# Patient Record
Sex: Male | Born: 1950 | Race: White | Hispanic: No | Marital: Married | Smoking: Former smoker
Health system: Southern US, Community
[De-identification: ages and names within clinical notes are randomized; demographics above are authoritative.]

## PROBLEM LIST (undated history)

## (undated) DIAGNOSIS — I10 Essential (primary) hypertension: Secondary | ICD-10-CM

## (undated) DIAGNOSIS — G4730 Sleep apnea, unspecified: Secondary | ICD-10-CM

## (undated) DIAGNOSIS — K429 Umbilical hernia without obstruction or gangrene: Secondary | ICD-10-CM

## (undated) DIAGNOSIS — Z889 Allergy status to unspecified drugs, medicaments and biological substances status: Secondary | ICD-10-CM

## (undated) DIAGNOSIS — F329 Major depressive disorder, single episode, unspecified: Secondary | ICD-10-CM

## (undated) DIAGNOSIS — E785 Hyperlipidemia, unspecified: Secondary | ICD-10-CM

## (undated) DIAGNOSIS — N419 Inflammatory disease of prostate, unspecified: Secondary | ICD-10-CM

## (undated) DIAGNOSIS — F32A Depression, unspecified: Secondary | ICD-10-CM

## (undated) HISTORY — DX: Hyperlipidemia, unspecified: E78.5

## (undated) HISTORY — PX: CATARACT EXTRACTION: SUR2

## (undated) HISTORY — DX: Sleep apnea, unspecified: G47.30

## (undated) HISTORY — DX: Essential (primary) hypertension: I10

## (undated) HISTORY — PX: EYE SURGERY: SHX253

## (undated) HISTORY — PX: COLONOSCOPY: SHX174

## (undated) HISTORY — PX: TONSILLECTOMY: SUR1361

## (undated) HISTORY — DX: Umbilical hernia without obstruction or gangrene: K42.9

## (undated) HISTORY — PX: HERNIA REPAIR: SHX51

## (undated) HISTORY — PX: APPENDECTOMY: SHX54

---

## 1998-02-18 ENCOUNTER — Ambulatory Visit (HOSPITAL_COMMUNITY): Admission: RE | Admit: 1998-02-18 | Discharge: 1998-02-18 | Payer: Self-pay | Admitting: Internal Medicine

## 1998-02-18 ENCOUNTER — Encounter: Payer: Self-pay | Admitting: Internal Medicine

## 1999-02-18 ENCOUNTER — Encounter: Payer: Self-pay | Admitting: Emergency Medicine

## 1999-02-18 ENCOUNTER — Emergency Department (HOSPITAL_COMMUNITY): Admission: EM | Admit: 1999-02-18 | Discharge: 1999-02-18 | Payer: Self-pay | Admitting: Emergency Medicine

## 2005-12-10 ENCOUNTER — Inpatient Hospital Stay (HOSPITAL_COMMUNITY): Admission: AD | Admit: 2005-12-10 | Discharge: 2005-12-13 | Payer: Self-pay | Admitting: Interventional Cardiology

## 2009-02-14 ENCOUNTER — Ambulatory Visit (HOSPITAL_COMMUNITY): Admission: RE | Admit: 2009-02-14 | Discharge: 2009-02-14 | Payer: Self-pay | Admitting: Gastroenterology

## 2010-07-05 ENCOUNTER — Encounter: Payer: Self-pay | Admitting: Interventional Cardiology

## 2010-10-30 NOTE — Discharge Summary (Signed)
NAMERONDELL, PARDON NO.:  [id number]   MEDICAL RECORD NO.:  [id number]          PATIENT TYPE:  INP   LOCATION:  2036                         FACILITY:  [facility]   PHYSICIAN:  Lyn Records, M.D.   DATE OF BIRTH:  [birth year]   DATE OF ADMISSION:  12/10/2005  DATE OF DISCHARGE:  12/13/2005                                 DISCHARGE SUMMARY   DISCHARGE DIAGNOSES:  1.  Chest pain, felt to be noncardiac in nature.  2.  Hypertension.  3.  Hyperlipidemia.  4.  Depression.  5.  Asthma.  6.  Longterm medication use.   HISTORY AND HOSPITAL COURSE:  Scott Young is a 60 year old male patient who  had 6-month history of chest tightness on exertion as well as at rest.  He  was sent by his primary care physician for a Cardiolite that was negative  for ischemia but he did have exercise induced chest pain.  We were going to  schedule an outpatient cardiac catheterization, however, the patient had  increasing symptoms.  Therefore he was admitted on December 10, 2005 for IV  medication management including heparin and nitroglycerin.  The patient  ultimately had a cardiac catheterization on December 13, 2005 and was found to  have angiographically normal coronary arteries with a normal EF of 60%.  The  patient was closed with Angio-Seal and ready for discharge to home later  that day.   DISCHARGE CONDITION AND MEDICATIONS:  He was discharged to home in stable  condition on the following medications:  1.  Toprol was discontinued.  2.  Prozac as prior to admission.  3.  Altace 10 mg a day.  4.  Coated aspirin 325 mg a day.  5.  Lipitor 10 mg a day.   FOLLOWUP:  He is not to follow up with Dr. Katrinka Blazing unless he has difficulty  with his groin.  He is to follow up with Dr. Earl Gala in the 2 weeks.   DISCHARGE INSTRUCTIONS:  No driving for 2 days.  No lifting over 10 pounds  for 1 week.  Clean cath site gently with soap and water, no scrubbing.  The  patient is discharged  home in stable  condition.      [name], P.A.      Lyn Records, M.D.  Electronically Signed    LB/MEDQ  D:  12/13/2005  T:  12/13/2005  Job:  [id number]

## 2010-10-30 NOTE — Cardiovascular Report (Signed)
NAMEMarland Kitchen  Scott Young, Scott Young NO.:  [id number]   MEDICAL RECORD NO.:  [id number]          PATIENT TYPE:  INP   LOCATION:  2036                         FACILITY:  [facility]   PHYSICIAN:  Lyn Records, M.D.   DATE OF BIRTH:  14-Dec-1950   DATE OF PROCEDURE:  12/13/2005  DATE OF DISCHARGE:                              CARDIAC CATHETERIZATION   INDICATION FOR STUDY:  Exertional and rest chest discomfort compatible with  unstable angina in this 61 year old gentleman with hypertension, history of  depression, and positive family history of coronary disease.   PROCEDURE PERFORMED:  1.  Left heart catheterization.  2.  Selective coronary angiography.  3.  Left ventriculography.  4.  Angio-Seal arteriotomy closure.   DESCRIPTION:  After informed consent a 6-French sheath was placed in the  right femoral artery using a modified Seldinger technique.  A 6-French A2  multipurpose catheter was then used for hemodynamic recordings, left  ventriculography by hand injection, and selective left and right coronary  angiography.  Right coronary angiography was eventually accomplish with a  Judkins right #4 6-French catheter.  Angio-Seal arteriotomy closure was  performed postprocedure with good hemostasis.   RESULTS:  1.  Hemodynamic data:      1.  Aortic pressure 121/72.      2.  Left ventricular pressure 114/17.  2.  Left ventriculography:  LV cavity is faintly opacified.  Overall      function is felt to be normal.  EF 60%.  No regional wall motion      abnormality.  3.  Coronary angiography.      1.  Left main coronary:  Widely patent.      2.  Left anterior descending coronary:  The LAD is widely patent, gives          origin to three diagonal branches.  All are patent.      3.  Circumflex artery:  The circumflex is a moderate size vessel that          gives origin to three obtuse marginals.  Luminal irregularities are          noted in the mid circumflex.  The vessel is widely  patent.      4.  Right coronary:  The right coronary artery is dominant and is          normal.  Gives origin to PDA and two small left ventricular          branches.   CONCLUSIONS:  1.  Essentially normal coronary arteries.  2.  Normal left ventricular function.   PLAN:  Monitor groin.  Home later today if good hemostasis.  Consider GI  workup for chest discomfort.  No further cardiac evaluation.      Lyn Records, M.D.  Electronically Signed     HWS/MEDQ  D:  12/13/2005  T:  12/13/2005  Job:  [id number]   cc:   Theressa Millard, M.D.  Fax: [phone]

## 2010-10-30 NOTE — H&P (Signed)
NAMEMarland Kitchen  ISAC, LINCKS NO.:  [id number]   MEDICAL RECORD NO.:  [id number]          PATIENT TYPE:  INP   LOCATION:  2036                         FACILITY:  [facility]   PHYSICIAN:  Lyn Records, M.D.   DATE OF BIRTH:  22-Jun-1950   DATE OF ADMISSION:  12/10/2005  DATE OF DISCHARGE:                                HISTORY & PHYSICAL   CHIEF COMPLAINT:  Chest pain at rest.   Scott Young is a 60 year old male patient who has a 41-month history of chest  tightness on exertion as well as chest pain at rest. He complains of his  most severe episode on December 09, 2005 while eating lunch. His chest pain was  associated with dyspnea. Spontaneously the pain resolved. Over the past 24  hours he has had recurrent pain. He was encouraged to be seen in the office  by Dr. Katrinka Blazing today, December 10, 2005.   Dr. Earl Gala had ordered a 2-day stress Cardiolite exercise test on December 07, 2005 and there was no evidence of infarction or ischemia, EF was 65%. There  was some basal inferior wall diaphragmatic attenuation although not  suspicious for ischemia. Dr. Katrinka Blazing did state that if the patient did have  chest pain despite the study, we would need to consider coronary  angiography. Since the patient has what appears to be unstable angina  pectoris, we are going to schedule him for cardiac catheterization on December 13, 2005 under the care of Dr. Katrinka Blazing.   PAST MEDICAL HISTORY:  Hypertension, depression, asthma, long-term  medication use, hyperlipidemia.   ALLERGIES:  No known drug allergies.   SOCIAL HISTORY:  No tobacco or alcohol use, he is married.   MEDICATIONS:  1.  Prozac twice weekly.  2.  Altace 2 mg a day.  3.  Toprol XL 25 mg a day.  4.  Enteric coated aspirin 325 mg a day.  5.  Lipitor 10 mg a day.   REVIEW OF SYSTEMS:  No GERD, no recent weight loss, otherwise as above.   PHYSICAL EXAMINATION:  VITAL SIGNS:  Blood pressure 124/80, pulse 55.  HEENT:  Grossly normal, no carotid or  subclavian bruits, JVD or thyromegaly.  Sclera clear, conjunctiva normal, nose without drainage.  CHEST:  Clear to auscultation bilaterally, no wheezes or rhonchi.  HEART:  Regular rate and rhythm. No gross murmur, positive S4.  ABDOMEN:  Soft, nontender, no masses.  EXTREMITIES:  No peripheral edema, palpable lower extremity pulses  bilaterally.  NEUROLOGIC:  The patient is alert and oriented x3.  SKIN:  Warm and dry.   EKG shows sinus bradycardia rate of 53 with no acute ST-T wave changes.   ASSESSMENT:  1.  Progressive angina pectoris even while at rest, with increasing      symptoms. Suspect underlying coronary artery disease.  2.  Hypertension.  3.  Hyperlipidemia.  4.  Depression.  5.  Asthma.  6.  Long-term medication use.   The patient is admitted to the telemetry floor, cardiac catheterization on  Monday. Start IV heparin and serial cardiac isoenzymes.  Scott Young, P.A.      Lyn Records, M.D.  Electronically Signed    LB/MEDQ  D:  12/10/2005  T:  12/10/2005  Job:  [id number]   cc:   Theressa Millard, M.D.  Fax: [phone]

## 2011-09-16 ENCOUNTER — Ambulatory Visit (INDEPENDENT_AMBULATORY_CARE_PROVIDER_SITE_OTHER): Payer: BC Managed Care – PPO | Admitting: General Surgery

## 2011-09-23 ENCOUNTER — Encounter (INDEPENDENT_AMBULATORY_CARE_PROVIDER_SITE_OTHER): Payer: Self-pay | Admitting: General Surgery

## 2011-09-27 ENCOUNTER — Encounter (INDEPENDENT_AMBULATORY_CARE_PROVIDER_SITE_OTHER): Payer: Self-pay | Admitting: General Surgery

## 2011-09-27 ENCOUNTER — Ambulatory Visit (INDEPENDENT_AMBULATORY_CARE_PROVIDER_SITE_OTHER): Payer: PRIVATE HEALTH INSURANCE | Admitting: General Surgery

## 2011-09-27 ENCOUNTER — Telehealth (INDEPENDENT_AMBULATORY_CARE_PROVIDER_SITE_OTHER): Payer: Self-pay

## 2011-09-27 VITALS — BP 124/80 | HR 70 | Temp 98.1°F | Ht 73.0 in | Wt 304.6 lb

## 2011-09-27 DIAGNOSIS — K420 Umbilical hernia with obstruction, without gangrene: Secondary | ICD-10-CM

## 2011-09-27 NOTE — Patient Instructions (Signed)
You have an umbilical hernia. There is no immediate danger, but I cannot completely push this back in.  We will plan to repair your umbilical hernia with mesh this summer.  I will like to see you in the office one to 3 weeks preop for a preop evaluation. Please call  when you know when you want to schedule surgery.

## 2011-09-27 NOTE — Progress Notes (Signed)
Patient ID: Scott Young, male   DOB: 03-Apr-1951, 61 y.o.   MRN: [id number]  Chief Complaint  Patient presents with  . Pre-op Exam    eval hernia    HPI Scott Young is a 61 y.o. male.  He is referred by Dr. Theressa Millard for evaluation of a painful umbilical hernia.  The patient has never had a hernia problem in the past. He has noticed a bulge in his umbilicus for about one year. He says he thinks is getting larger and is becoming somewhat uncomfortable. No history of diffuse abdominal pain or vomiting.  He saw Dr. Earl Gala who referred him for my evaluation.  Comorbidities include obesity ( BMI 40), obstructive sleep apnea, hypertension, hyperlipidemia , but negative cardiac evaluation in the past. HPI  Past Medical History  Diagnosis Date  . Sleep apnea   . Hyperlipidemia   . Hypertension     Past Surgical History  Procedure Date  . Appendectomy   . Tonsillectomy   . Eye surgery     Family History  Problem Relation Age of Onset  . Heart disease Mother   . Cancer Father     colon    Social History History  Substance Use Topics  . Smoking status: Former Games developer  . Smokeless tobacco: Former Neurosurgeon    Quit date: 09/23/1970  . Alcohol Use: No    Allergies  Allergen Reactions  . Codeine     Current Outpatient Prescriptions  Medication Sig Dispense Refill  . aspirin 81 MG tablet Take 81 mg by mouth daily.      . fexofenadine (ALLEGRA) 180 MG tablet Take 180 mg by mouth daily.      Marland Kitchen FLUoxetine (PROZAC) 40 MG capsule Take 40 mg by mouth daily.      Marland Kitchen losartan (COZAAR) 100 MG tablet Take 100 mg by mouth daily.        Review of Systems Review of Systems  Constitutional: Negative for fever, chills and unexpected weight change.  HENT: Negative for hearing loss, congestion, sore throat, trouble swallowing and voice change.   Eyes: Negative for visual disturbance.  Respiratory: Positive for cough. Negative for wheezing.   Cardiovascular: Negative for chest  pain, palpitations and leg swelling.  Gastrointestinal: Positive for abdominal pain and anal bleeding. Negative for nausea, vomiting, diarrhea, constipation, blood in stool, abdominal distention and rectal pain.  Genitourinary: Negative for hematuria and difficulty urinating.  Musculoskeletal: Negative for arthralgias.  Skin: Negative for rash and wound.  Neurological: Negative for seizures, syncope, weakness and headaches.  Hematological: Negative for adenopathy. Does not bruise/bleed easily.  Psychiatric/Behavioral: Negative for confusion.    Blood pressure 124/80, pulse 70, temperature 98.1 F (36.7 C), temperature source Temporal, height 6\' 1"  (1.854 m), weight 304 lb 9.6 oz (138.166 kg), SpO2 97.00%.  Physical Exam Physical Exam  Constitutional: He is oriented to person, place, and time. He appears well-developed and well-nourished. No distress.       obese  HENT:  Head: Normocephalic.  Nose: Nose normal.  Mouth/Throat: No oropharyngeal exudate.  Eyes: Conjunctivae and EOM are normal. Pupils are equal, round, and reactive to light. Right eye exhibits no discharge. Left eye exhibits no discharge. No scleral icterus.  Neck: Normal range of motion. Neck supple. No JVD present. No tracheal deviation present. No thyromegaly present.  Cardiovascular: Normal rate, regular rhythm, normal heart sounds and intact distal pulses.   No murmur heard. Pulmonary/Chest: Effort normal and breath sounds normal. No stridor. No respiratory distress.  He has no wheezes. He has no rales. He exhibits no tenderness.  Abdominal: Soft. Bowel sounds are normal. He exhibits no distension and no mass. There is no tenderness. There is no rebound and no guarding.       Small umbilical hernia, hernia sac 2 cm. Partially but not completely reducible. No evidence of any other abdominal or inguinal hernia or mass. Well-healed right lower quadrant scar from prior appendectomy.  Musculoskeletal: Normal range of motion. He  exhibits no edema and no tenderness.  Lymphadenopathy:    He has no cervical adenopathy.  Neurological: He is alert and oriented to person, place, and time. He has normal reflexes. Coordination normal.  Skin: Skin is warm and dry. No rash noted. He is not diaphoretic. No erythema. No pallor.  Psychiatric: He has a normal mood and affect. His behavior is normal. Judgment and thought content normal.    Data Reviewed I reviewed Dr. Newell Coral office notes.  Assessment    Incarcerated umbilical hernia, small, no apparent complications to date other than pain.  Obesity, BMI 40  Hyperlipidemia   hypertension  Former smoker  History cardiac evaluation 2007 and 2009, essentially negative.    Plan    I discussed the indications, details, techniques, and risks of umbilical hernia repair with mesh with the patient. I told him that eventually this would need to be done as the hernia would almost certainly progress.  He wants  do the surgery this summer and that is very reasonable unless symptoms become worse.   He will call back when he knows when he wants to schedule surgery.  We will see him in the office one to 3 weeks preoperative for a  final exam.       Angelia Mould. Derrell Lolling, M.D., [facility] General and Minimally invasive Surgery Breast and Colorectal Surgery Office:   [phone] Pager:   [phone]  09/27/2011, 4:16 PM

## 2011-09-27 NOTE — Telephone Encounter (Signed)
Surgery orders are written for [facility] and given to Debbie E for to call back file. Pt to call back to have surgery in summer. Pt needs short ov for reck 2 weeks before surgery.

## 2011-11-25 ENCOUNTER — Encounter (INDEPENDENT_AMBULATORY_CARE_PROVIDER_SITE_OTHER): Payer: Self-pay | Admitting: General Surgery

## 2011-11-25 ENCOUNTER — Ambulatory Visit (INDEPENDENT_AMBULATORY_CARE_PROVIDER_SITE_OTHER): Payer: PRIVATE HEALTH INSURANCE | Admitting: General Surgery

## 2011-11-25 VITALS — BP 130/76 | HR 70 | Temp 97.3°F | Resp 14 | Ht 72.0 in | Wt 297.1 lb

## 2011-11-25 DIAGNOSIS — K420 Umbilical hernia with obstruction, without gangrene: Secondary | ICD-10-CM

## 2011-11-25 NOTE — Patient Instructions (Signed)
Your umbilical hernia is relatively small, but there appears to be entrapped fat present. It doesn't appear much different than the last time I saw you in the office.  We will proceed with surgery to repair your  umbilical hernia with mesh on June 28 as scheduled.

## 2011-11-25 NOTE — Progress Notes (Signed)
Patient ID: Scott Young, male   DOB: [birth year], 61 y.o.   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 He is scheduled for elective repair of his umbilical hernia with mesh on June 28.  He states that it seems to protrude a little bit more but is really not having  much pain.  On exam, he is in no distress. Abdomen is morbidly obese. Small umbilical hernia that is not reducible. No infection. Seems to be incarcerated preperitoneal fat.  Assessment: Incarcerated umbilical hernia containing fat only, minimally minimal asymptomatic  Plan: Proceed with surgery to repair his umbilical  hernia with mesh on June 28.   Angelia Mould. Derrell Lolling, M.D., [facility] General and Minimally invasive Surgery Breast and Colorectal Surgery Office:   [phone] Pager:   [phone]

## 2011-12-06 ENCOUNTER — Other Ambulatory Visit (INDEPENDENT_AMBULATORY_CARE_PROVIDER_SITE_OTHER): Payer: Self-pay | Admitting: General Surgery

## 2011-12-07 LAB — COMPREHENSIVE METABOLIC PANEL
ALT: 16 U/L (ref 0–53)
AST: 18 U/L (ref 0–37)
Albumin: 4.3 g/dL (ref 3.5–5.2)
Alkaline Phosphatase: 85 U/L (ref 39–117)
BUN: 13 mg/dL (ref 6–23)
CO2: 31 mEq/L (ref 19–32)
Calcium: 9.6 mg/dL (ref 8.4–10.5)
Chloride: 101 mEq/L (ref 96–112)
Creat: 1.2 mg/dL (ref 0.50–1.35)
Glucose, Bld: 112 mg/dL — ABNORMAL HIGH (ref 70–99)
Potassium: 4.8 mEq/L (ref 3.5–5.3)
Sodium: 138 mEq/L (ref 135–145)
Total Bilirubin: 0.6 mg/dL (ref 0.3–1.2)
Total Protein: 7.1 g/dL (ref 6.0–8.3)

## 2011-12-07 LAB — URINALYSIS, ROUTINE W REFLEX MICROSCOPIC
Bilirubin Urine: NEGATIVE
Glucose, UA: NEGATIVE mg/dL
Hgb urine dipstick: NEGATIVE
Ketones, ur: NEGATIVE mg/dL
Leukocytes, UA: NEGATIVE
Nitrite: NEGATIVE
Protein, ur: NEGATIVE mg/dL
Specific Gravity, Urine: 1.01 (ref 1.005–1.030)
Urobilinogen, UA: 0.2 mg/dL (ref 0.0–1.0)
pH: 6.5 (ref 5.0–8.0)

## 2011-12-10 DIAGNOSIS — K420 Umbilical hernia with obstruction, without gangrene: Secondary | ICD-10-CM

## 2011-12-13 ENCOUNTER — Telehealth (INDEPENDENT_AMBULATORY_CARE_PROVIDER_SITE_OTHER): Payer: Self-pay | Admitting: General Surgery

## 2011-12-13 NOTE — Telephone Encounter (Signed)
Pt is calling to report redness and sensitivity to the tape used to bandage his incision.  There is redness that resembles a sunburn, but not tenderness or itching.  It is located exactly where the tape was, about 4" out from the incision itself.  Advised pt to monitor it for now and call back if worsens or he becomes concerned.  He understands and will comply.

## 2011-12-20 ENCOUNTER — Telehealth (INDEPENDENT_AMBULATORY_CARE_PROVIDER_SITE_OTHER): Payer: Self-pay | Admitting: General Surgery

## 2011-12-20 NOTE — Telephone Encounter (Signed)
Patient called in pertaining to appointment message left last week. I advised the patient that I had to obtain approval from Dr. Derrell Lolling to add him on a specific day and that his message was not forgotten. Appointment made on 01/05/12 at 2:15 before Dr. Jacinto Halim urgent office that day.

## 2012-01-05 ENCOUNTER — Encounter (INDEPENDENT_AMBULATORY_CARE_PROVIDER_SITE_OTHER): Payer: Self-pay | Admitting: General Surgery

## 2012-01-05 ENCOUNTER — Ambulatory Visit (INDEPENDENT_AMBULATORY_CARE_PROVIDER_SITE_OTHER): Payer: PRIVATE HEALTH INSURANCE | Admitting: General Surgery

## 2012-01-05 VITALS — BP 144/78 | HR 76 | Temp 97.8°F | Resp 16 | Ht 73.0 in | Wt 295.0 lb

## 2012-01-05 DIAGNOSIS — K420 Umbilical hernia with obstruction, without gangrene: Secondary | ICD-10-CM

## 2012-01-05 NOTE — Progress Notes (Signed)
Subjective:     Patient ID: Scott Young, male   DOB: 04/25/1951, 61 y.o.   MRN: [id number]  HPI This gentleman underwent open repair of incarcerated umbilical hernia with a 4.3 cm Proceed disc on 12/10/2011. He has done well. He really doesn't have any pain or wound problems. He wants to know when he can resume normal physical activities.  Review of Systems     Objective:   Physical Exam Very pleasant gentleman. Obese. In no distress.  Abdomen obese. Soft. Nontender. Transverse incision below umbilicus well healed. No evidence of recurrent hernia.    Assessment:     Incarcerated umbilical hernia, on it for recovery following repair with mesh    Plan:     Resume normal activities without restriction after August 1.  Return to see me when necessary   Angelia Mould. Derrell Lolling, M.D., [facility] General and Minimally invasive Surgery Breast and Colorectal Surgery Office:   [phone] Pager:   [phone]

## 2012-01-05 NOTE — Patient Instructions (Signed)
Your  umbilical hernia incision appears to be healing without any obvious complications. You may resume all normal activities without restriction after August 1. Return to see Dr. Derrell Lolling if any further problems arise.

## 2014-08-08 ENCOUNTER — Other Ambulatory Visit: Payer: Self-pay | Admitting: Gastroenterology

## 2014-09-30 DIAGNOSIS — R7309 Other abnormal glucose: Secondary | ICD-10-CM | POA: Diagnosis not present

## 2014-10-15 ENCOUNTER — Encounter (HOSPITAL_COMMUNITY): Payer: Self-pay | Admitting: *Deleted

## 2014-10-16 DIAGNOSIS — G4733 Obstructive sleep apnea (adult) (pediatric): Secondary | ICD-10-CM | POA: Diagnosis not present

## 2014-10-16 DIAGNOSIS — R7301 Impaired fasting glucose: Secondary | ICD-10-CM | POA: Diagnosis not present

## 2014-10-16 DIAGNOSIS — I10 Essential (primary) hypertension: Secondary | ICD-10-CM | POA: Diagnosis not present

## 2014-10-16 DIAGNOSIS — E663 Overweight: Secondary | ICD-10-CM | POA: Diagnosis not present

## 2014-10-16 DIAGNOSIS — E780 Pure hypercholesterolemia: Secondary | ICD-10-CM | POA: Diagnosis not present

## 2014-10-16 DIAGNOSIS — Z6841 Body Mass Index (BMI) 40.0 and over, adult: Secondary | ICD-10-CM | POA: Diagnosis not present

## 2014-10-22 ENCOUNTER — Encounter (HOSPITAL_COMMUNITY): Payer: Self-pay | Admitting: Anesthesiology

## 2014-10-22 ENCOUNTER — Ambulatory Visit (HOSPITAL_COMMUNITY): Payer: Medicare Other | Admitting: Anesthesiology

## 2014-10-22 ENCOUNTER — Ambulatory Visit (HOSPITAL_COMMUNITY)
Admission: RE | Admit: 2014-10-22 | Discharge: 2014-10-22 | Disposition: A | Discharge status: Home / Self Care | Payer: Medicare Other | Source: Ambulatory Visit | Attending: Gastroenterology | Admitting: Gastroenterology

## 2014-10-22 ENCOUNTER — Encounter (HOSPITAL_COMMUNITY)
Admission: RE | Disposition: A | Discharge status: Home / Self Care | Payer: Self-pay | Source: Ambulatory Visit | Attending: Gastroenterology

## 2014-10-22 DIAGNOSIS — Z800 Family history of malignant neoplasm of digestive organs: Secondary | ICD-10-CM | POA: Diagnosis not present

## 2014-10-22 DIAGNOSIS — I10 Essential (primary) hypertension: Secondary | ICD-10-CM | POA: Diagnosis not present

## 2014-10-22 DIAGNOSIS — Z1211 Encounter for screening for malignant neoplasm of colon: Secondary | ICD-10-CM | POA: Insufficient documentation

## 2014-10-22 DIAGNOSIS — G4733 Obstructive sleep apnea (adult) (pediatric): Secondary | ICD-10-CM | POA: Diagnosis not present

## 2014-10-22 DIAGNOSIS — Z8371 Family history of colonic polyps: Secondary | ICD-10-CM | POA: Diagnosis not present

## 2014-10-22 HISTORY — PX: COLONOSCOPY WITH PROPOFOL: SHX5780

## 2014-10-22 HISTORY — DX: Depression, unspecified: F32.A

## 2014-10-22 HISTORY — DX: Allergy status to unspecified drugs, medicaments and biological substances: Z88.9

## 2014-10-22 HISTORY — DX: Major depressive disorder, single episode, unspecified: F32.9

## 2014-10-22 SURGERY — COLONOSCOPY WITH PROPOFOL
Anesthesia: Monitor Anesthesia Care

## 2014-10-22 MED ORDER — PROPOFOL 10 MG/ML IV BOLUS
INTRAVENOUS | Status: AC
  Filled 2014-10-22: qty 20

## 2014-10-22 MED ORDER — PROPOFOL INFUSION 10 MG/ML OPTIME
INTRAVENOUS | Status: DC | PRN
  Administered 2014-10-22: 300 ug/kg/min via INTRAVENOUS

## 2014-10-22 MED ORDER — LACTATED RINGERS IV SOLN
INTRAVENOUS | Status: DC
  Administered 2014-10-22: 1000 mL via INTRAVENOUS

## 2014-10-22 MED ORDER — SODIUM CHLORIDE 0.9 % IV SOLN: INTRAVENOUS | Status: DC

## 2014-10-22 SURGICAL SUPPLY — 21 items

## 2014-10-22 NOTE — Op Note (Signed)
Procedure: Screening colonoscopy. Normal diagnostic esophagogastroduodenoscopy and screening colonoscopy performed on 02/14/2009. Father diagnosed with colon cancer in his 6140s.  Endoscopist: Danise EdgeMartin Howell Groesbeck  Premedication: Propofol administered by anesthesia  Procedure: The patient was placed in the left lateral decubitus position. Anal inspection and digital rectal exam were normal. The Pentax pediatric colonoscope was introduced into the rectum and advanced to the cecum. A normal-appearing appendiceal orifice and ileocecal valve were identified. Colonic preparation for the exam today was good. Withdrawal time was 12 minutes  Rectum. Normal. Retroflexed view of the distal rectum normal  Sigmoid colon and descending colon. Normal  Splenic flexure. Normal  Transverse colon. Normal  Hepatic flexure. Normal  Ascending colon. Normal  Cecum and ileocecal valve. Normal  Assessment: Normal screening colonoscopy  Recommendation: Schedule repeat screening colonoscopy in 5 years

## 2014-10-22 NOTE — Anesthesia Preprocedure Evaluation (Signed)
Anesthesia Evaluation  Patient identified by MRN, date of birth, ID band Patient awake    Reviewed: Allergy & Precautions, NPO status , Patient's Chart, lab work & pertinent test results  Airway Mallampati: II  TM Distance: >3 FB Neck ROM: Full    Dental no notable dental hx.    Pulmonary sleep apnea , former smoker,  breath sounds clear to auscultation  Pulmonary exam normal       Cardiovascular hypertension, Pt. on medications negative cardio ROS Normal cardiovascular examRhythm:Regular Rate:Normal     Neuro/Psych PSYCHIATRIC DISORDERS Depression negative neurological ROS     GI/Hepatic negative GI ROS, Neg liver ROS,   Endo/Other  negative endocrine ROS  Renal/GU negative Renal ROS  negative genitourinary   Musculoskeletal negative musculoskeletal ROS (+)   Abdominal   Peds negative pediatric ROS (+)  Hematology negative hematology ROS (+)   Anesthesia Other Findings   Reproductive/Obstetrics negative OB ROS                             Anesthesia Physical Anesthesia Plan  ASA: II  Anesthesia Plan: MAC   Post-op Pain Management:    Induction: Intravenous  Airway Management Planned: Natural Airway  Additional Equipment:   Intra-op Plan:   Post-operative Plan:   Informed Consent: I have reviewed the patients History and Physical, chart, labs and discussed the procedure including the risks, benefits and alternatives for the proposed anesthesia with the patient or authorized representative who has indicated his/her understanding and acceptance.   Dental advisory given  Plan Discussed with: CRNA  Anesthesia Plan Comments:         Anesthesia Quick Evaluation

## 2014-10-22 NOTE — Transfer of Care (Signed)
Immediate Anesthesia Transfer of Care Note  Patient: Scott Young  Procedure(s) Performed: Procedure(s): COLONOSCOPY WITH PROPOFOL (N/A)  Patient Location: PACU and Endoscopy Unit  Anesthesia Type:MAC  Level of Consciousness: awake, alert , oriented and patient cooperative  Airway & Oxygen Therapy: Patient Spontanous Breathing and Patient connected to face mask oxygen  Post-op Assessment: Report given to RN and Post -op Vital signs reviewed and stable  Post vital signs: Reviewed and stable  Last Vitals:  Filed Vitals:   10/22/14 1156  BP: 160/93  Pulse: 65  Temp: 36.6 C    Complications: No apparent anesthesia complications

## 2014-10-22 NOTE — Anesthesia Postprocedure Evaluation (Signed)
  Anesthesia Post-op Note  Patient: Scott Young  Procedure(s) Performed: Procedure(s) (LRB): COLONOSCOPY WITH PROPOFOL (N/A)  Patient Location: PACU  Anesthesia Type: MAC  Level of Consciousness: awake and alert   Airway and Oxygen Therapy: Patient Spontanous Breathing  Post-op Pain: mild  Post-op Assessment: Post-op Vital signs reviewed, Patient's Cardiovascular Status Stable, Respiratory Function Stable, Patent Airway and No signs of Nausea or vomiting  Last Vitals:  Filed Vitals:   10/22/14 1339  BP: 131/82  Pulse: 63  Temp:   Resp: 19    Post-op Vital Signs: stable   Complications: No apparent anesthesia complications

## 2014-10-22 NOTE — H&P (Signed)
  Procedure: Screening colonoscopy. 02/14/2009 normal diagnostic esophagogastroduodenoscopy and screening colonoscopy. Father diagnosed with colon cancer in his 440s  History: The patient is a 64 year old male born [birth year]. He is scheduled to undergo a repeat screening colonoscopy today.  Past medical history: Obstructive sleep apnea syndrome. Hypertension.  Medication allergies: Codeine. Lisinopril causes cough. Statin drugs cause muscle aches.  Exam: The patient is alert and lying comfortably on the endoscopy stretcher. Abdomen is soft and nontender to palpation. Lungs are clear to auscultation. Cardiac exam reveals a regular rhythm.  Plan: Proceed with repeat screening colonoscopy

## 2014-10-23 ENCOUNTER — Encounter (HOSPITAL_COMMUNITY): Payer: Self-pay | Admitting: Gastroenterology

## 2015-01-02 DIAGNOSIS — Z0001 Encounter for general adult medical examination with abnormal findings: Secondary | ICD-10-CM | POA: Diagnosis not present

## 2015-01-02 DIAGNOSIS — R7309 Other abnormal glucose: Secondary | ICD-10-CM | POA: Diagnosis not present

## 2015-01-02 DIAGNOSIS — I10 Essential (primary) hypertension: Secondary | ICD-10-CM | POA: Diagnosis not present

## 2015-01-27 DIAGNOSIS — L0390 Cellulitis, unspecified: Secondary | ICD-10-CM | POA: Diagnosis not present

## 2015-01-31 DIAGNOSIS — L0390 Cellulitis, unspecified: Secondary | ICD-10-CM | POA: Diagnosis not present

## 2015-02-05 DIAGNOSIS — L0103 Bullous impetigo: Secondary | ICD-10-CM | POA: Diagnosis not present

## 2015-06-05 DIAGNOSIS — E663 Overweight: Secondary | ICD-10-CM | POA: Diagnosis not present

## 2015-06-05 DIAGNOSIS — Z6841 Body Mass Index (BMI) 40.0 and over, adult: Secondary | ICD-10-CM | POA: Diagnosis not present

## 2015-06-05 DIAGNOSIS — R7301 Impaired fasting glucose: Secondary | ICD-10-CM | POA: Diagnosis not present

## 2015-06-05 DIAGNOSIS — R350 Frequency of micturition: Secondary | ICD-10-CM | POA: Diagnosis not present

## 2015-06-05 DIAGNOSIS — R319 Hematuria, unspecified: Secondary | ICD-10-CM | POA: Diagnosis not present

## 2015-06-05 DIAGNOSIS — I10 Essential (primary) hypertension: Secondary | ICD-10-CM | POA: Diagnosis not present

## 2015-06-05 DIAGNOSIS — E7800 Pure hypercholesterolemia, unspecified: Secondary | ICD-10-CM | POA: Diagnosis not present

## 2015-06-05 DIAGNOSIS — G4733 Obstructive sleep apnea (adult) (pediatric): Secondary | ICD-10-CM | POA: Diagnosis not present

## 2015-06-25 DIAGNOSIS — R319 Hematuria, unspecified: Secondary | ICD-10-CM | POA: Diagnosis not present

## 2015-10-21 DIAGNOSIS — R5383 Other fatigue: Secondary | ICD-10-CM | POA: Diagnosis not present

## 2015-10-21 DIAGNOSIS — J180 Bronchopneumonia, unspecified organism: Secondary | ICD-10-CM | POA: Diagnosis not present

## 2015-10-21 DIAGNOSIS — R509 Fever, unspecified: Secondary | ICD-10-CM | POA: Diagnosis not present

## 2015-10-21 DIAGNOSIS — R05 Cough: Secondary | ICD-10-CM | POA: Diagnosis not present

## 2015-10-22 ENCOUNTER — Emergency Department (HOSPITAL_COMMUNITY)
Admission: EM | Admit: 2015-10-22 | Discharge: 2015-10-22 | Disposition: A | Discharge status: Home / Self Care | Payer: Medicare Other | Attending: Emergency Medicine | Admitting: Emergency Medicine

## 2015-10-22 ENCOUNTER — Encounter (HOSPITAL_COMMUNITY): Payer: Self-pay | Admitting: Family Medicine

## 2015-10-22 ENCOUNTER — Emergency Department (HOSPITAL_COMMUNITY): Payer: Medicare Other

## 2015-10-22 DIAGNOSIS — R079 Chest pain, unspecified: Secondary | ICD-10-CM | POA: Insufficient documentation

## 2015-10-22 DIAGNOSIS — R0602 Shortness of breath: Secondary | ICD-10-CM | POA: Diagnosis not present

## 2015-10-22 DIAGNOSIS — Z0131 Encounter for examination of blood pressure with abnormal findings: Secondary | ICD-10-CM | POA: Diagnosis not present

## 2015-10-22 DIAGNOSIS — R9431 Abnormal electrocardiogram [ECG] [EKG]: Secondary | ICD-10-CM | POA: Insufficient documentation

## 2015-10-22 DIAGNOSIS — I10 Essential (primary) hypertension: Secondary | ICD-10-CM | POA: Diagnosis not present

## 2015-10-22 DIAGNOSIS — J180 Bronchopneumonia, unspecified organism: Secondary | ICD-10-CM | POA: Diagnosis not present

## 2015-10-22 DIAGNOSIS — R0600 Dyspnea, unspecified: Secondary | ICD-10-CM | POA: Diagnosis not present

## 2015-10-22 LAB — BASIC METABOLIC PANEL
Anion gap: 12 (ref 5–15)
BUN: 11 mg/dL (ref 6–20)
CHLORIDE: 101 mmol/L (ref 101–111)
CO2: 24 mmol/L (ref 22–32)
CREATININE: 1.05 mg/dL (ref 0.61–1.24)
Calcium: 9.3 mg/dL (ref 8.9–10.3)
GFR calc Af Amer: >60 mL/min (ref >60)
GFR calc non Af Amer: >60 mL/min (ref >60)
GLUCOSE: 119 mg/dL — AB (ref 65–99)
POTASSIUM: 4.4 mmol/L (ref 3.5–5.1)
SODIUM: 137 mmol/L (ref 135–145)

## 2015-10-22 LAB — CBC
HEMATOCRIT: 45.3 % (ref 39.0–52.0)
Hemoglobin: 14.9 g/dL (ref 13.0–17.0)
MCH: 29.7 pg (ref 26.0–34.0)
MCHC: 32.9 g/dL (ref 30.0–36.0)
MCV: 90.4 fL (ref 78.0–100.0)
PLATELETS: 243 10*3/uL (ref 150–400)
RBC: 5.01 MIL/uL (ref 4.22–5.81)
RDW: 13 % (ref 11.5–15.5)
WBC: 9.9 10*3/uL (ref 4.0–10.5)

## 2015-10-22 LAB — I-STAT TROPONIN, ED
Troponin i, poc: 0.01 ng/mL (ref 0.00–0.08)
Troponin i, poc: 0 ng/mL (ref 0.00–0.08)

## 2015-10-22 NOTE — ED Notes (Signed)
Spoke with Dr. Clayborne DanaMesner regarding patient wanting to leave.  Instructed the patient he needed to call his cardiologist (Dr Verdis PrimeHenry Smith) in the AM and he and his wife voiced understanding.  Instructed the patient to return if he started feeling worse.

## 2015-10-22 NOTE — ED Notes (Signed)
Pt here for chest pain and SOB. sts that he is currently being treated for PNA but also had abnormal EKG.

## 2015-11-13 ENCOUNTER — Ambulatory Visit (INDEPENDENT_AMBULATORY_CARE_PROVIDER_SITE_OTHER): Payer: Medicare Other | Admitting: Interventional Cardiology

## 2015-11-13 ENCOUNTER — Encounter: Payer: Self-pay | Admitting: Interventional Cardiology

## 2015-11-13 VITALS — BP 136/86 | HR 90 | Ht 72.0 in | Wt 304.4 lb

## 2015-11-13 DIAGNOSIS — R9431 Abnormal electrocardiogram [ECG] [EKG]: Secondary | ICD-10-CM | POA: Diagnosis not present

## 2015-11-13 DIAGNOSIS — R079 Chest pain, unspecified: Secondary | ICD-10-CM | POA: Diagnosis not present

## 2015-11-13 DIAGNOSIS — R0602 Shortness of breath: Secondary | ICD-10-CM | POA: Diagnosis not present

## 2015-11-13 DIAGNOSIS — G4733 Obstructive sleep apnea (adult) (pediatric): Secondary | ICD-10-CM | POA: Insufficient documentation

## 2015-11-13 NOTE — Patient Instructions (Signed)
Medication Instructions:  Your physician recommends that you continue on your current medications as directed. Please refer to the Current Medication list given to you today.   Labwork: Bnp today  Testing/Procedures: Your physician has requested that you have an echocardiogram. Echocardiography is a painless test that uses sound waves to create images of your heart. It provides your doctor with information about the size and shape of your heart and how well your heart's chambers and valves are working. This procedure takes approximately one hour. There are no restrictions for this procedure.  Your physician has requested that you have an exercise tolerance test. For further information please visit [email or web address]. Please also follow instruction sheet, as given.   Follow-Up: Your physician recommends that you schedule a follow-up appointment pending test results   Any Other Special Instructions Will Be Listed Below (If Applicable).     If you need a refill on your cardiac medications before your next appointment, please call your pharmacy.

## 2015-11-13 NOTE — Progress Notes (Signed)
Cardiology Office Note    Date:  11/13/2015   ID:  Scott Young, DOB 1951-05-14, MRN [id number]  PCP:  Katy Apo, MD  Cardiologist: Lesleigh Noe, MD   Chief Complaint  Patient presents with  . Follow-up    Fatigue    History of Present Illness:  Scott Young is a 65 y.o. male for evaluation of dyspnea and chest pressure.  Recent history of pneumonia, fatigue and dyspnea. Had ER visit and told he had abnormal ECG. He was also told he had pneumonia. They recommended that he seek a cardiologist. I'd seen him 10 years ago when he complained of chest discomfort that led to hospitalization and ultimately coronary angiography was normal. He has done well since that time. The recent ER visit in addition to the EKG suggesting a Q-wave inferior infarct of undetermined age was associated with troponin blood work that was negative 2. He really has had more fatigue and dyspnea on exertion than chest discomfort. He is here today for further evaluation if we feel it is necessary.  Risk factors include a known history of hypertension, hyperlipidemia, obesity, sleep apnea, and glucose intolerance. Prior stress nuclear study before coronary angiography demonstrated a relatively fixed inferior wall defect felt secondary to soft tissue attenuation.  Past Medical History  Diagnosis Date  . Hyperlipidemia   . Hypertension   . Umbilical hernia     resolved -s/p surgical repair  . Sleep apnea     cpap  . H/O seasonal allergies     tx. OTC meds  . Depression     Past Surgical History  Procedure Laterality Date  . Appendectomy    . Tonsillectomy    . Hernia repair      umbilical hernia repair  . Cataract extraction Left     30 yrs ago  . Eye surgery      choriziom removed right eye 12'15  . Colonoscopy       multiple- x1 colon polyp removed  . Colonoscopy with propofol N/A 10/22/2014    Procedure: COLONOSCOPY WITH PROPOFOL;  Surgeon: Charolett Bumpers, MD;  Location: [facility];   Service: Endoscopy;  Laterality: N/A;    Current Medications: Outpatient Prescriptions Prior to Visit  Medication Sig Dispense Refill  . aspirin 81 MG tablet Take 81 mg by mouth daily.    . fexofenadine (ALLEGRA) 180 MG tablet Take 180 mg by mouth daily.    Marland Kitchen FLUoxetine (PROZAC) 40 MG capsule Take 40 mg by mouth daily.    Marland Kitchen losartan (COZAAR) 100 MG tablet Take 100 mg by mouth daily.     No facility-administered medications prior to visit.     Allergies:   Codeine   Social History   Social History  . Marital Status: Married    Spouse Name: N/A  . Number of Children: N/A  . Years of Education: N/A   Social History Main Topics  . Smoking status: Former Games developer  . Smokeless tobacco: Former Neurosurgeon    Quit date: 09/23/1970  . Alcohol Use: No  . Drug Use: No  . Sexual Activity: Not Asked   Other Topics Concern  . None   Social History Narrative     Family History:  The patient's family history includes Cancer in his father; Heart disease in his mother.   ROS:   Please see the history of present illness.    Depression, fatigue, and difficulty sleeping  All other systems reviewed and are negative.   PHYSICAL  EXAM:   VS:  BP 136/86 mmHg  Pulse 90  Ht 6' (1.829 m)  Wt 304 lb 6.4 oz (138.075 kg)  BMI 41.28 kg/m2   GEN: Well nourished, well developed, in no acute distress. Morbidly obese HEENT: normal Neck: no JVD, carotid bruits, or masses Cardiac: RRR; no murmurs, rubs, or gallops, trace bilateral lower extremity edema  Respiratory:  clear to auscultation bilaterally, normal work of breathing GI: soft, nontender, nondistended, + BS MS: no deformity or atrophy Skin: warm and dry, no rash Neuro:  Alert and Oriented x 3, Strength and sensation are intact Psych: euthymic mood, full affect  Wt Readings from Last 3 Encounters:  11/13/15 304 lb 6.4 oz (138.075 kg)  10/22/15 301 lb (136.533 kg)  10/22/14 285 lb (129.275 kg)      Studies/Labs Reviewed:   EKG:  EKG   Sinus rhythm with Q waves in lead III and aVF. No tracings for comparison. An EKG description from an H&P in 2007 revealed a normal tracing with the exception of nonspecific ST abnormality.  Recent Labs: 10/22/2015: BUN 11; Creatinine, Ser 1.05; Hemoglobin 14.9; Platelets 243; Potassium 4.4; Sodium 137   Lipid Panel No results found for: CHOL, TRIG, HDL, CHOLHDL, VLDL, LDLCALC, LDLDIRECT  Additional studies/ records that were reviewed today include:   Recent chest x-ray revealed normal heart size. Atelectasis. An recent ER visit revealed normal blood work including 2 normal point of care troponin determinations.    ASSESSMENT:    1. Chest pain at rest   2. Shortness of breath   3. Abnormal EKG   4. OSA (obstructive sleep apnea)      PLAN:  In order of problems listed above:  1. Etiology of the discomfort is uncertain. Prior history of widely patent coronary stenting years ago. 2. This is concerning and could represent diastolic heart failure. There is no history consistent with pulmonary emboli. We'll plan to perform an echocardiogram to assess pulmonary artery pressure, right and left ventricular function, and also to rule out regional wall motion abnormality. 3. The exercise treadmill test will help evaluate for evidence of ischemia in this setting where there are inferior Q waves. 4. Encouraged to maintain compliance with C Pap.    Medication Adjustments/Labs and Tests Ordered: Current medicines are reviewed at length with the patient today.  Concerns regarding medicines are outlined above.  Medication changes, Labs and Tests ordered today are listed in the Patient Instructions below. There are no Patient Instructions on file for this visit.   Signed, Lesleigh NoeHenry W Sharniece Gibbon III, MD  11/13/2015 3:52 PM    [facility] [address], [place], [place]  [address] Phone: [phone](336) [phone]; Fax: [phone]

## 2015-11-14 ENCOUNTER — Telehealth: Payer: Self-pay | Admitting: Interventional Cardiology

## 2015-11-14 LAB — BRAIN NATRIURETIC PEPTIDE: BRAIN NATRIURETIC PEPTIDE: 14 pg/mL (ref <100)

## 2015-11-14 NOTE — Telephone Encounter (Signed)
New message ° ° ° ° ° ° °Calling to get lab results °

## 2015-11-14 NOTE — Telephone Encounter (Signed)
Mailbox full, unable to leave a message. 

## 2015-11-14 NOTE — Telephone Encounter (Signed)
Mailbox full, unable to leave a message at home phone number listed.

## 2015-11-14 NOTE — Telephone Encounter (Signed)
Pt advised BNP done 11/13/15 normal.

## 2015-11-14 NOTE — Telephone Encounter (Signed)
Follow-up    The wife is retuning the phone call please call home phone

## 2015-11-24 ENCOUNTER — Encounter: Payer: Self-pay | Admitting: Interventional Cardiology

## 2015-12-03 ENCOUNTER — Other Ambulatory Visit: Payer: Self-pay

## 2015-12-03 ENCOUNTER — Ambulatory Visit (HOSPITAL_COMMUNITY): Payer: Medicare Other | Attending: Cardiovascular Disease

## 2015-12-03 ENCOUNTER — Ambulatory Visit (INDEPENDENT_AMBULATORY_CARE_PROVIDER_SITE_OTHER): Payer: Medicare Other

## 2015-12-03 DIAGNOSIS — R079 Chest pain, unspecified: Secondary | ICD-10-CM | POA: Diagnosis not present

## 2015-12-03 DIAGNOSIS — Z8249 Family history of ischemic heart disease and other diseases of the circulatory system: Secondary | ICD-10-CM | POA: Insufficient documentation

## 2015-12-03 DIAGNOSIS — G4733 Obstructive sleep apnea (adult) (pediatric): Secondary | ICD-10-CM | POA: Insufficient documentation

## 2015-12-03 DIAGNOSIS — E785 Hyperlipidemia, unspecified: Secondary | ICD-10-CM | POA: Diagnosis not present

## 2015-12-03 DIAGNOSIS — R0602 Shortness of breath: Secondary | ICD-10-CM | POA: Diagnosis not present

## 2015-12-03 DIAGNOSIS — Z87891 Personal history of nicotine dependence: Secondary | ICD-10-CM | POA: Diagnosis not present

## 2015-12-03 DIAGNOSIS — I119 Hypertensive heart disease without heart failure: Secondary | ICD-10-CM | POA: Diagnosis not present

## 2015-12-03 DIAGNOSIS — I340 Nonrheumatic mitral (valve) insufficiency: Secondary | ICD-10-CM | POA: Insufficient documentation

## 2015-12-03 DIAGNOSIS — R0600 Dyspnea, unspecified: Secondary | ICD-10-CM | POA: Diagnosis present

## 2015-12-03 LAB — EXERCISE TOLERANCE TEST
CHL CUP MPHR: 154 {beats}/min
CHL CUP RESTING HR STRESS: 63 {beats}/min
CSEPHR: 79 %
Estimated workload: 7 METS
Exercise duration (min): 6 min
Exercise duration (sec): 1 s
Peak HR: 122 {beats}/min
RPE: 17

## 2015-12-03 LAB — ECHOCARDIOGRAM COMPLETE
Ao-asc: 34 cm
CHL CUP DOP CALC LVOT VTI: 21.2 cm
CHL CUP MV DEC (S): 292
E/e' ratio: 12.38
EWDT: 292 ms
FS: 30 % (ref 28–44)
IV/PV OW: 0.97
LA ID, A-P, ES: 38 mm
LA diam end sys: 38 mm
LA diam index: 1.4 cm/m2
LA vol: 54 mL
LAVOLA4C: 50.4 mL
LAVOLIN: 19.9 mL/m2
LDCA: 5.73 cm2
LV E/e'average: 12.38
LV TDI E'LATERAL: 8.64
LV e' LATERAL: 8.64 cm/s
LVEEMED: 12.38
LVOT SV: 121 mL
LVOT diameter: 27 mm
LVOTPV: 103 cm/s
MV Peak grad: 5 mmHg
MV pk A vel: 97.7 m/s
MV pk E vel: 107 m/s
PW: 13.3 mm — AB (ref 0.6–1.1)
TAPSE: 26 mm
TDI e' medial: 7.07

## 2015-12-04 ENCOUNTER — Telehealth: Payer: Self-pay | Admitting: Interventional Cardiology

## 2015-12-04 NOTE — Telephone Encounter (Signed)
Pt's wife calling requesting  call regarding BP issue-was unable to finish stress yesterday due to his BP-wants to know the next step-ok to call him at home or wife on cell

## 2015-12-04 NOTE — Telephone Encounter (Signed)
Tried to call wife back, DPR on file.  No answer and voicemail is full. Will try again later.

## 2015-12-05 NOTE — Telephone Encounter (Signed)
Follow-up      The wife wants to know what the Echo are.

## 2015-12-05 NOTE — Telephone Encounter (Signed)
Reviewed results with wife who will notify pt.  He will call back if further questions or concerns.

## 2015-12-05 NOTE — Telephone Encounter (Signed)
NA/Mailbox is full - unable to leave message.

## 2015-12-18 DIAGNOSIS — M1710 Unilateral primary osteoarthritis, unspecified knee: Secondary | ICD-10-CM | POA: Diagnosis not present

## 2015-12-18 DIAGNOSIS — M25562 Pain in left knee: Secondary | ICD-10-CM | POA: Diagnosis not present

## 2015-12-23 ENCOUNTER — Encounter: Payer: Self-pay | Admitting: Interventional Cardiology

## 2016-01-13 DIAGNOSIS — Z049 Encounter for examination and observation for unspecified reason: Secondary | ICD-10-CM | POA: Diagnosis not present

## 2016-02-26 DIAGNOSIS — E663 Overweight: Secondary | ICD-10-CM | POA: Diagnosis not present

## 2016-02-26 DIAGNOSIS — I10 Essential (primary) hypertension: Secondary | ICD-10-CM | POA: Diagnosis not present

## 2016-02-26 DIAGNOSIS — F3341 Major depressive disorder, recurrent, in partial remission: Secondary | ICD-10-CM | POA: Diagnosis not present

## 2016-02-26 DIAGNOSIS — Z0000 Encounter for general adult medical examination without abnormal findings: Secondary | ICD-10-CM | POA: Diagnosis not present

## 2016-02-26 DIAGNOSIS — Z6841 Body Mass Index (BMI) 40.0 and over, adult: Secondary | ICD-10-CM | POA: Diagnosis not present

## 2016-02-26 DIAGNOSIS — Z800 Family history of malignant neoplasm of digestive organs: Secondary | ICD-10-CM | POA: Diagnosis not present

## 2016-02-26 DIAGNOSIS — Z23 Encounter for immunization: Secondary | ICD-10-CM | POA: Diagnosis not present

## 2016-02-26 DIAGNOSIS — R7301 Impaired fasting glucose: Secondary | ICD-10-CM | POA: Diagnosis not present

## 2016-02-26 DIAGNOSIS — Z125 Encounter for screening for malignant neoplasm of prostate: Secondary | ICD-10-CM | POA: Diagnosis not present

## 2016-02-26 DIAGNOSIS — Z1389 Encounter for screening for other disorder: Secondary | ICD-10-CM | POA: Diagnosis not present

## 2016-04-09 DIAGNOSIS — F419 Anxiety disorder, unspecified: Secondary | ICD-10-CM | POA: Diagnosis not present

## 2016-05-24 DIAGNOSIS — E1165 Type 2 diabetes mellitus with hyperglycemia: Secondary | ICD-10-CM | POA: Diagnosis not present

## 2016-05-24 DIAGNOSIS — F3341 Major depressive disorder, recurrent, in partial remission: Secondary | ICD-10-CM | POA: Diagnosis not present

## 2016-05-24 DIAGNOSIS — Z6841 Body Mass Index (BMI) 40.0 and over, adult: Secondary | ICD-10-CM | POA: Diagnosis not present

## 2016-05-24 DIAGNOSIS — Z7984 Long term (current) use of oral hypoglycemic drugs: Secondary | ICD-10-CM | POA: Diagnosis not present

## 2016-05-24 DIAGNOSIS — I10 Essential (primary) hypertension: Secondary | ICD-10-CM | POA: Diagnosis not present

## 2016-05-24 DIAGNOSIS — E7800 Pure hypercholesterolemia, unspecified: Secondary | ICD-10-CM | POA: Diagnosis not present

## 2016-05-24 DIAGNOSIS — E119 Type 2 diabetes mellitus without complications: Secondary | ICD-10-CM | POA: Diagnosis not present

## 2016-05-24 DIAGNOSIS — Z794 Long term (current) use of insulin: Secondary | ICD-10-CM | POA: Diagnosis not present

## 2016-05-24 DIAGNOSIS — E663 Overweight: Secondary | ICD-10-CM | POA: Diagnosis not present

## 2016-06-09 DIAGNOSIS — F419 Anxiety disorder, unspecified: Secondary | ICD-10-CM | POA: Diagnosis not present

## 2016-07-21 IMAGING — CR DG CHEST 2V
2 series · 2 of 2 positions shown · non-contrast
Comparison: 12/11/2005

CLINICAL DATA: Shortness of breath for 3 days

EXAM:
CHEST  2 VIEW

[chest pa]
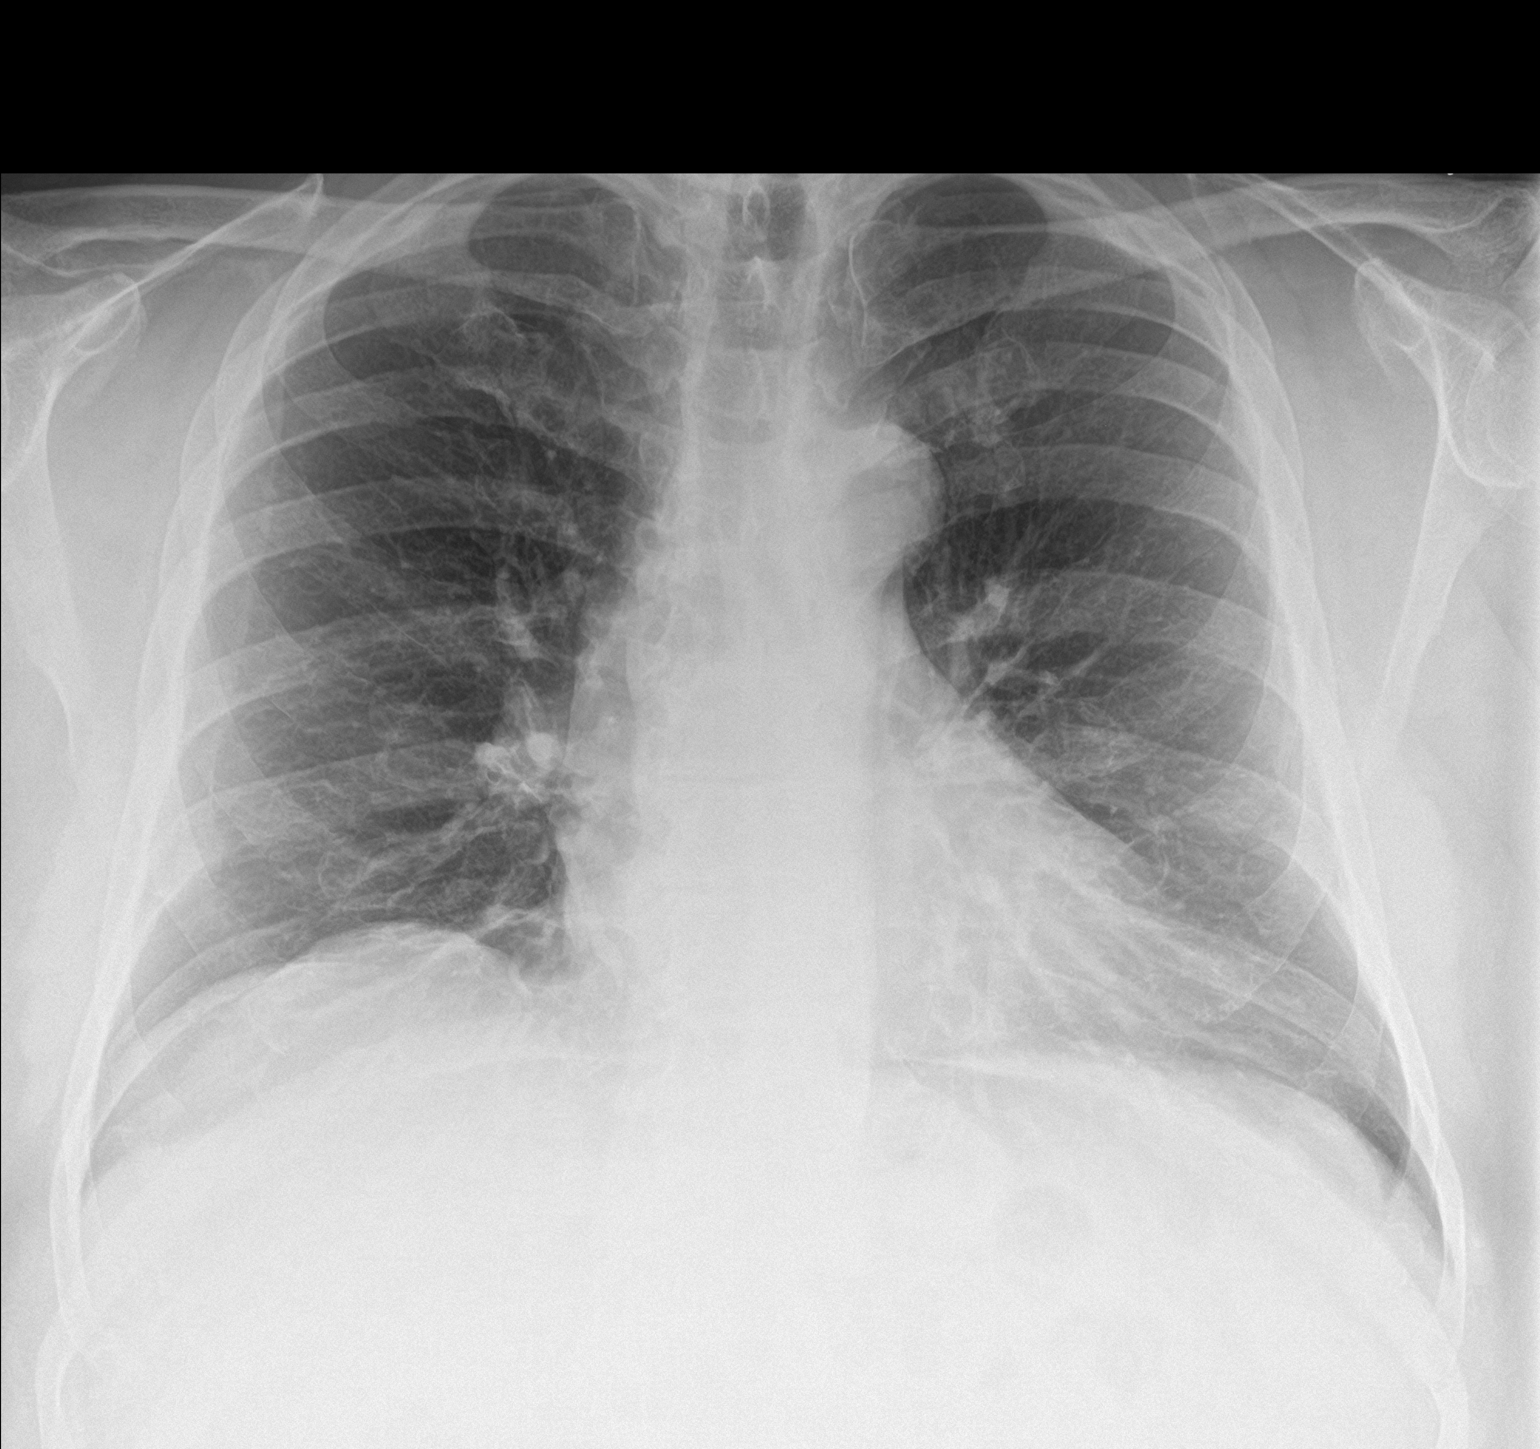

[chest lat]
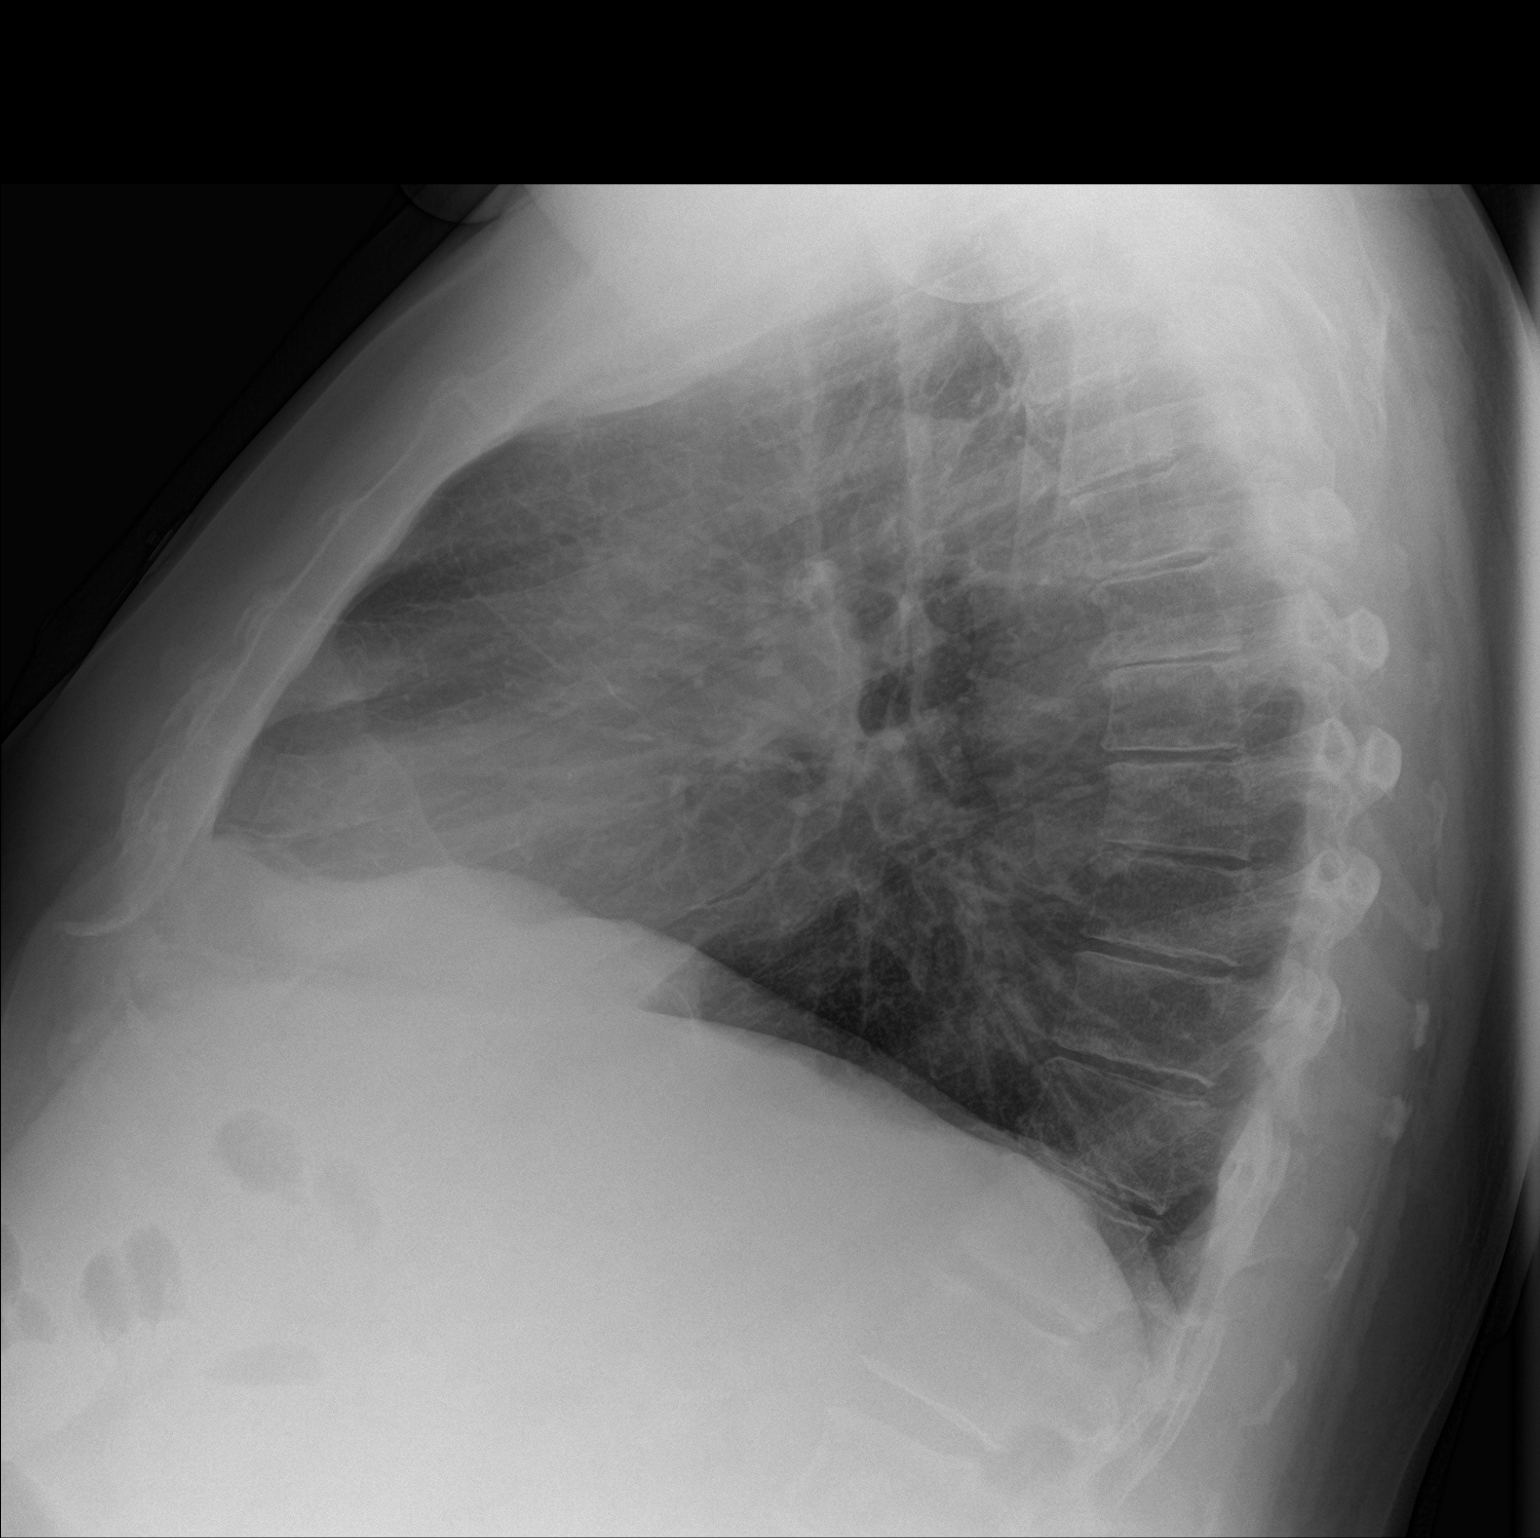

[2 of 2 positions shown; findings below may reference images not displayed]

FINDINGS: Cardiomediastinal silhouette is stable. No acute infiltrate or
pleural effusion. No pulmonary edema. Mild degenerative changes
thoracic spine. Stable streaky bilateral basilar atelectasis or
scarring.
IMPRESSION: No active disease. Stable streaky bilateral basilar atelectasis or
scarring.

## 2016-08-26 DIAGNOSIS — F3341 Major depressive disorder, recurrent, in partial remission: Secondary | ICD-10-CM | POA: Diagnosis not present

## 2016-08-26 DIAGNOSIS — E663 Overweight: Secondary | ICD-10-CM | POA: Diagnosis not present

## 2016-08-26 DIAGNOSIS — E7800 Pure hypercholesterolemia, unspecified: Secondary | ICD-10-CM | POA: Diagnosis not present

## 2016-08-26 DIAGNOSIS — Z6839 Body mass index (BMI) 39.0-39.9, adult: Secondary | ICD-10-CM | POA: Diagnosis not present

## 2016-08-26 DIAGNOSIS — Z7984 Long term (current) use of oral hypoglycemic drugs: Secondary | ICD-10-CM | POA: Diagnosis not present

## 2016-08-26 DIAGNOSIS — I10 Essential (primary) hypertension: Secondary | ICD-10-CM | POA: Diagnosis not present

## 2016-08-26 DIAGNOSIS — E119 Type 2 diabetes mellitus without complications: Secondary | ICD-10-CM | POA: Diagnosis not present

## 2016-10-27 DIAGNOSIS — E119 Type 2 diabetes mellitus without complications: Secondary | ICD-10-CM | POA: Diagnosis not present

## 2016-10-27 DIAGNOSIS — L255 Unspecified contact dermatitis due to plants, except food: Secondary | ICD-10-CM | POA: Diagnosis not present

## 2016-10-27 DIAGNOSIS — L259 Unspecified contact dermatitis, unspecified cause: Secondary | ICD-10-CM | POA: Diagnosis not present

## 2016-10-27 DIAGNOSIS — L0390 Cellulitis, unspecified: Secondary | ICD-10-CM | POA: Diagnosis not present

## 2017-03-10 DIAGNOSIS — E7800 Pure hypercholesterolemia, unspecified: Secondary | ICD-10-CM | POA: Diagnosis not present

## 2017-03-10 DIAGNOSIS — Z125 Encounter for screening for malignant neoplasm of prostate: Secondary | ICD-10-CM | POA: Diagnosis not present

## 2017-03-10 DIAGNOSIS — Z7984 Long term (current) use of oral hypoglycemic drugs: Secondary | ICD-10-CM | POA: Diagnosis not present

## 2017-03-10 DIAGNOSIS — G4733 Obstructive sleep apnea (adult) (pediatric): Secondary | ICD-10-CM | POA: Diagnosis not present

## 2017-03-10 DIAGNOSIS — Z6839 Body mass index (BMI) 39.0-39.9, adult: Secondary | ICD-10-CM | POA: Diagnosis not present

## 2017-03-10 DIAGNOSIS — Z0000 Encounter for general adult medical examination without abnormal findings: Secondary | ICD-10-CM | POA: Diagnosis not present

## 2017-03-10 DIAGNOSIS — E119 Type 2 diabetes mellitus without complications: Secondary | ICD-10-CM | POA: Diagnosis not present

## 2017-03-10 DIAGNOSIS — Z1389 Encounter for screening for other disorder: Secondary | ICD-10-CM | POA: Diagnosis not present

## 2017-03-10 DIAGNOSIS — E663 Overweight: Secondary | ICD-10-CM | POA: Diagnosis not present

## 2017-03-10 DIAGNOSIS — I10 Essential (primary) hypertension: Secondary | ICD-10-CM | POA: Diagnosis not present

## 2017-03-17 DIAGNOSIS — Z0131 Encounter for examination of blood pressure with abnormal findings: Secondary | ICD-10-CM | POA: Diagnosis not present

## 2017-03-17 DIAGNOSIS — L259 Unspecified contact dermatitis, unspecified cause: Secondary | ICD-10-CM | POA: Diagnosis not present

## 2017-03-17 DIAGNOSIS — D649 Anemia, unspecified: Secondary | ICD-10-CM | POA: Diagnosis not present

## 2017-03-22 DIAGNOSIS — L259 Unspecified contact dermatitis, unspecified cause: Secondary | ICD-10-CM | POA: Diagnosis not present

## 2017-03-22 DIAGNOSIS — L299 Pruritus, unspecified: Secondary | ICD-10-CM | POA: Diagnosis not present

## 2017-03-29 DIAGNOSIS — F419 Anxiety disorder, unspecified: Secondary | ICD-10-CM | POA: Diagnosis not present

## 2017-03-31 DIAGNOSIS — E119 Type 2 diabetes mellitus without complications: Secondary | ICD-10-CM | POA: Diagnosis not present

## 2017-03-31 DIAGNOSIS — R319 Hematuria, unspecified: Secondary | ICD-10-CM | POA: Diagnosis not present

## 2017-03-31 DIAGNOSIS — I10 Essential (primary) hypertension: Secondary | ICD-10-CM | POA: Diagnosis not present

## 2017-03-31 DIAGNOSIS — Z79899 Other long term (current) drug therapy: Secondary | ICD-10-CM | POA: Diagnosis not present

## 2017-03-31 DIAGNOSIS — R109 Unspecified abdominal pain: Secondary | ICD-10-CM | POA: Diagnosis not present

## 2017-03-31 DIAGNOSIS — M549 Dorsalgia, unspecified: Secondary | ICD-10-CM | POA: Diagnosis not present

## 2017-04-05 DIAGNOSIS — Z23 Encounter for immunization: Secondary | ICD-10-CM | POA: Diagnosis not present

## 2017-04-11 DIAGNOSIS — N401 Enlarged prostate with lower urinary tract symptoms: Secondary | ICD-10-CM | POA: Diagnosis not present

## 2017-04-11 DIAGNOSIS — R3912 Poor urinary stream: Secondary | ICD-10-CM | POA: Diagnosis not present

## 2017-04-11 DIAGNOSIS — R1084 Generalized abdominal pain: Secondary | ICD-10-CM | POA: Diagnosis not present

## 2017-05-26 DIAGNOSIS — Z0131 Encounter for examination of blood pressure with abnormal findings: Secondary | ICD-10-CM | POA: Diagnosis not present

## 2017-05-26 DIAGNOSIS — J019 Acute sinusitis, unspecified: Secondary | ICD-10-CM | POA: Diagnosis not present

## 2017-05-26 DIAGNOSIS — J029 Acute pharyngitis, unspecified: Secondary | ICD-10-CM | POA: Diagnosis not present

## 2017-05-26 DIAGNOSIS — J209 Acute bronchitis, unspecified: Secondary | ICD-10-CM | POA: Diagnosis not present

## 2017-09-08 DIAGNOSIS — G4733 Obstructive sleep apnea (adult) (pediatric): Secondary | ICD-10-CM | POA: Diagnosis not present

## 2017-09-08 DIAGNOSIS — I10 Essential (primary) hypertension: Secondary | ICD-10-CM | POA: Diagnosis not present

## 2017-09-08 DIAGNOSIS — E7800 Pure hypercholesterolemia, unspecified: Secondary | ICD-10-CM | POA: Diagnosis not present

## 2017-09-08 DIAGNOSIS — E119 Type 2 diabetes mellitus without complications: Secondary | ICD-10-CM | POA: Diagnosis not present

## 2017-09-23 DIAGNOSIS — M79669 Pain in unspecified lower leg: Secondary | ICD-10-CM | POA: Diagnosis not present

## 2017-09-23 DIAGNOSIS — M79606 Pain in leg, unspecified: Secondary | ICD-10-CM | POA: Diagnosis not present

## 2017-09-23 DIAGNOSIS — R2242 Localized swelling, mass and lump, left lower limb: Secondary | ICD-10-CM | POA: Diagnosis not present

## 2017-09-23 DIAGNOSIS — R600 Localized edema: Secondary | ICD-10-CM | POA: Diagnosis not present

## 2017-09-23 DIAGNOSIS — M79605 Pain in left leg: Secondary | ICD-10-CM | POA: Diagnosis not present

## 2017-09-28 DIAGNOSIS — M1710 Unilateral primary osteoarthritis, unspecified knee: Secondary | ICD-10-CM | POA: Diagnosis not present

## 2017-09-28 DIAGNOSIS — M25562 Pain in left knee: Secondary | ICD-10-CM | POA: Diagnosis not present

## 2017-11-17 DIAGNOSIS — L299 Pruritus, unspecified: Secondary | ICD-10-CM | POA: Diagnosis not present

## 2017-11-17 DIAGNOSIS — L259 Unspecified contact dermatitis, unspecified cause: Secondary | ICD-10-CM | POA: Diagnosis not present

## 2017-11-28 DIAGNOSIS — H17821 Peripheral opacity of cornea, right eye: Secondary | ICD-10-CM | POA: Diagnosis not present

## 2017-11-28 DIAGNOSIS — H52211 Irregular astigmatism, right eye: Secondary | ICD-10-CM | POA: Diagnosis not present

## 2017-11-28 DIAGNOSIS — H43393 Other vitreous opacities, bilateral: Secondary | ICD-10-CM | POA: Diagnosis not present

## 2017-11-28 DIAGNOSIS — E119 Type 2 diabetes mellitus without complications: Secondary | ICD-10-CM | POA: Diagnosis not present

## 2017-11-28 DIAGNOSIS — H2511 Age-related nuclear cataract, right eye: Secondary | ICD-10-CM | POA: Diagnosis not present

## 2017-11-28 DIAGNOSIS — Z961 Presence of intraocular lens: Secondary | ICD-10-CM | POA: Diagnosis not present

## 2017-12-06 DIAGNOSIS — F419 Anxiety disorder, unspecified: Secondary | ICD-10-CM | POA: Diagnosis not present

## 2018-03-23 DIAGNOSIS — Z0000 Encounter for general adult medical examination without abnormal findings: Secondary | ICD-10-CM | POA: Diagnosis not present

## 2018-03-23 DIAGNOSIS — E7800 Pure hypercholesterolemia, unspecified: Secondary | ICD-10-CM | POA: Diagnosis not present

## 2018-03-23 DIAGNOSIS — I10 Essential (primary) hypertension: Secondary | ICD-10-CM | POA: Diagnosis not present

## 2018-03-23 DIAGNOSIS — Z1389 Encounter for screening for other disorder: Secondary | ICD-10-CM | POA: Diagnosis not present

## 2018-03-23 DIAGNOSIS — F3341 Major depressive disorder, recurrent, in partial remission: Secondary | ICD-10-CM | POA: Diagnosis not present

## 2018-03-23 DIAGNOSIS — G4733 Obstructive sleep apnea (adult) (pediatric): Secondary | ICD-10-CM | POA: Diagnosis not present

## 2018-03-23 DIAGNOSIS — Z23 Encounter for immunization: Secondary | ICD-10-CM | POA: Diagnosis not present

## 2018-03-23 DIAGNOSIS — E1169 Type 2 diabetes mellitus with other specified complication: Secondary | ICD-10-CM | POA: Diagnosis not present

## 2018-06-09 DIAGNOSIS — M545 Low back pain: Secondary | ICD-10-CM | POA: Diagnosis not present

## 2018-06-09 DIAGNOSIS — R5383 Other fatigue: Secondary | ICD-10-CM | POA: Diagnosis not present

## 2018-06-09 DIAGNOSIS — R300 Dysuria: Secondary | ICD-10-CM | POA: Diagnosis not present

## 2018-06-09 DIAGNOSIS — R319 Hematuria, unspecified: Secondary | ICD-10-CM | POA: Diagnosis not present

## 2018-06-09 DIAGNOSIS — R3121 Asymptomatic microscopic hematuria: Secondary | ICD-10-CM | POA: Diagnosis not present

## 2018-06-22 ENCOUNTER — Other Ambulatory Visit: Payer: Self-pay | Admitting: Internal Medicine

## 2018-06-22 ENCOUNTER — Ambulatory Visit
Admission: RE | Admit: 2018-06-22 | Discharge: 2018-06-22 | Disposition: A | Discharge status: Home / Self Care | Payer: Medicare Other | Source: Ambulatory Visit | Attending: Internal Medicine | Admitting: Internal Medicine

## 2018-06-22 DIAGNOSIS — R109 Unspecified abdominal pain: Secondary | ICD-10-CM

## 2018-06-22 DIAGNOSIS — R319 Hematuria, unspecified: Secondary | ICD-10-CM | POA: Diagnosis not present

## 2018-06-30 DIAGNOSIS — R109 Unspecified abdominal pain: Secondary | ICD-10-CM | POA: Diagnosis not present

## 2018-06-30 DIAGNOSIS — N410 Acute prostatitis: Secondary | ICD-10-CM | POA: Diagnosis not present

## 2018-06-30 DIAGNOSIS — R350 Frequency of micturition: Secondary | ICD-10-CM | POA: Diagnosis not present

## 2018-06-30 DIAGNOSIS — R1031 Right lower quadrant pain: Secondary | ICD-10-CM | POA: Diagnosis not present

## 2018-07-03 ENCOUNTER — Emergency Department (HOSPITAL_COMMUNITY): Payer: Medicare Other

## 2018-07-03 ENCOUNTER — Encounter (HOSPITAL_COMMUNITY): Payer: Self-pay | Admitting: Emergency Medicine

## 2018-07-03 ENCOUNTER — Observation Stay (HOSPITAL_COMMUNITY): Payer: Medicare Other

## 2018-07-03 ENCOUNTER — Other Ambulatory Visit: Payer: Self-pay

## 2018-07-03 ENCOUNTER — Inpatient Hospital Stay (HOSPITAL_COMMUNITY)
Admission: EM | Admit: 2018-07-03 | Discharge: 2018-07-05 | DRG: 287 | Disposition: A | Discharge status: Home / Self Care | Payer: Medicare Other | Attending: Internal Medicine | Admitting: Internal Medicine

## 2018-07-03 DIAGNOSIS — Z7952 Long term (current) use of systemic steroids: Secondary | ICD-10-CM

## 2018-07-03 DIAGNOSIS — I10 Essential (primary) hypertension: Secondary | ICD-10-CM | POA: Diagnosis not present

## 2018-07-03 DIAGNOSIS — I209 Angina pectoris, unspecified: Secondary | ICD-10-CM

## 2018-07-03 DIAGNOSIS — Z7984 Long term (current) use of oral hypoglycemic drugs: Secondary | ICD-10-CM | POA: Diagnosis not present

## 2018-07-03 DIAGNOSIS — G4733 Obstructive sleep apnea (adult) (pediatric): Secondary | ICD-10-CM | POA: Diagnosis present

## 2018-07-03 DIAGNOSIS — Z79899 Other long term (current) drug therapy: Secondary | ICD-10-CM

## 2018-07-03 DIAGNOSIS — N179 Acute kidney failure, unspecified: Secondary | ICD-10-CM | POA: Diagnosis not present

## 2018-07-03 DIAGNOSIS — F329 Major depressive disorder, single episode, unspecified: Secondary | ICD-10-CM | POA: Diagnosis not present

## 2018-07-03 DIAGNOSIS — E119 Type 2 diabetes mellitus without complications: Secondary | ICD-10-CM | POA: Diagnosis not present

## 2018-07-03 DIAGNOSIS — E785 Hyperlipidemia, unspecified: Secondary | ICD-10-CM | POA: Diagnosis not present

## 2018-07-03 DIAGNOSIS — Z87891 Personal history of nicotine dependence: Secondary | ICD-10-CM

## 2018-07-03 DIAGNOSIS — Z6838 Body mass index (BMI) 38.0-38.9, adult: Secondary | ICD-10-CM

## 2018-07-03 DIAGNOSIS — Z7982 Long term (current) use of aspirin: Secondary | ICD-10-CM

## 2018-07-03 DIAGNOSIS — R42 Dizziness and giddiness: Secondary | ICD-10-CM | POA: Diagnosis not present

## 2018-07-03 DIAGNOSIS — R079 Chest pain, unspecified: Secondary | ICD-10-CM | POA: Diagnosis not present

## 2018-07-03 DIAGNOSIS — R112 Nausea with vomiting, unspecified: Secondary | ICD-10-CM | POA: Diagnosis not present

## 2018-07-03 DIAGNOSIS — I25119 Atherosclerotic heart disease of native coronary artery with unspecified angina pectoris: Principal | ICD-10-CM | POA: Diagnosis present

## 2018-07-03 DIAGNOSIS — N419 Inflammatory disease of prostate, unspecified: Secondary | ICD-10-CM

## 2018-07-03 DIAGNOSIS — R0789 Other chest pain: Secondary | ICD-10-CM | POA: Diagnosis not present

## 2018-07-03 DIAGNOSIS — R110 Nausea: Secondary | ICD-10-CM | POA: Diagnosis not present

## 2018-07-03 DIAGNOSIS — Z8249 Family history of ischemic heart disease and other diseases of the circulatory system: Secondary | ICD-10-CM

## 2018-07-03 DIAGNOSIS — E669 Obesity, unspecified: Secondary | ICD-10-CM | POA: Diagnosis present

## 2018-07-03 DIAGNOSIS — E7800 Pure hypercholesterolemia, unspecified: Secondary | ICD-10-CM | POA: Diagnosis present

## 2018-07-03 DIAGNOSIS — N410 Acute prostatitis: Secondary | ICD-10-CM | POA: Diagnosis present

## 2018-07-03 DIAGNOSIS — Z7951 Long term (current) use of inhaled steroids: Secondary | ICD-10-CM

## 2018-07-03 DIAGNOSIS — F32A Depression, unspecified: Secondary | ICD-10-CM

## 2018-07-03 HISTORY — DX: Inflammatory disease of prostate, unspecified: N41.9

## 2018-07-03 LAB — CBC WITH DIFFERENTIAL/PLATELET
ABS IMMATURE GRANULOCYTES: 0.03 10*3/uL (ref 0.00–0.07)
BASOS PCT: 1 %
Basophils Absolute: 0 10*3/uL (ref 0.0–0.1)
Eosinophils Absolute: 0.1 10*3/uL (ref 0.0–0.5)
Eosinophils Relative: 2 %
HCT: 41.3 % (ref 39.0–52.0)
Hemoglobin: 13.5 g/dL (ref 13.0–17.0)
IMMATURE GRANULOCYTES: 0 %
Lymphocytes Relative: 25 %
Lymphs Abs: 2 10*3/uL (ref 0.7–4.0)
MCH: 30 pg (ref 26.0–34.0)
MCHC: 32.7 g/dL (ref 30.0–36.0)
MCV: 91.8 fL (ref 80.0–100.0)
Monocytes Absolute: 0.5 10*3/uL (ref 0.1–1.0)
Monocytes Relative: 6 %
NEUTROS ABS: 5.1 10*3/uL (ref 1.7–7.7)
NEUTROS PCT: 66 %
PLATELETS: 205 10*3/uL (ref 150–400)
RBC: 4.5 MIL/uL (ref 4.22–5.81)
RDW: 12.9 % (ref 11.5–15.5)
WBC: 7.7 10*3/uL (ref 4.0–10.5)
nRBC: 0 % (ref 0.0–0.2)

## 2018-07-03 LAB — COMPREHENSIVE METABOLIC PANEL
ALT: 24 U/L (ref 0–44)
AST: 34 U/L (ref 15–41)
Albumin: 4 g/dL (ref 3.5–5.0)
Alkaline Phosphatase: 57 U/L (ref 38–126)
Anion gap: 13 (ref 5–15)
BUN: 14 mg/dL (ref 8–23)
CALCIUM: 9.1 mg/dL (ref 8.9–10.3)
CHLORIDE: 102 mmol/L (ref 98–111)
CO2: 21 mmol/L — ABNORMAL LOW (ref 22–32)
Creatinine, Ser: 1.52 mg/dL — ABNORMAL HIGH (ref 0.61–1.24)
GFR calc non Af Amer: 46 mL/min — ABNORMAL LOW (ref >60)
GFR, EST AFRICAN AMERICAN: 54 mL/min — AB (ref >60)
GLUCOSE: 159 mg/dL — AB (ref 70–99)
Potassium: 4.2 mmol/L (ref 3.5–5.1)
SODIUM: 136 mmol/L (ref 135–145)
Total Bilirubin: 0.7 mg/dL (ref 0.3–1.2)
Total Protein: 6.9 g/dL (ref 6.5–8.1)

## 2018-07-03 LAB — I-STAT TROPONIN, ED: Troponin i, poc: 0 ng/mL (ref 0.00–0.08)

## 2018-07-03 LAB — TROPONIN I

## 2018-07-03 LAB — LIPASE, BLOOD: LIPASE: 27 U/L (ref 11–51)

## 2018-07-03 LAB — BRAIN NATRIURETIC PEPTIDE: B Natriuretic Peptide: 31.3 pg/mL (ref 0.0–100.0)

## 2018-07-03 MED ORDER — SODIUM CHLORIDE 0.9 % IV SOLN
INTRAVENOUS | Status: DC
  Administered 2018-07-03: 21:00:00 via INTRAVENOUS

## 2018-07-03 MED ORDER — LORATADINE 10 MG PO TABS
10.0000 mg | ORAL_TABLET | Freq: Every day | ORAL | Status: DC
  Administered 2018-07-04 – 2018-07-05 (×2): 10 mg via ORAL
  Filled 2018-07-03 (×2): qty 1

## 2018-07-03 MED ORDER — ASPIRIN EC 81 MG PO TBEC: 81.0000 mg | DELAYED_RELEASE_TABLET | Freq: Every day | ORAL | Status: DC

## 2018-07-03 MED ORDER — NITROGLYCERIN 0.4 MG SL SUBL
0.4000 mg | SUBLINGUAL_TABLET | Freq: Once | SUBLINGUAL | Status: AC
  Administered 2018-07-03: 0.4 mg via SUBLINGUAL
  Filled 2018-07-03: qty 1

## 2018-07-03 MED ORDER — NITROGLYCERIN 0.4 MG SL SUBL
0.4000 mg | SUBLINGUAL_TABLET | Freq: Once | SUBLINGUAL | Status: AC
  Administered 2018-07-03: 0.4 mg via SUBLINGUAL

## 2018-07-03 MED ORDER — IOPAMIDOL (ISOVUE-370) INJECTION 76%
INTRAVENOUS | Status: AC
  Filled 2018-07-03: qty 100

## 2018-07-03 MED ORDER — FLUOXETINE HCL 20 MG PO CAPS
60.0000 mg | ORAL_CAPSULE | Freq: Every day | ORAL | Status: DC
  Administered 2018-07-04 – 2018-07-05 (×2): 60 mg via ORAL
  Filled 2018-07-03 (×3): qty 3

## 2018-07-03 MED ORDER — IOPAMIDOL (ISOVUE-370) INJECTION 76%
100.0000 mL | Freq: Once | INTRAVENOUS | Status: AC | PRN
  Administered 2018-07-03: 100 mL via INTRAVENOUS

## 2018-07-03 MED ORDER — NITROGLYCERIN 0.4 MG SL SUBL
0.4000 mg | SUBLINGUAL_TABLET | SUBLINGUAL | Status: DC | PRN
  Administered 2018-07-03 – 2018-07-04 (×4): 0.4 mg via SUBLINGUAL
  Filled 2018-07-03 (×3): qty 1

## 2018-07-03 MED ORDER — FLUTICASONE PROPIONATE 50 MCG/ACT NA SUSP
1.0000 | Freq: Every day | NASAL | Status: DC
  Administered 2018-07-04: 1 via NASAL
  Filled 2018-07-03: qty 16

## 2018-07-03 MED ORDER — HEPARIN SODIUM (PORCINE) 5000 UNIT/ML IJ SOLN
5000.0000 [IU] | Freq: Three times a day (TID) | INTRAMUSCULAR | Status: DC
  Administered 2018-07-03 – 2018-07-04 (×2): 5000 [IU] via SUBCUTANEOUS
  Filled 2018-07-03 (×2): qty 1

## 2018-07-03 NOTE — ED Provider Notes (Signed)
I have personally seen and examined the patient. I have reviewed the documentation on PMH/FH/Soc Hx. I have discussed the plan of care with the resident and patient.  I have reviewed and agree with the resident's documentation. Please see associated encounter note.  Briefly, the patient is a 68 y.o. male here with history of hypertension, high cholesterol who presents to the ED with chest pain.  Patient with unremarkable vitals.  No fever.  Patient with chest pain and discomfort with diaphoresis, nausea, radiation to the left side of his jaw and shoulder.  This occurred just after vacuuming.  Patient had improvement of symptoms following nitro and aspirin and Zofran.  Patient denies any abdominal pain, shortness of breath.  Has significant family history of blood clots but no cardiac disease that he is aware of.  Patient with good pulses throughout.  Clear breath sounds.  No obvious distress.  EKG shows sinus rhythm.  No ischemic changes.  Troponin within normal limits.  Will get CT PE study, labs.  Likely will need admission for further ACS rule out.  Does not have any infectious symptoms.  CT scan showed no obvious pneumonia, blood clot.  Patient admitted for further ACS rule out to medicine.  Hemodynamically stable throughout my care.  This chart was dictated using voice recognition software.  Despite best efforts to proofread,  errors can occur which can change the documentation meaning.    EKG Interpretation  Date/Time:  Monday July 03 2018 16:38:27 EST Ventricular Rate:  69 PR Interval:    QRS Duration: 102 QT Interval:  417 QTC Calculation: 447 R Axis:   24 Text Interpretation:  Sinus rhythm Prolonged PR interval Abnormal R-wave progression, early transition Inferior infarct, old Confirmed by Virgina Norfolk [phone]) on 07/03/2018 4:59:58 PM        Virgina Norfolk, DO 07/03/18 2304

## 2018-07-03 NOTE — ED Provider Notes (Signed)
[facility] EMERGENCY DEPARTMENT Provider Note   CSN: [id number] Arrival date & time: 07/03/18  1630  History   Chief Complaint Chief Complaint  Patient presents with  . Chest Pain    HPI Jamian Andujo is a 68 y.o. male.  HPI   Ayodeji Keimig is a 68 y.o. male with PMH of depression, seasonal allergies, HLD, HTN, OSA on CPAP, plical hernia status post surgical repair remotely who presents with acute onset chest pressure and burning associated with lightheadedness and nausea.  He reports that he was vacuuming and the symptoms came on suddenly.  His chest pressure and burning radiated to the anterior neck and jaw bilaterally but not down his arms or to his back.  No abdominal pain.  He became significantly weak diffusely but did not lose consciousness.  No palpitations.  Reported significant amount of dyspnea initially which is still present at this time but slightly improved.  He became diaphoretic with the pain.  Received nitroglycerin and full dose aspirin prior to arrival and while he reported that he is not sure that the nitroglycerin helped and he did have some improvement prior to arrival by EMS but his pressure has increased.  He was sent from clinic via EMS.  Past Medical History:  Diagnosis Date  . Depression   . H/O seasonal allergies    tx. OTC meds  . Hyperlipidemia   . Hypertension   . Prostatitis   . Sleep apnea    cpap  . Umbilical hernia    resolved -s/p surgical repair    Patient Active Problem List   Diagnosis Date Noted  . Angina pectoris (HCC) 07/03/2018  . AKI (acute kidney injury) (HCC) 07/03/2018  . Prostatitis 07/03/2018  . HTN (hypertension) 07/03/2018  . Depression 07/03/2018  . Shortness of breath 11/13/2015  . Abnormal EKG 11/13/2015  . OSA (obstructive sleep apnea) 11/13/2015  . Chest pain at rest 11/13/2015  . Umbilical hernia with obstruction 11/25/2011    Past Surgical History:  Procedure Laterality Date  . APPENDECTOMY     . CATARACT EXTRACTION Left    30 yrs ago  . COLONOSCOPY      multiple- x1 colon polyp removed  . COLONOSCOPY WITH PROPOFOL N/A 10/22/2014   Procedure: COLONOSCOPY WITH PROPOFOL;  Surgeon: Charolett Bumpers, MD;  Location: [facility];  Service: Endoscopy;  Laterality: N/A;  . EYE SURGERY     choriziom removed right eye 12'15  . HERNIA REPAIR     umbilical hernia repair  . TONSILLECTOMY          Home Medications    Prior to Admission medications   Medication Sig Start Date End Date Taking? Authorizing Provider  aspirin 81 MG tablet Take 81 mg by mouth at bedtime.    Yes [provider]  fexofenadine (ALLEGRA) 180 MG tablet Take 180 mg by mouth daily.   Yes [provider]  FLUoxetine (PROZAC) 20 MG capsule Take 60 mg by mouth daily.   Yes [provider]  fluticasone (FLONASE) 50 MCG/ACT nasal spray Place 1 spray into both nostrils daily. 09/04/15  Yes [provider]  losartan (COZAAR) 25 MG tablet Take 25 mg by mouth daily. 09/02/15  Yes [provider]  meloxicam (MOBIC) 15 MG tablet Take 15 mg by mouth daily as needed for pain.  06/24/18  Yes [provider]  metFORMIN (GLUCOPHAGE) 1000 MG tablet Take 1,000 mg by mouth daily with breakfast. 06/10/18  Yes [provider]  naproxen (NAPROSYN) 500 MG tablet Take 500 mg by mouth 2 (two) times daily as needed (for pain).    Yes [provider]  sulfamethoxazole-trimethoprim (BACTRIM DS,SEPTRA DS) 800-160 MG tablet Take 1 tablet by mouth 2 (two) times daily. FOR 30 DAYS 06/30/18  Yes [provider]  triamcinolone cream (KENALOG) 0.1 % Apply 1 application topically daily as needed (for irritation).    Yes [provider]    Family History Family History  Problem Relation Age of Onset  . Heart disease Mother   . Cancer Father        colon    Social History Social History   Tobacco Use  . Smoking status: Former Games developermoker  . Smokeless tobacco:  Former NeurosurgeonUser    Quit date: 09/23/1970  Substance Use Topics  . Alcohol use: No  . Drug use: No     Allergies   Codeine   Review of Systems Review of Systems  Constitutional: Positive for diaphoresis and fatigue. Negative for chills and fever.  HENT: Negative for ear pain and sore throat.   Eyes: Negative for pain and visual disturbance.  Respiratory: Positive for chest tightness and shortness of breath. Negative for cough.   Cardiovascular: Positive for chest pain. Negative for palpitations.  Gastrointestinal: Positive for nausea. Negative for abdominal pain and vomiting.  Genitourinary: Negative for dysuria and hematuria.  Musculoskeletal: Negative for arthralgias and back pain.  Skin: Negative for color change and rash.  Neurological: Positive for weakness and light-headedness. Negative for seizures and syncope.  All other systems reviewed and are negative.    Physical Exam Updated Vital Signs BP 127/86   Pulse (!) 59   Temp (!) 97.4 F (36.3 C) (Oral)   Resp 18   Ht 5' 11.25" (1.81 m)   Wt 128.1 kg   SpO2 97%   BMI 39.10 kg/m   Physical Exam Vitals signs and nursing note reviewed.  Constitutional:      Appearance: He is well-developed. He is obese. He is ill-appearing (chronically ill appearing).  HENT:     Head: Normocephalic and atraumatic.  Eyes:     Conjunctiva/sclera: Conjunctivae normal.  Neck:     Musculoskeletal: Neck supple.     Vascular: No carotid bruit.  Cardiovascular:     Rate and Rhythm: Normal rate and regular rhythm.     Heart sounds: Murmur present. Systolic murmur present with a grade of 2/6.  Pulmonary:     Effort: Pulmonary effort is normal. No respiratory distress.     Breath sounds: Normal breath sounds.  Abdominal:     Palpations: Abdomen is soft.     Tenderness: There is no abdominal tenderness.  Musculoskeletal:     Right lower leg: Edema (trace) present.     Left lower leg: Edema (trace) present.  Skin:    General: Skin is  warm and dry.  Neurological:     Mental Status: He is alert.  Psychiatric:        Behavior: Behavior is cooperative.      ED Treatments / Results  Labs (all labs ordered are listed, but only abnormal results are displayed) Labs Reviewed  COMPREHENSIVE METABOLIC PANEL - Abnormal; Notable for the following components:      Result Value   CO2 21 (*)    Glucose, Bld 159 (*)    Creatinine, Ser 1.52 (*)    GFR calc non Af Amer 46 (*)    GFR calc Af Amer 54 (*)  All other components within normal limits  CBC WITH DIFFERENTIAL/PLATELET  LIPASE, BLOOD  BRAIN NATRIURETIC PEPTIDE  TROPONIN I  HEMOGLOBIN A1C  HIV ANTIBODY (ROUTINE TESTING W REFLEX)  TROPONIN I  TROPONIN I  BASIC METABOLIC PANEL  LIPID PANEL  I-STAT TROPONIN, ED    EKG EKG Interpretation  Date/Time:  Monday July 03 2018 16:38:27 EST Ventricular Rate:  69 PR Interval:    QRS Duration: 102 QT Interval:  417 QTC Calculation: 447 R Axis:   24 Text Interpretation:  Sinus rhythm Prolonged PR interval Abnormal R-wave progression, early transition Inferior infarct, old Confirmed by Virgina Norfolk [phone]) on 07/03/2018 4:59:58 PM   Radiology Ct Angio Chest Pe W And/or Wo Contrast  Result Date: 07/03/2018 CLINICAL DATA:  68 year old male with history of chest discomfort/pressure for the past 2 hours with some pain radiating into the jaw. Dizziness with nausea and vomiting. Prior history of embolism. EXAM: CT ANGIOGRAPHY CHEST WITH CONTRAST TECHNIQUE: Multidetector CT imaging of the chest was performed using the standard protocol during bolus administration of intravenous contrast. Multiplanar CT image reconstructions and MIPs were obtained to evaluate the vascular anatomy. CONTRAST:  ISOVUE-370 IOPAMIDOL (ISOVUE-370) INJECTION 76% COMPARISON:  None. FINDINGS: Cardiovascular: No filling defect in the pulmonary arterial tree to suggest underlying pulmonary embolism. Heart size is normal. There is no significant  pericardial fluid, thickening or pericardial calcification. There is aortic atherosclerosis, as well as atherosclerosis of the great vessels of the mediastinum and the coronary arteries, including calcified atherosclerotic plaque in the left main, left anterior descending and left circumflex coronary arteries. Calcifications of the aortic valve. Mediastinum/Nodes: No pathologically enlarged mediastinal or hilar lymph nodes. Esophagus is unremarkable in appearance. No axillary lymphadenopathy. Lungs/Pleura: Mild mosaic attenuation throughout the lungs, which may suggest the presence of some air trapping. No consolidative airspace disease. No pleural effusions. No suspicious appearing pulmonary nodules or masses are noted. Upper Abdomen: Mild diffuse low attenuation throughout the visualized hepatic parenchyma, indicative of hepatic steatosis. Musculoskeletal: There are no aggressive appearing lytic or blastic lesions noted in the visualized portions of the skeleton. Review of the MIP images confirms the above findings. IMPRESSION: 1. No evidence of pulmonary embolism. 2. Probable air trapping in the lungs. 3. Aortic atherosclerosis, in addition to left main and 2 vessel coronary artery disease. Please note that although the presence of coronary artery calcium documents the presence of coronary artery disease, the severity of this disease and any potential stenosis cannot be assessed on this non-gated CT examination. Assessment for potential risk factor modification, dietary therapy or pharmacologic therapy may be warranted, if clinically indicated. 4. There are calcifications of the aortic valve. Echocardiographic correlation for evaluation of potential valvular dysfunction may be warranted if clinically indicated. 5. Hepatic steatosis. Aortic Atherosclerosis (ICD10-I70.0). Electronically Signed   By: Trudie Reed M.D.   On: 07/03/2018 19:00   US Renal  Result Date: 07/03/2018 CLINICAL DATA:  Acute kidney  injury EXAM: RENAL / URINARY TRACT ULTRASOUND COMPLETE COMPARISON:  CT abdomen pelvis 06/22/2018 FINDINGS: Right Kidney: Renal measurements: 11.0 x 5.9 x 5.1 cm = volume: 170 mL mL . Echogenicity within normal limits. No mass or hydronephrosis visualized. Left Kidney: Renal measurements: 12.4 x 5.1 x 5.6 cm = volume: 182 mL. Echogenicity within normal limits. No mass or hydronephrosis visualized. Lower pole cyst measures 3.2 x 3.4 x 3.7 cm Bladder: Appears normal for degree of bladder distention. IMPRESSION: No hydronephrosis or other finding that would contribute to acute kidney injury. Electronically Signed  By: Deatra RobinsonKevin  Herman M.D.   On: 07/03/2018 22:08   Dg Chest Portable 1 View  Result Date: 07/03/2018 CLINICAL DATA:  Central chest pain and tightness which shortness-of-breath. EXAM: PORTABLE CHEST 1 VIEW COMPARISON:  10/22/2015 FINDINGS: Lungs are somewhat hypoinflated without focal airspace consolidation or effusion. Cardiomediastinal silhouette and remainder the exam is unchanged. IMPRESSION: No active disease. Electronically Signed   By: Elberta Fortisaniel  Boyle M.D.   On: 07/03/2018 17:29    Procedures Procedures (including critical care time)  Medications Ordered in ED Medications  iopamidol (ISOVUE-370) 76 % injection (has no administration in time range)  nitroGLYCERIN (NITROSTAT) SL tablet 0.4 mg (0.4 mg Sublingual Given 07/03/18 2313)  aspirin EC tablet 81 mg (81 mg Oral Not Given 07/03/18 2105)  FLUoxetine (PROZAC) capsule 60 mg (has no administration in time range)  loratadine (CLARITIN) tablet 10 mg (has no administration in time range)  fluticasone (FLONASE) 50 MCG/ACT nasal spray 1 spray (has no administration in time range)  heparin injection 5,000 Units (5,000 Units Subcutaneous Given 07/03/18 2303)  0.9 %  sodium chloride infusion ( Intravenous New Bag/Given 07/03/18 2117)  nitroGLYCERIN (NITROSTAT) SL tablet 0.4 mg (0.4 mg Sublingual Given 07/03/18 1755)  nitroGLYCERIN (NITROSTAT) SL  tablet 0.4 mg (0.4 mg Sublingual Given 07/03/18 1821)  iopamidol (ISOVUE-370) 76 % injection 100 mL (100 mLs Intravenous Contrast Given 07/03/18 1833)     Initial Impression / Assessment and Plan / ED Course  I have reviewed the triage vital signs and the nursing notes.  Pertinent labs & imaging results that were available during my care of the patient were reviewed by me and considered in my medical decision making (see chart for details).      MDM:  Imaging: Chest x-ray shows no acute pathology.  PE study negative for PE.  Probable air trapping bilaterally.  Aortic valve calcifications.  ED Provider Interpretation of EKG: Sinus rhythm with rate of 69 bpm, normal axis, no ST segment elevation or depression, pathologic T wave changes or interval irregularities.  No delta wave.  Q waves present in leads III and aVF.  S1Q3T3 present.  Q waves and the form are unchanged from prior EKG in May 2017.  Labs: Lipase normal, CMP with creatinine of 1.5 (baseline around 1.0-1.2), CBC normal, troponin undetectable.  BNP 31  On initial evaluation, patient appears ill. Afebrile and hemodynamically stable. Alert and oriented x4, pleasant, and cooperative.  Presents with chest pressure, burning, and dyspnea with weakness as detailed above.  Echo on 12/03/2015 showed LVEF of 55 to 60% with grade 1 diastolic dysfunction.  No aortic valve stenosis, trivial mitral valve regurg, no tricuspid regurgitation.  Exercise tolerance test at that time showed patient achieved 79% of his predicted heart rate with a negative but not diagnostic exam.  Stopped early because of shortness of breath.  On exam, there is no focal abnormality. No history of shingles, no rash, and low suspicion for Zoster at this time. No tenderness to palpation and no recent falls or trauma. EKG shows no evidence for acute ischemia or arrhythmia. Given ASA PTA and nitro PTA x questionable improvement. Given additional Nitro here with further improvement.  Troponin undetectable. HEAR 6. ACS remains on differential and requires additional observation and treatment.  Based on EKG and hx I have low suspicion for pericarditis. No evidence for myocarditis. CXR without evidence for pneumonia, pneumothorax, or acute bony abnormality. No fever or leukocytosis and low suspicion for acute infectious etiology. No back pain, pulse discrepancy, or neuro  deficit. Doubt aortic dissection.   No history of CHF. BNP normal. No orthopnea, PND, and no LE edema. Low suspicion for CHF exacerbation.  Strong family history of PE and PE study ordered as above which is negative.  Although he does have aortic calcification.  Murmur that could be consistent with aortic stenosis.  This can be evaluated more as an inpatient  Patient agreed with plan to admit and was admitted to the hospital service in stable condition for further evaluation and management.  The plan for this patient was discussed with Dr. Lockie Mola who voiced agreement and who oversaw evaluation and treatment of this patient.   The patient was fully informed and involved with the history taking, evaluation, workup including labs/images, and plan. The patient's concerns and questions were addressed to the patient's satisfaction and he expressed agreement with the plan to admit.    Final Clinical Impressions(s) / ED Diagnoses   Final diagnoses:  AKI (acute kidney injury) [facility])    ED Discharge Orders    None       Ahnaf Caponi, Sherryle Lis, MD 07/04/18 0057    Virgina Norfolk, DO 07/04/18 0111

## 2018-07-03 NOTE — ED Notes (Signed)
Patient transported to CT 

## 2018-07-03 NOTE — Plan of Care (Signed)
  Problem: Education: Goal: Knowledge of General Education information will improve Description Including pain rating scale, medication(s)/side effects and non-pharmacologic comfort measures Outcome: Progressing   Problem: Clinical Measurements: Goal: Ability to maintain clinical measurements within normal limits will improve Outcome: Progressing Goal: Will remain free from infection Outcome: Progressing Goal: Diagnostic test results will improve Outcome: Progressing Goal: Cardiovascular complication will be avoided Outcome: Progressing   Problem: Activity: Goal: Risk for activity intolerance will decrease Outcome: Progressing   Problem: Clinical Measurements: Goal: Respiratory complications will improve Outcome: Completed/Met

## 2018-07-03 NOTE — H&P (Signed)
History and Physical    Scott Young BWI:[id number] DOB: Dec 02, 1950 DOA: 07/03/2018  PCP: Renford Dills, MD Patient coming from: Home  Chief Complaint: Chest pain   HPI: Scott Young is a 68 y.o. male with medical history significant of hypertension, hyperlipidemia, recently diagnosed prostatitis on Bactrim presenting to the hospital via EMS for evaluation of chest pain.  Patient states at around 3 PM this afternoon he was vacuuming and experience weakness all over, shortness of breath, diaphoresis, nausea, and substernal chest pressure.  He also had associated tingling in both of his arms and hands.  He then went to [facility] urgent care and was sent here from there.  Chest pain resolved after receiving nitroglycerin by EMS and in the ED.  States he smoked cigarettes from the age of [age] but not since then.  States his maternal grandfather had a blood clot and MI and died at age 61.  His maternal uncle had a blood clot and died at age 52.  His mother had a blood clot and died at age 75.  Patient states his oral intake is good.  States he was recently diagnosed with prostatitis and started on Bactrim 4 days ago for a 30-day course by urology.  Reports having nocturia for the past 4 weeks.  Review of Systems: As per HPI otherwise 10 point review of systems negative.  Past Medical History:  Diagnosis Date  . Depression   . H/O seasonal allergies    tx. OTC meds  . Hyperlipidemia   . Hypertension   . Prostatitis   . Sleep apnea    cpap  . Umbilical hernia    resolved -s/p surgical repair    Past Surgical History:  Procedure Laterality Date  . APPENDECTOMY    . CATARACT EXTRACTION Left    30 yrs ago  . COLONOSCOPY      multiple- x1 colon polyp removed  . COLONOSCOPY WITH PROPOFOL N/A 10/22/2014   Procedure: COLONOSCOPY WITH PROPOFOL;  Surgeon: Charolett Bumpers, MD;  Location: [facility];  Service: Endoscopy;  Laterality: N/A;  . EYE SURGERY     choriziom removed right eye 12'15  .  HERNIA REPAIR     umbilical hernia repair  . TONSILLECTOMY       reports that he has quit smoking. He quit smokeless tobacco use about 47 years ago. He reports that he does not drink alcohol or use drugs.  Allergies  Allergen Reactions  . Codeine Nausea And Vomiting    Family History  Problem Relation Age of Onset  . Heart disease Mother   . Cancer Father        colon    Prior to Admission medications   Medication Sig Start Date End Date Taking? Authorizing Provider  aspirin 81 MG tablet Take 81 mg by mouth at bedtime.    Yes [provider]  fexofenadine (ALLEGRA) 180 MG tablet Take 180 mg by mouth daily.   Yes [provider]  FLUoxetine (PROZAC) 20 MG capsule Take 60 mg by mouth daily.   Yes [provider]  fluticasone (FLONASE) 50 MCG/ACT nasal spray Place 1 spray into both nostrils daily. 09/04/15  Yes [provider]  losartan (COZAAR) 25 MG tablet Take 25 mg by mouth daily. 09/02/15  Yes [provider]  meloxicam (MOBIC) 15 MG tablet Take 15 mg by mouth daily as needed for pain.  06/24/18  Yes [provider]  metFORMIN (GLUCOPHAGE) 1000 MG tablet Take 1,000 mg by mouth daily  with breakfast. 06/10/18  Yes [provider]  naproxen (NAPROSYN) 500 MG tablet Take 500 mg by mouth 2 (two) times daily as needed (for pain).    Yes [provider]  sulfamethoxazole-trimethoprim (BACTRIM DS,SEPTRA DS) 800-160 MG tablet Take 1 tablet by mouth 2 (two) times daily. FOR 30 DAYS 06/30/18  Yes [provider]  triamcinolone cream (KENALOG) 0.1 % Apply 1 application topically daily as needed (for irritation).    Yes [provider]    Physical Exam: Vitals:   07/03/18 1730 07/03/18 1800 07/03/18 1830 07/03/18 1900  BP: 135/76 114/74 121/71 128/84  Pulse: 71 77 67 66  Resp: 18 14 15 18   Temp:      TempSrc:      SpO2: 94% 94% 96% 97%  Weight:      Height:        Physical Exam  Constitutional:  He is oriented to person, place, and time. He appears well-developed and well-nourished. No distress.  HENT:  Head: Normocephalic.  Mouth/Throat: Oropharynx is clear and moist.  Eyes: Right eye exhibits no discharge. Left eye exhibits no discharge.  Neck: Neck supple.  Cardiovascular: Normal rate, regular rhythm and intact distal pulses.  Pulmonary/Chest: Effort normal and breath sounds normal. No respiratory distress. He has no wheezes. He has no rales.  Abdominal: Soft. Bowel sounds are normal. There is no abdominal tenderness. There is no guarding.  Musculoskeletal:        General: No edema.  Neurological: He is alert and oriented to person, place, and time.  Skin: Skin is warm and dry. He is not diaphoretic.  Psychiatric: He has a normal mood and affect. His behavior is normal.     Labs on Admission: I have personally reviewed following labs and imaging studies  CBC: Recent Labs  Lab 07/03/18 1649  WBC 7.7  NEUTROABS 5.1  HGB 13.5  HCT 41.3  MCV 91.8  PLT 205   Basic Metabolic Panel: Recent Labs  Lab 07/03/18 1649  NA 136  K 4.2  CL 102  CO2 21*  GLUCOSE 159*  BUN 14  CREATININE 1.52*  CALCIUM 9.1   GFR: Estimated Creatinine Clearance: 63.6 mL/min (A) (by C-G formula based on SCr of 1.52 mg/dL (H)). Liver Function Tests: Recent Labs  Lab 07/03/18 1649  AST 34  ALT 24  ALKPHOS 57  BILITOT 0.7  PROT 6.9  ALBUMIN 4.0   Recent Labs  Lab 07/03/18 1649  LIPASE 27   No results for input(s): AMMONIA in the last 168 hours. Coagulation Profile: No results for input(s): INR, PROTIME in the last 168 hours. Cardiac Enzymes: No results for input(s): CKTOTAL, CKMB, CKMBINDEX, TROPONINI in the last 168 hours. BNP (last 3 results) No results for input(s): PROBNP in the last 8760 hours. HbA1C: No results for input(s): HGBA1C in the last 72 hours. CBG: No results for input(s): GLUCAP in the last 168 hours. Lipid Profile: No results for input(s): CHOL, HDL,  LDLCALC, TRIG, CHOLHDL, LDLDIRECT in the last 72 hours. Thyroid Function Tests: No results for input(s): TSH, T4TOTAL, FREET4, T3FREE, THYROIDAB in the last 72 hours. Anemia Panel: No results for input(s): VITAMINB12, FOLATE, FERRITIN, TIBC, IRON, RETICCTPCT in the last 72 hours. Urine analysis:    Component Value Date/Time   COLORURINE YELLOW 12/06/2011 1133   APPEARANCEUR CLEAR 12/06/2011 1133   LABSPEC 1.010 12/06/2011 1133   PHURINE 6.5 12/06/2011 1133   GLUCOSEU NEG 12/06/2011 1133   HGBUR NEG 12/06/2011 1133   BILIRUBINUR NEG  12/06/2011 1133   KETONESUR NEG 12/06/2011 1133   PROTEINUR NEG 12/06/2011 1133   UROBILINOGEN 0.2 12/06/2011 1133   NITRITE NEG 12/06/2011 1133   LEUKOCYTESUR NEG 12/06/2011 1133    Radiological Exams on Admission: Ct Angio Chest Pe W And/or Wo Contrast  Result Date: 07/03/2018 CLINICAL DATA:  68 year old male with history of chest discomfort/pressure for the past 2 hours with some pain radiating into the jaw. Dizziness with nausea and vomiting. Prior history of embolism. EXAM: CT ANGIOGRAPHY CHEST WITH CONTRAST TECHNIQUE: Multidetector CT imaging of the chest was performed using the standard protocol during bolus administration of intravenous contrast. Multiplanar CT image reconstructions and MIPs were obtained to evaluate the vascular anatomy. CONTRAST:  ISOVUE-370 IOPAMIDOL (ISOVUE-370) INJECTION 76% COMPARISON:  None. FINDINGS: Cardiovascular: No filling defect in the pulmonary arterial tree to suggest underlying pulmonary embolism. Heart size is normal. There is no significant pericardial fluid, thickening or pericardial calcification. There is aortic atherosclerosis, as well as atherosclerosis of the great vessels of the mediastinum and the coronary arteries, including calcified atherosclerotic plaque in the left main, left anterior descending and left circumflex coronary arteries. Calcifications of the aortic valve. Mediastinum/Nodes: No  pathologically enlarged mediastinal or hilar lymph nodes. Esophagus is unremarkable in appearance. No axillary lymphadenopathy. Lungs/Pleura: Mild mosaic attenuation throughout the lungs, which may suggest the presence of some air trapping. No consolidative airspace disease. No pleural effusions. No suspicious appearing pulmonary nodules or masses are noted. Upper Abdomen: Mild diffuse low attenuation throughout the visualized hepatic parenchyma, indicative of hepatic steatosis. Musculoskeletal: There are no aggressive appearing lytic or blastic lesions noted in the visualized portions of the skeleton. Review of the MIP images confirms the above findings. IMPRESSION: 1. No evidence of pulmonary embolism. 2. Probable air trapping in the lungs. 3. Aortic atherosclerosis, in addition to left main and 2 vessel coronary artery disease. Please note that although the presence of coronary artery calcium documents the presence of coronary artery disease, the severity of this disease and any potential stenosis cannot be assessed on this non-gated CT examination. Assessment for potential risk factor modification, dietary therapy or pharmacologic therapy may be warranted, if clinically indicated. 4. There are calcifications of the aortic valve. Echocardiographic correlation for evaluation of potential valvular dysfunction may be warranted if clinically indicated. 5. Hepatic steatosis. Aortic Atherosclerosis (ICD10-I70.0). Electronically Signed   By: Trudie Reed M.D.   On: 07/03/2018 19:00   Dg Chest Portable 1 View  Result Date: 07/03/2018 CLINICAL DATA:  Central chest pain and tightness which shortness-of-breath. EXAM: PORTABLE CHEST 1 VIEW COMPARISON:  10/22/2015 FINDINGS: Lungs are somewhat hypoinflated without focal airspace consolidation or effusion. Cardiomediastinal silhouette and remainder the exam is unchanged. IMPRESSION: No active disease. Electronically Signed   By: Elberta Fortis M.D.   On: 07/03/2018 17:29     EKG: Independently reviewed.  Sinus rhythm, nonspecific T wave abnormalities.  No significant change since prior tracing.  Assessment/Plan Active Problems:   Angina pectoris (HCC)   AKI (acute kidney injury) (HCC)   Prostatitis   HTN (hypertension)   Depression   Angina -Chest pain relieved after receiving 1 dose of nitroglycerin by EMS and 2 doses in the ED.  Patient currently appears comfortable on exam.  Hemodynamically stable. -Risk factors for CAD include hypertension, hyperlipidemia, age, gender, and obesity. -Exercise stress test done in June 2017 was normal. -I-STAT troponin negative and EKG not suggestive of ACS. -CTA done in the ED negative for PE.  Does show evidence of coronary atherosclerosis,  including left main, LAD, and left circumflex coronary arteries. -Cardiac monitoring -Received full dose aspirin by EMS.  -Sublingual nitroglycerin as needed -Trend troponin -Echocardiogram -Check lipid panel, A1c -Consult cardiology in the morning.  AKI BUN 14, creatinine 1.5.  No recent baseline, creatinine was 1.0 two years ago.  Patient was started on Bactrim 4 days ago for prostatitis.  AKI possibly related to Bactrim use.  -Hold Bactrim -Hold home ARB, NSAIDs -IV fluid hydration -Renal ultrasound -Continue to monitor renal function  Prostatitis Recently diagnosed and patient was started on Bactrim by urology 4 days ago.  Afebrile and no leukocytosis.  Nontoxic appearing. -Will hold Bactrim at this time in the setting of worsening renal function. Consider starting the patient on another antibiotic tomorrow.   Hypertension Currently normotensive.  Hold home ARB at this time.  Depression -Continue Prozac  DVT prophylaxis: Subcutaneous heparin Code Status: Patient wishes to be full code. Family Communication: Wife at bedside. Disposition Plan: Anticipate discharge in 1 to 2 days. Consults called: None Admission status: Observation, cardiac  telemetry   John GiovanniVasundhra Breland Elders MD [facility] Pager [phone]  If 7PM-7AM, please contact night-coverage [email or web address] Password [email or web address]  07/03/2018, 8:24 PM

## 2018-07-03 NOTE — ED Triage Notes (Signed)
Per [facility] patient coming from doctors office for chest discomfort/pressure for about 2 hours. Pain radiating into jaw. Also having dizziness and nausea and vomiting. Patient given .4 nitro, 324 aspirin and 8 mg zofran PTA.

## 2018-07-03 NOTE — Progress Notes (Signed)
Pt experienced an episode of chest discomfort after returning from ultrasound.  Pain relieved by 2 SL nitro. Pt resting comfortably. Will continue to monitor patient condition closely.

## 2018-07-04 ENCOUNTER — Encounter (HOSPITAL_COMMUNITY)
Admission: EM | Disposition: A | Discharge status: Home / Self Care | Payer: Self-pay | Source: Home / Self Care | Attending: Internal Medicine

## 2018-07-04 ENCOUNTER — Encounter (HOSPITAL_COMMUNITY): Payer: Self-pay | Admitting: Interventional Cardiology

## 2018-07-04 ENCOUNTER — Observation Stay (HOSPITAL_BASED_OUTPATIENT_CLINIC_OR_DEPARTMENT_OTHER): Payer: Medicare Other

## 2018-07-04 DIAGNOSIS — Z7951 Long term (current) use of inhaled steroids: Secondary | ICD-10-CM | POA: Diagnosis not present

## 2018-07-04 DIAGNOSIS — Z7982 Long term (current) use of aspirin: Secondary | ICD-10-CM | POA: Diagnosis not present

## 2018-07-04 DIAGNOSIS — I25119 Atherosclerotic heart disease of native coronary artery with unspecified angina pectoris: Principal | ICD-10-CM

## 2018-07-04 DIAGNOSIS — I209 Angina pectoris, unspecified: Secondary | ICD-10-CM

## 2018-07-04 DIAGNOSIS — Z87891 Personal history of nicotine dependence: Secondary | ICD-10-CM | POA: Diagnosis not present

## 2018-07-04 DIAGNOSIS — F329 Major depressive disorder, single episode, unspecified: Secondary | ICD-10-CM

## 2018-07-04 DIAGNOSIS — Z79899 Other long term (current) drug therapy: Secondary | ICD-10-CM | POA: Diagnosis not present

## 2018-07-04 DIAGNOSIS — G4733 Obstructive sleep apnea (adult) (pediatric): Secondary | ICD-10-CM | POA: Diagnosis present

## 2018-07-04 DIAGNOSIS — R079 Chest pain, unspecified: Secondary | ICD-10-CM | POA: Diagnosis present

## 2018-07-04 DIAGNOSIS — N179 Acute kidney failure, unspecified: Secondary | ICD-10-CM | POA: Diagnosis not present

## 2018-07-04 DIAGNOSIS — E785 Hyperlipidemia, unspecified: Secondary | ICD-10-CM | POA: Diagnosis not present

## 2018-07-04 DIAGNOSIS — R072 Precordial pain: Secondary | ICD-10-CM | POA: Diagnosis not present

## 2018-07-04 DIAGNOSIS — N410 Acute prostatitis: Secondary | ICD-10-CM | POA: Diagnosis not present

## 2018-07-04 DIAGNOSIS — E7800 Pure hypercholesterolemia, unspecified: Secondary | ICD-10-CM | POA: Diagnosis present

## 2018-07-04 DIAGNOSIS — I10 Essential (primary) hypertension: Secondary | ICD-10-CM

## 2018-07-04 DIAGNOSIS — Z8249 Family history of ischemic heart disease and other diseases of the circulatory system: Secondary | ICD-10-CM | POA: Diagnosis not present

## 2018-07-04 DIAGNOSIS — Z7952 Long term (current) use of systemic steroids: Secondary | ICD-10-CM | POA: Diagnosis not present

## 2018-07-04 DIAGNOSIS — Z7984 Long term (current) use of oral hypoglycemic drugs: Secondary | ICD-10-CM | POA: Diagnosis not present

## 2018-07-04 DIAGNOSIS — Z6838 Body mass index (BMI) 38.0-38.9, adult: Secondary | ICD-10-CM | POA: Diagnosis not present

## 2018-07-04 DIAGNOSIS — E669 Obesity, unspecified: Secondary | ICD-10-CM | POA: Diagnosis present

## 2018-07-04 HISTORY — PX: LEFT HEART CATH AND CORONARY ANGIOGRAPHY: CATH118249

## 2018-07-04 LAB — BASIC METABOLIC PANEL
Anion gap: 11 (ref 5–15)
BUN: 12 mg/dL (ref 8–23)
CO2: 21 mmol/L — ABNORMAL LOW (ref 22–32)
Calcium: 8.7 mg/dL — ABNORMAL LOW (ref 8.9–10.3)
Chloride: 106 mmol/L (ref 98–111)
Creatinine, Ser: 1.43 mg/dL — ABNORMAL HIGH (ref 0.61–1.24)
GFR calc Af Amer: 58 mL/min — ABNORMAL LOW (ref >60)
GFR calc non Af Amer: 50 mL/min — ABNORMAL LOW (ref >60)
Glucose, Bld: 108 mg/dL — ABNORMAL HIGH (ref 70–99)
Potassium: 4.4 mmol/L (ref 3.5–5.1)
Sodium: 138 mmol/L (ref 135–145)

## 2018-07-04 LAB — CBC
HCT: 38.6 % — ABNORMAL LOW (ref 39.0–52.0)
Hemoglobin: 13.2 g/dL (ref 13.0–17.0)
MCH: 31.4 pg (ref 26.0–34.0)
MCHC: 34.2 g/dL (ref 30.0–36.0)
MCV: 91.7 fL (ref 80.0–100.0)
NRBC: 0 % (ref 0.0–0.2)
Platelets: 178 10*3/uL (ref 150–400)
RBC: 4.21 MIL/uL — ABNORMAL LOW (ref 4.22–5.81)
RDW: 13.2 % (ref 11.5–15.5)
WBC: 5.7 10*3/uL (ref 4.0–10.5)

## 2018-07-04 LAB — HEMOGLOBIN A1C
Hgb A1c MFr Bld: 6.7 % — ABNORMAL HIGH (ref 4.8–5.6)
MEAN PLASMA GLUCOSE: 145.59 mg/dL

## 2018-07-04 LAB — LIPID PANEL
Cholesterol: 187 mg/dL (ref 0–200)
HDL: 47 mg/dL (ref >40)
LDL Cholesterol: 121 mg/dL — ABNORMAL HIGH (ref 0–99)
Total CHOL/HDL Ratio: 4 RATIO
Triglycerides: 97 mg/dL (ref <150)
VLDL: 19 mg/dL (ref 0–40)

## 2018-07-04 LAB — TROPONIN I
Troponin I: <0.03 ng/mL (ref <0.03)
Troponin I: <0.03 ng/mL (ref <0.03)

## 2018-07-04 LAB — CREATININE, SERUM
Creatinine, Ser: 1.29 mg/dL — ABNORMAL HIGH (ref 0.61–1.24)
GFR calc non Af Amer: 57 mL/min — ABNORMAL LOW (ref >60)

## 2018-07-04 LAB — HIV ANTIBODY (ROUTINE TESTING W REFLEX): HIV Screen 4th Generation wRfx: NONREACTIVE

## 2018-07-04 LAB — ECHOCARDIOGRAM COMPLETE
Height: 71.25 in
Weight: 4488 oz

## 2018-07-04 SURGERY — LEFT HEART CATH AND CORONARY ANGIOGRAPHY
Anesthesia: LOCAL

## 2018-07-04 MED ORDER — SODIUM CHLORIDE 0.9% FLUSH: 3.0000 mL | INTRAVENOUS | Status: DC | PRN

## 2018-07-04 MED ORDER — SODIUM CHLORIDE 0.9 % IV SOLN: 250.0000 mL | INTRAVENOUS | Status: DC | PRN

## 2018-07-04 MED ORDER — ASPIRIN 81 MG PO CHEW
81.0000 mg | CHEWABLE_TABLET | ORAL | Status: AC
  Administered 2018-07-04: 81 mg via ORAL
  Filled 2018-07-04: qty 1

## 2018-07-04 MED ORDER — ATORVASTATIN CALCIUM 40 MG PO TABS
40.0000 mg | ORAL_TABLET | Freq: Every day | ORAL | Status: DC
  Administered 2018-07-04: 40 mg via ORAL
  Filled 2018-07-04: qty 1

## 2018-07-04 MED ORDER — FENTANYL CITRATE (PF) 100 MCG/2ML IJ SOLN
INTRAMUSCULAR | Status: DC | PRN
  Administered 2018-07-04: 50 ug via INTRAVENOUS
  Administered 2018-07-04: 25 ug via INTRAVENOUS

## 2018-07-04 MED ORDER — VERAPAMIL HCL 2.5 MG/ML IV SOLN
INTRAVENOUS | Status: AC
  Filled 2018-07-04: qty 2

## 2018-07-04 MED ORDER — ONDANSETRON HCL 4 MG/2ML IJ SOLN: 4.0000 mg | Freq: Four times a day (QID) | INTRAMUSCULAR | Status: DC | PRN

## 2018-07-04 MED ORDER — SODIUM CHLORIDE 0.9% FLUSH: 3.0000 mL | Freq: Two times a day (BID) | INTRAVENOUS | Status: DC

## 2018-07-04 MED ORDER — LIDOCAINE HCL (PF) 1 % IJ SOLN
INTRAMUSCULAR | Status: DC | PRN
  Administered 2018-07-04: 2 mL

## 2018-07-04 MED ORDER — CLOPIDOGREL BISULFATE 75 MG PO TABS
75.0000 mg | ORAL_TABLET | Freq: Every day | ORAL | Status: DC
  Administered 2018-07-05: 75 mg via ORAL
  Filled 2018-07-04: qty 1

## 2018-07-04 MED ORDER — SODIUM CHLORIDE 0.9 % IV SOLN: INTRAVENOUS | Status: DC

## 2018-07-04 MED ORDER — MIDAZOLAM HCL 2 MG/2ML IJ SOLN
INTRAMUSCULAR | Status: DC | PRN
  Administered 2018-07-04: 0.5 mg via INTRAVENOUS
  Administered 2018-07-04: 1 mg via INTRAVENOUS

## 2018-07-04 MED ORDER — FENTANYL CITRATE (PF) 100 MCG/2ML IJ SOLN
INTRAMUSCULAR | Status: AC
  Filled 2018-07-04: qty 2

## 2018-07-04 MED ORDER — HEPARIN (PORCINE) IN NACL 1000-0.9 UT/500ML-% IV SOLN
INTRAVENOUS | Status: AC
  Filled 2018-07-04: qty 1000

## 2018-07-04 MED ORDER — HEPARIN (PORCINE) IN NACL 1000-0.9 UT/500ML-% IV SOLN
INTRAVENOUS | Status: DC | PRN
  Administered 2018-07-04 (×2): 500 mL

## 2018-07-04 MED ORDER — VERAPAMIL HCL 2.5 MG/ML IV SOLN
INTRAVENOUS | Status: DC | PRN
  Administered 2018-07-04: 10 mL via INTRA_ARTERIAL

## 2018-07-04 MED ORDER — LIDOCAINE HCL (PF) 1 % IJ SOLN
INTRAMUSCULAR | Status: AC
  Filled 2018-07-04: qty 30

## 2018-07-04 MED ORDER — ASPIRIN 81 MG PO CHEW: 81.0000 mg | CHEWABLE_TABLET | Freq: Every day | ORAL | Status: DC

## 2018-07-04 MED ORDER — ASPIRIN 81 MG PO CHEW: 81.0000 mg | CHEWABLE_TABLET | ORAL | Status: DC

## 2018-07-04 MED ORDER — SODIUM CHLORIDE 0.9 % WEIGHT BASED INFUSION: 0.4000 mL/kg/h | INTRAVENOUS | Status: DC

## 2018-07-04 MED ORDER — LEVOFLOXACIN 500 MG PO TABS
500.0000 mg | ORAL_TABLET | Freq: Every day | ORAL | Status: DC
  Administered 2018-07-04 – 2018-07-05 (×2): 500 mg via ORAL
  Filled 2018-07-04 (×2): qty 1

## 2018-07-04 MED ORDER — HEPARIN SODIUM (PORCINE) 1000 UNIT/ML IJ SOLN
INTRAMUSCULAR | Status: DC | PRN
  Administered 2018-07-04: 6000 [IU] via INTRAVENOUS

## 2018-07-04 MED ORDER — HEPARIN SODIUM (PORCINE) 1000 UNIT/ML IJ SOLN
INTRAMUSCULAR | Status: AC
  Filled 2018-07-04: qty 1

## 2018-07-04 MED ORDER — HEPARIN SODIUM (PORCINE) 5000 UNIT/ML IJ SOLN
5000.0000 [IU] | Freq: Three times a day (TID) | INTRAMUSCULAR | Status: DC
  Administered 2018-07-04 – 2018-07-05 (×2): 5000 [IU] via SUBCUTANEOUS
  Filled 2018-07-04 (×2): qty 1

## 2018-07-04 MED ORDER — IOHEXOL 350 MG/ML SOLN
INTRAVENOUS | Status: DC | PRN
  Administered 2018-07-04: 55 mL via INTRA_ARTERIAL

## 2018-07-04 MED ORDER — SODIUM CHLORIDE 0.9 % IV SOLN
INTRAVENOUS | Status: DC
  Administered 2018-07-04: 12:00:00 via INTRAVENOUS

## 2018-07-04 MED ORDER — ACETAMINOPHEN 325 MG PO TABS: 650.0000 mg | ORAL_TABLET | ORAL | Status: DC | PRN

## 2018-07-04 MED ORDER — MIDAZOLAM HCL 2 MG/2ML IJ SOLN
INTRAMUSCULAR | Status: AC
  Filled 2018-07-04: qty 2

## 2018-07-04 SURGICAL SUPPLY — 10 items
CATH 5FR JL3.5 JR4 ANG PIG MP (CATHETERS) ×2 IMPLANT
DEVICE RAD COMP TR BAND LRG (VASCULAR PRODUCTS) ×2 IMPLANT
GLIDESHEATH SLEND A-KIT 6F 22G (SHEATH) ×2 IMPLANT
GUIDEWIRE INQWIRE 1.5J.035X260 (WIRE) ×1 IMPLANT
INQWIRE 1.5J .035X260CM (WIRE) ×2
KIT HEART LEFT (KITS) ×2 IMPLANT
PACK CARDIAC CATHETERIZATION (CUSTOM PROCEDURE TRAY) ×2 IMPLANT
SHEATH PROBE COVER 6X72 (BAG) ×2 IMPLANT
TRANSDUCER W/STOPCOCK (MISCELLANEOUS) ×2 IMPLANT
TUBING CIL FLEX 10 FLL-RA (TUBING) ×2 IMPLANT

## 2018-07-04 NOTE — Consult Note (Addendum)
Cardiology Consult    Patient ID: Scott Young MRN: [id number], DOB/AGE: 1951-02-04   Admit date: 07/03/2018 Date of Consult: 07/04/2018  Primary Physician: Renford Dills, MD Primary Cardiologist: Lesleigh Noe, MD Requesting Provider: Madelyn Flavors, MD  Patient Profile    Scott Young is a 68 y.o. male with a history of hypertension, hyperlipidemia, sleep apnea on CPAP, who is being seen today for the evaluation of chest pain at the request of Dr. Katrinka Blazing.  History of Present Illness    Scott Young is a 68 year old male with the above history who has previously been seen by Dr. Katrinka Blazing. Patient had normal coronaries on cardiac catheterization in 2007. He last saw Dr. Katrinka Blazing in 11/2015 for evaluation of chest pressure and dyspnea. Exercise Tolerance Test was ordered at that time and was negative but considered non-diagnostic because patient did not reach age-predicted maximum heart rate. An echocardiogram was also performed which showed LVEF of 55-60% with grade 1 diastolic dysfunction but no regional wall motion abnormalities.  Patient reports generalized fatigue and weakness over the past 4 to 5 weeks.  He denies any recent fevers or illnesses.  Yesterday, he was vacuuming and after about 20 minutes he felt suddenly very fatigue and developed substernal chest tightness/pressure with associated palpitations, nausea, and sweating.  Patient ranks the chest pain as a 4/10 on the pain scale and states to his neck/jaw. Pain persisted so patient went to the Urgent Care and Urgent Care had patient transported to the ED via EMS. Patient reports pain finally improved after 3 doses of Nitroglycerin.    In the ED: EKG showed no acute ischemic changes compared to prior tracings. Troponin negative x3. Chest x-ray showed no acute findings. Chest CTA showed no evidence of pulmonary embolism but did note aortic atherosclerosis in addition to left main and 2 vessel CAD. BNP normal. CBC unremarkable. Na 136, K  4.2, Glucose 159, SCr 1.52. Patient was admitted for further evaluation.  Patient has had 2 recurrent episodes of chest pain since arriving to [place] [facility] both of which resolved with Nitroglycerin. He denies any current pain but continues to feel very fatigued and weak.  Patient denies any history of tobacco use. He does have a family history of "blood clots" on his mothers side. Mother had multiple PEs and died in her 64's. Maternal grandfather had blood clot and died from massive heart attack in his 21's. Maternal uncle died from a blood clot in his heart in his 87's. No known family history of clotting disorders.  Past Medical History   Past Medical History:  Diagnosis Date  . Depression   . H/O seasonal allergies    tx. OTC meds  . Hyperlipidemia   . Hypertension   . Prostatitis   . Sleep apnea    cpap  . Umbilical hernia    resolved -s/p surgical repair    Past Surgical History:  Procedure Laterality Date  . APPENDECTOMY    . CATARACT EXTRACTION Left    30 yrs ago  . COLONOSCOPY      multiple- x1 colon polyp removed  . COLONOSCOPY WITH PROPOFOL N/A 10/22/2014   Procedure: COLONOSCOPY WITH PROPOFOL;  Surgeon: Charolett Bumpers, MD;  Location: [facility];  Service: Endoscopy;  Laterality: N/A;  . EYE SURGERY     choriziom removed right eye 12'15  . HERNIA REPAIR     umbilical hernia repair  . TONSILLECTOMY       Allergies  Allergies  Allergen  Reactions  . Codeine Nausea And Vomiting    Inpatient Medications    . aspirin EC  81 mg Oral QHS  . atorvastatin  40 mg Oral q1800  . FLUoxetine  60 mg Oral Daily  . fluticasone  1 spray Each Nare Daily  . heparin  5,000 Units Subcutaneous Q8H  . loratadine  10 mg Oral Daily    Family History    Family History  Problem Relation Age of Onset  . Heart disease Mother   . Cancer Father        colon   He indicated that his mother is deceased. He indicated that his father is deceased.   Social History    Social  History   Socioeconomic History  . Marital status: Married    Spouse name: Not on file  . Number of children: Not on file  . Years of education: Not on file  . Highest education level: Not on file  Occupational History  . Not on file  Social Needs  . Financial resource strain: Not on file  . Food insecurity:    Worry: Not on file    Inability: Not on file  . Transportation needs:    Medical: Not on file    Non-medical: Not on file  Tobacco Use  . Smoking status: Former Games developermoker  . Smokeless tobacco: Former NeurosurgeonUser    Quit date: 09/23/1970  Substance and Sexual Activity  . Alcohol use: No  . Drug use: No  . Sexual activity: Not on file  Lifestyle  . Physical activity:    Days per week: Not on file    Minutes per session: Not on file  . Stress: Not on file  Relationships  . Social connections:    Talks on phone: Not on file    Gets together: Not on file    Attends religious service: Not on file    Active member of club or organization: Not on file    Attends meetings of clubs or organizations: Not on file    Relationship status: Not on file  . Intimate partner violence:    Fear of current or ex partner: Not on file    Emotionally abused: Not on file    Physically abused: Not on file    Forced sexual activity: Not on file  Other Topics Concern  . Not on file  Social History Narrative  . Not on file     Review of Systems    Review of Systems  Constitutional: Positive for diaphoresis and malaise/fatigue. Negative for chills and fever.  HENT: Negative for congestion and sore throat.   Eyes: Negative for blurred vision and double vision.  Respiratory: Positive for shortness of breath. Negative for cough and hemoptysis.   Cardiovascular: Positive for chest pain and palpitations.  Gastrointestinal: Positive for nausea. Negative for blood in stool and vomiting.  Genitourinary: Negative for hematuria.  Musculoskeletal: Negative for myalgias.  Neurological: Positive for  weakness. Negative for loss of consciousness.  Endo/Heme/Allergies: Positive for environmental allergies. Does not bruise/bleed easily.  Psychiatric/Behavioral: Negative for substance abuse.    Physical Exam    Blood pressure (!) 146/89, pulse 67, temperature 97.6 F (36.4 C), temperature source Oral, resp. rate 18, height 5' 11.25" (1.81 m), weight 127.2 kg, SpO2 96 %.  General: 68 y.o. Obese Caucasian male resting comfortably in no acute distress. Pleasant and cooperative. HEENT: Normal  Neck: Supple. No carotid bruits or JVD appreciated. Lungs: No increased work of breathing. Clear  to auscultation bilaterally. No wheezes, rhonchi, or rales. Heart: RRR. Distinct S1 and S2. II/VI systolic murmur noted. No gallops or rubs appreciated..  Abdomen: Soft, non-distended, and non-tender to palpation. Bowel sounds present. Extremities: No significant lower extremity edema. Radial pulses 2+ and equal bilaterally. Skin: Warm and dry. Neuro: Alert and oriented x3. No focal deficits. Moves all extremities spontaneously. Psych: Normal affect.  Labs    Troponin [facility] of Care Test) Recent Labs    07/03/18 1659  TROPIPOC 0.00   Recent Labs    07/03/18 2008 07/04/18 0156  TROPONINI <0.03 <0.03   Lab Results  Component Value Date   WBC 7.7 07/03/2018   HGB 13.5 07/03/2018   HCT 41.3 07/03/2018   MCV 91.8 07/03/2018   PLT 205 07/03/2018    Recent Labs  Lab 07/03/18 1649  NA 136  K 4.2  CL 102  CO2 21*  BUN 14  CREATININE 1.52*  CALCIUM 9.1  PROT 6.9  BILITOT 0.7  ALKPHOS 57  ALT 24  AST 34  GLUCOSE 159*   Lab Results  Component Value Date   CHOL 187 07/04/2018   HDL 47 07/04/2018   LDLCALC 121 (H) 07/04/2018   TRIG 97 07/04/2018   No results found for: [facility]   Radiology Studies    Ct Abdomen Pelvis Wo Contrast  Result Date: 06/22/2018 CLINICAL DATA:  Right flank pain radiating to the groin over the last 5 weeks, evaluate for possible kidney stones EXAM: CT  ABDOMEN AND PELVIS WITHOUT CONTRAST TECHNIQUE: Multidetector CT imaging of the abdomen and pelvis was performed following the standard protocol without IV contrast. COMPARISON:  CT abdomen pelvis 03/31/2017 FINDINGS: Lower chest: The lung bases are clear. The heart is within normal limits in size. No pericardial effusion is seen. Hepatobiliary: The liver is unremarkable in the unenhanced state. No calcified gallstones are noted. Pancreas: There is fatty infiltration of much of the pancreas. The pancreatic duct is not dilated. Spleen: The spleen is unremarkable. Adrenals/Urinary Tract: Adrenal glands appear normal. No renal calculi are seen. There is a cyst emanating from the lower pole of the left kidney measuring approximately 5.1 x 4.1 cm, with no complicating features on this unenhanced study. The ureters appear normal in caliber. The urinary bladder is not well distended but no abnormality is seen. Stomach/Bowel: The stomach is completely decompressed and can not be well evaluated. No small bowel distention or edema is seen. There are a few scattered rectosigmoid colon diverticula present.The appendix and terminal ileum are unremarkable. 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 Vascular/Lymphatic: Only mild abdominal aortic atherosclerosis is present. No adenopathy is seen. Reproductive: Prostate is normal in size. Other: No abdominal wall hernia is seen. Musculoskeletal: The lumbar vertebrae are in normal alignment with normal intervertebral disc spaces. There is some degenerative change of the facet joints of L4-5 and L5-S1. The SI joints appear corticated. IMPRESSION: 1. No explanation for the patient's right flank pain is seen. No renal or ureteral calculi are noted. 2. 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 3. The appendix and terminal ileum are  unremarkable. Electronically Signed   By: Dwyane Dee M.D.   On: 06/22/2018 13:21   Ct Angio Chest Pe W And/or Wo Contrast  Result Date: 07/03/2018 CLINICAL DATA:  67 year old male with history of chest discomfort/pressure for the past 2 hours with some pain radiating into the jaw. Dizziness with nausea  and vomiting. Prior history of embolism. EXAM: CT ANGIOGRAPHY CHEST WITH CONTRAST TECHNIQUE: Multidetector CT imaging of the chest was performed using the standard protocol during bolus administration of intravenous contrast. Multiplanar CT image reconstructions and MIPs were obtained to evaluate the vascular anatomy. CONTRAST:  ISOVUE-370 IOPAMIDOL (ISOVUE-370) INJECTION 76% COMPARISON:  None. FINDINGS: Cardiovascular: No filling defect in the pulmonary arterial tree to suggest underlying pulmonary embolism. Heart size is normal. There is no significant pericardial fluid, thickening or pericardial calcification. There is aortic atherosclerosis, as well as atherosclerosis of the great vessels of the mediastinum and the coronary arteries, including calcified atherosclerotic plaque in the left main, left anterior descending and left circumflex coronary arteries. Calcifications of the aortic valve. Mediastinum/Nodes: No pathologically enlarged mediastinal or hilar lymph nodes. Esophagus is unremarkable in appearance. No axillary lymphadenopathy. Lungs/Pleura: Mild mosaic attenuation throughout the lungs, which may suggest the presence of some air trapping. No consolidative airspace disease. No pleural effusions. No suspicious appearing pulmonary nodules or masses are noted. Upper Abdomen: Mild diffuse low attenuation throughout the visualized hepatic parenchyma, indicative of hepatic steatosis. Musculoskeletal: There are no aggressive appearing lytic or blastic lesions noted in the visualized portions of the skeleton. Review of the MIP images confirms the above findings. IMPRESSION: 1. No evidence of pulmonary  embolism. 2. Probable air trapping in the lungs. 3. Aortic atherosclerosis, in addition to left main and 2 vessel coronary artery disease. Please note that although the presence of coronary artery calcium documents the presence of coronary artery disease, the severity of this disease and any potential stenosis cannot be assessed on this non-gated CT examination. Assessment for potential risk factor modification, dietary therapy or pharmacologic therapy may be warranted, if clinically indicated. 4. There are calcifications of the aortic valve. Echocardiographic correlation for evaluation of potential valvular dysfunction may be warranted if clinically indicated. 5. Hepatic steatosis. Aortic Atherosclerosis (ICD10-I70.0). Electronically Signed   By: Trudie Reed M.D.   On: 07/03/2018 19:00   US Renal  Result Date: 07/03/2018 CLINICAL DATA:  Acute kidney injury EXAM: RENAL / URINARY TRACT ULTRASOUND COMPLETE COMPARISON:  CT abdomen pelvis 06/22/2018 FINDINGS: Right Kidney: Renal measurements: 11.0 x 5.9 x 5.1 cm = volume: 170 mL mL . Echogenicity within normal limits. No mass or hydronephrosis visualized. Left Kidney: Renal measurements: 12.4 x 5.1 x 5.6 cm = volume: 182 mL. Echogenicity within normal limits. No mass or hydronephrosis visualized. Lower pole cyst measures 3.2 x 3.4 x 3.7 cm Bladder: Appears normal for degree of bladder distention. IMPRESSION: No hydronephrosis or other finding that would contribute to acute kidney injury. Electronically Signed   By: Deatra Robinson M.D.   On: 07/03/2018 22:08   Dg Chest Portable 1 View  Result Date: 07/03/2018 CLINICAL DATA:  Central chest pain and tightness which shortness-of-breath. EXAM: PORTABLE CHEST 1 VIEW COMPARISON:  10/22/2015 FINDINGS: Lungs are somewhat hypoinflated without focal airspace consolidation or effusion. Cardiomediastinal silhouette and remainder the exam is unchanged. IMPRESSION: No active disease. Electronically Signed   By: Elberta Fortis M.D.   On: 07/03/2018 17:29    EKG     EKG: EKG was personally reviewed and demonstrates: Normal sinus rhythm, rate 69 bpm, with Q waves and nonspecific ST/T changes in inferior leads (also seen on previous tracings).  Telemetry: Telemetry was personally reviewed and demonstrates: Sinus rhythm with heart rates in the 50's to 70's.   Cardiac Imaging    Exercise Tolerance Test 12/03/2015:  Blood pressure demonstrated a hypertensive response to exercise.  There was no ST segment deviation noted during stress.   Negative but non-diagnostic ETT. Patient only achieved 79% of age predicted maximum HR. _______________  Echocardiogram 12/03/2015: Study Conclusions: - Left ventricle: The cavity size was normal. There was mild   concentric hypertrophy. Systolic function was normal. The   estimated ejection fraction was in the range of 55% to 60%. Wall   motion was normal; there were no regional wall motion   abnormalities. Doppler parameters are consistent with abnormal   left ventricular relaxation (grade 1 diastolic dysfunction).   Doppler parameters are consistent with elevated ventricular   end-diastolic filling pressure. - Aortic valve: Transvalvular velocity was within the normal range.   There was no stenosis. - Mitral valve: Transvalvular velocity was within the normal range.   There was no evidence for stenosis. There was trivial   regurgitation. - Right ventricle: The cavity size was mildly dilated. Wall   thickness was normal. Systolic function was normal. - Tricuspid valve: There was no regurgitation. _______________  Cardiac Catheterization 12/13/2005: Results:  1.  Hemodynamic data:      1.  Aortic pressure 121/72.      2.  Left ventricular pressure 114/17.  2.  Left ventriculography:  LV cavity is faintly opacified.  Overall      function is felt to be normal.  EF 60%.  No regional wall motion      abnormality.  3.  Coronary angiography.      1.  Left main  coronary:  Widely patent.      2.  Left anterior descending coronary:  The LAD is widely patent, gives          origin to three diagonal branches.  All are patent.      3.  Circumflex artery:  The circumflex is a moderate size vessel that          gives origin to three obtuse marginals.  Luminal irregularities are          noted in the mid circumflex.  The vessel is widely patent.      4.  Right coronary:  The right coronary artery is dominant and is          normal.  Gives origin to PDA and two small left ventricular          branches.  Conclusions:  1.  Essentially normal coronary arteries.  2.  Normal left ventricular function.  Plan:  Monitor groin.  Home later today if good hemostasis.  Consider GI  workup for chest discomfort.  No further cardiac evaluation.   Assessment & Plan    Chest Pain - Patient presented with substernal chest pain that radiated to jaw/neck with associated palpitations, nausea, and sweating. Patient had normal coronaries on catheterization in 2007 and negative but non-diagnostic ETT in 2017 (did not reach maximum predicted heart rate). - EKG showed no acute ischemic changes. - Troponin negative x3. - Patient has had recurrent episodes of chest pain while here that have resolved with Nitroglycerin. Currently chest pain free.  - Given presentation and known cardiovascular risk factors (HTN, HLD, age, gender, and obesity), consider Lexiscan Myoview. Serum creatinine elevated at 1.59 on admission so may want to avoid contrast at this time. Will discuss with MD.  Hypertension - BP 146/89. - Home Losartan currently being held due to AKI.  Hyperlipidemia - LDL 121 this admission. - Lipitor 40mg  started here.  AKI - Serum creatinine 1.59 on admission. Creatinine was 1.05 two  years ago. - Renal ultrasound showed no hydronephrosis or other findings that would contribute to AKI. - IV fluids per primary team. - Continue to hold home ARB. - Continue to  monitor.  Otherwise, per primary team.  Signed, Scott Parkerallie E Goodrich, Scott Young 07/04/2018, 8:26 AM   Pt seen and examined   I agree with findings as noted by C Irene LimboGoodrich Pt is a 68 yo with history of CP in past   Over past several weeks he has had increased fatigue   He attrib to dx of prostatis   He had been on ABX Yesteday he was vacuumng when he got nauseated  Weak developed chest prssure    Siymptoms continued  Went to urgent care   Sent to ED With FHx of clot had CTA of chest   Neg for PE but did show atherosclerosis of LM, 3 Vessels  Currently with no signif pressure   ON exam Pt obese  In NAD Neck   No JVD   No bruits Lungs are clear to auscul   Cardiac RRR   No rub   No mrumurs Abd is supple Ext are without edema 2+ pulses  Echo shows LVEF and RVEF are normal   Chest prssure   Worrisome for unstable angina  3 V CAD with LM calciifications      I would recomm L heart cath to define anatomy Risks and benefits described   Pt understands and agree s to proceed  Hydrate with 100cc/hour NS    Cr was 1.4 today   Other recomm as noted  Scott Young   For questions or updates, please contact   Please consult [email or web address] for contact info under Cardiology/STEMI.

## 2018-07-04 NOTE — Care Management Note (Signed)
Case Management Note  Patient Details  Name: Scott Young MRN: [id number] Date of Birth: [birth year]  Subjective/Objective:   Pt admitted on 07/03/2018 with unstable angina.  PTA, pt independent, lives with spouse.                 Action/Plan: PCP is Dr Renford Dillsonald Polite.  Wife able to provide care at discharge.  Will continue to follow for dc needs as pt progresses.  Expected Discharge Date:                  Expected Discharge Plan:  Home/Self Care  In-House Referral:     Discharge planning Services  CM Consult  Post Acute Care Choice:    Choice offered to:     DME Arranged:    DME Agency:     HH Arranged:    HH Agency:     Status of Service:  In process, will continue to follow  If discussed at Long Length of Stay Meetings, dates discussed:    Additional Comments:  Glennon Macmerson, Carle Fenech M, RN 07/04/2018, 5:15 PM

## 2018-07-04 NOTE — Progress Notes (Signed)
  Echocardiogram 2D Echocardiogram has been performed.  Scott Young 07/04/2018, 11:23 AM

## 2018-07-04 NOTE — Interval H&P Note (Signed)
Cath Lab Visit (complete for each Cath Lab visit)  Clinical Evaluation Leading to the Procedure:   ACS: Yes.     Non-ACS:    Anginal Classification: CCS III  Anti-ischemic medical therapy: Minimal Therapy (1 class of medications)  Non-Invasive Test Results: No non-invasive testing performed  Prior CABG: No previous CABG      History and Physical Interval Note:  07/04/2018 1:45 PM  Scott Young  has presented today for surgery, with the diagnosis of ua  The various methods of treatment have been discussed with the patient and family. After consideration of risks, benefits and other options for treatment, the patient has consented to  Procedure(s): LEFT HEART CATH AND CORONARY ANGIOGRAPHY (N/A) as a surgical intervention .  The patient's history has been reviewed, patient examined, no change in status, stable for surgery.  I have reviewed the patient's chart and labs.  Questions were answered to the patient's satisfaction.     Lyn Records III

## 2018-07-04 NOTE — CV Procedure (Signed)
   Prolonged chest pain without EKG or cardiac marker abnormality.  Diagnostic coronary angiography via right radial using ultrasound guidance for vascular access.  Luminal irregularities in LAD, with third diagonal containing ostial 50 to 70% narrowing (eccentric).  Second diagonal contains mid vessel 30 to 40% narrowing.  The first diagonal is small and contains diffuse narrowing ostial to proximal.  Widely patent left main.  First obtuse marginal is small and contains irregularities the second obtuse marginal is smaller.  The larger of the 3 obtuse marginals is the third OM that contains 30 to 40% luminal irregularities.  The right coronary contains generalized luminal irregularities but no high-grade obstruction greater than 30%.  Left ventricular end-diastolic pressure is 18 mmHg.  Contrast injection was not performed because of acute kidney injury  Overall impression: The clinical story is concerning.  No focal high-grade obstructive lesion is noted.  The third diagonal is the most severely diseased.  This presentation falls in the category of INOCA.  RECOMMEND: Aggressive secondary risk modification.  Nitroglycerin if angina recurs.  Consider adding Plavix to aspirin for 3 to 6 months.

## 2018-07-04 NOTE — Progress Notes (Addendum)
Progress Note    Scott BucyKenneth Young  ZOX:[id number] DOB: [birth year]  DOA: 07/03/2018 PCP: Scott DillsPolite, Ronald, MD    Brief Narrative:   Chief complaint: Chest pain  Medical records reviewed and are as summarized below:  Scott Young is an 68 y.o. male with past medical history of HTN, HLD, prostatitis  Assessment/Plan:   Principal Problem:   Angina pectoris (HCC) Active Problems:   AKI (acute kidney injury) (HCC)   Prostatitis   HTN (hypertension)   Depression  Angina pectoris, nonobstructive coronary artery disease: Acute.  Patient reported chest pain with relief with nitroglycerin.  Cardiology was consulted and evaluated the patient.  Echo revealed EF of 60 to 65% with grade 1 diastolic dysfunction.  Patient underwent cardiac catheterization which revealed luminal irregularities of the LAD proximal to mid, mild to distal LAD stenosis of 40% with generalized narrowing, and third diagonal contains 60 to 70% ostial narrowing.  Dr. Katrinka Young who performed a cardiac cath recommended aggressive medical management. -Started on Plavix, aspirin, and statin  Acute kidney injury: Creatinine elevated initially up to 1.5 2 on admission.  Patient given IV fluids with creatinine trending downward.  Suspect symptoms could be related with Bactrim. -Holding Bactrim -Continue IV fluid hydration tolerated -Recheck BMP in a.m.  Prostatitis: Acute. Patient had been on Bactrim.   -Discontinue Bactrim due to acute kidney injury -Replace with Levaquin QT interval noted within normal limits  Essential hypertension: Normotensive currently.   - Will restart losartan in a.m. if kidney function improving  Hyperlipidemia: Total cholesterol 187, HDL 47, LDL 121, triglycerides 97.  Goal LDL less than 70. - Started on atorvastatin  Depression -Continue Prozac  Obesity: Body mass index is 38.85 kg/m.  Counseled on the need of weight loss.   Family Communication/Anticipated D/C date and plan/Code Status    DVT prophylaxis: Heparin Code Status: Full Code.  Family Communication: Discussed plan of care with the patient and family present at bedside Disposition Plan: Likely discharge home once medically stable   Medical Consultants:    Cardiology   Anti-Infectives:    None  Subjective:   Patient reports having intermittent chest pains that were relieved with nitroglycerin.  Associated symptoms included nausea.  Objective:    Vitals:   07/03/18 2312 07/04/18 0618 07/04/18 1204 07/04/18 1456  BP: 127/86 (!) 146/89 (!) 154/87 (!) 132/91  Pulse:  67  65  Resp:      Temp:  97.6 F (36.4 C)    TempSrc:  Oral    SpO2:  96%  99%  Weight:  127.2 kg    Height:        Intake/Output Summary (Last 24 hours) at 07/04/2018 1824 Last data filed at 07/04/2018 1545 Gross per 24 hour  Intake 2079.29 ml  Output 1050 ml  Net 1029.29 ml   Filed Weights   07/03/18 1638 07/03/18 2038 07/04/18 0618  Weight: 127.9 kg 128.1 kg 127.2 kg    Exam: Constitutional: NAD, calm, comfortable Eyes: PERRL, lids and conjunctivae normal ENMT: Mucous membranes are moist. Posterior pharynx clear of any exudate or lesions.Normal dentition.  Neck: normal, supple, no masses, no thyromegaly Respiratory: clear to auscultation bilaterally, no wheezing, no crackles. Normal respiratory effort. No accessory muscle use.  Cardiovascular: Regular rate and rhythm, no murmurs / rubs / gallops. No extremity edema. 2+ pedal pulses. No carotid bruits.  Abdomen: no tenderness, no masses palpated. No hepatosplenomegaly. Bowel sounds positive.  Musculoskeletal: no clubbing / cyanosis. No joint deformity upper and lower extremities.  Good ROM, no contractures. Normal muscle tone.  Skin: no rashes, lesions, ulcers. No induration Neurologic: CN 2-12 grossly intact. Sensation intact, DTR normal. Strength 5/5 in all 4.  Psychiatric: Normal judgment and insight. Alert and oriented x 3. Normal mood.    Data Reviewed:   I  have personally reviewed following labs and imaging studies:  Labs: Labs show the following:   Basic Metabolic Panel: Recent Labs  Lab 07/03/18 1649 07/04/18 0759 07/04/18 1502  NA 136 138  --   K 4.2 4.4  --   CL 102 106  --   CO2 21* 21*  --   GLUCOSE 159* 108*  --   BUN 14 12  --   CREATININE 1.52* 1.43* 1.29*  CALCIUM 9.1 8.7*  --    GFR Estimated Creatinine Clearance: 74.7 mL/min (A) (by C-G formula based on SCr of 1.29 mg/dL (H)). Liver Function Tests: Recent Labs  Lab 07/03/18 1649  AST 34  ALT 24  ALKPHOS 57  BILITOT 0.7  PROT 6.9  ALBUMIN 4.0   Recent Labs  Lab 07/03/18 1649  LIPASE 27   No results for input(s): AMMONIA in the last 168 hours. Coagulation profile No results for input(s): INR, PROTIME in the last 168 hours.  CBC: Recent Labs  Lab 07/03/18 1649 07/04/18 1502  WBC 7.7 5.7  NEUTROABS 5.1  --   HGB 13.5 13.2  HCT 41.3 38.6*  MCV 91.8 91.7  PLT 205 178   Cardiac Enzymes: Recent Labs  Lab 07/03/18 2008 07/04/18 0156 07/04/18 0759  TROPONINI <0.03 <0.03 <0.03   BNP (last 3 results) No results for input(s): PROBNP in the last 8760 hours. CBG: No results for input(s): GLUCAP in the last 168 hours. D-Dimer: No results for input(s): DDIMER in the last 72 hours. Hgb A1c: Recent Labs    07/04/18 0156  HGBA1C 6.7*   Lipid Profile: Recent Labs    07/04/18 0156  CHOL 187  HDL 47  LDLCALC 121*  TRIG 97  CHOLHDL 4.0   Thyroid function studies: No results for input(s): TSH, T4TOTAL, T3FREE, THYROIDAB in the last 72 hours.  Invalid input(s): FREET3 Anemia work up: No results for input(s): VITAMINB12, FOLATE, FERRITIN, TIBC, IRON, RETICCTPCT in the last 72 hours. Sepsis Labs: Recent Labs  Lab 07/03/18 1649 07/04/18 1502  WBC 7.7 5.7    Microbiology No results found for this or any previous visit (from the past 240 hour(s)).  Procedures and diagnostic studies:  Ct Angio Chest Pe W And/or Wo Contrast  Result  Date: 07/03/2018 CLINICAL DATA:  68 year old male with history of chest discomfort/pressure for the past 2 hours with some pain radiating into the jaw. Dizziness with nausea and vomiting. Prior history of embolism. EXAM: CT ANGIOGRAPHY CHEST WITH CONTRAST TECHNIQUE: Multidetector CT imaging of the chest was performed using the standard protocol during bolus administration of intravenous contrast. Multiplanar CT image reconstructions and MIPs were obtained to evaluate the vascular anatomy. CONTRAST:  ISOVUE-370 IOPAMIDOL (ISOVUE-370) INJECTION 76% COMPARISON:  None. FINDINGS: Cardiovascular: No filling defect in the pulmonary arterial tree to suggest underlying pulmonary embolism. Heart size is normal. There is no significant pericardial fluid, thickening or pericardial calcification. There is aortic atherosclerosis, as well as atherosclerosis of the great vessels of the mediastinum and the coronary arteries, including calcified atherosclerotic plaque in the left main, left anterior descending and left circumflex coronary arteries. Calcifications of the aortic valve. Mediastinum/Nodes: No pathologically enlarged mediastinal or hilar lymph nodes. Esophagus is unremarkable in  appearance. No axillary lymphadenopathy. Lungs/Pleura: Mild mosaic attenuation throughout the lungs, which may suggest the presence of some air trapping. No consolidative airspace disease. No pleural effusions. No suspicious appearing pulmonary nodules or masses are noted. Upper Abdomen: Mild diffuse low attenuation throughout the visualized hepatic parenchyma, indicative of hepatic steatosis. Musculoskeletal: There are no aggressive appearing lytic or blastic lesions noted in the visualized portions of the skeleton. Review of the MIP images confirms the above findings. IMPRESSION: 1. No evidence of pulmonary embolism. 2. Probable air trapping in the lungs. 3. Aortic atherosclerosis, in addition to left main and 2 vessel coronary artery  disease. Please note that although the presence of coronary artery calcium documents the presence of coronary artery disease, the severity of this disease and any potential stenosis cannot be assessed on this non-gated CT examination. Assessment for potential risk factor modification, dietary therapy or pharmacologic therapy may be warranted, if clinically indicated. 4. There are calcifications of the aortic valve. Echocardiographic correlation for evaluation of potential valvular dysfunction may be warranted if clinically indicated. 5. Hepatic steatosis. Aortic Atherosclerosis (ICD10-I70.0). Electronically Signed   By: Trudie Reedaniel  Entrikin M.D.   On: 07/03/2018 19:00   [place] Renal  Result Date: 07/03/2018 CLINICAL DATA:  Acute kidney injury EXAM: RENAL / URINARY TRACT ULTRASOUND COMPLETE COMPARISON:  CT abdomen pelvis 06/22/2018 FINDINGS: Right Kidney: Renal measurements: 11.0 x 5.9 x 5.1 cm = volume: 170 mL mL . Echogenicity within normal limits. No mass or hydronephrosis visualized. Left Kidney: Renal measurements: 12.4 x 5.1 x 5.6 cm = volume: 182 mL. Echogenicity within normal limits. No mass or hydronephrosis visualized. Lower pole cyst measures 3.2 x 3.4 x 3.7 cm Bladder: Appears normal for degree of bladder distention. IMPRESSION: No hydronephrosis or other finding that would contribute to acute kidney injury. Electronically Signed   By: Deatra RobinsonKevin  Herman M.D.   On: 07/03/2018 22:08   Dg Chest Portable 1 View  Result Date: 07/03/2018 CLINICAL DATA:  Central chest pain and tightness which shortness-of-breath. EXAM: PORTABLE CHEST 1 VIEW COMPARISON:  10/22/2015 FINDINGS: Lungs are somewhat hypoinflated without focal airspace consolidation or effusion. Cardiomediastinal silhouette and remainder the exam is unchanged. IMPRESSION: No active disease. Electronically Signed   By: Elberta Fortisaniel  Boyle M.D.   On: 07/03/2018 17:29    Medications:   . aspirin EC  81 mg Oral QHS  . atorvastatin  40 mg Oral q1800  . [START  ON 07/05/2018] clopidogrel  75 mg Oral Q breakfast  . FLUoxetine  60 mg Oral Daily  . fluticasone  1 spray Each Nare Daily  . heparin  5,000 Units Subcutaneous Q8H  . heparin  5,000 Units Subcutaneous Q8H  . loratadine  10 mg Oral Daily  . [START ON 07/05/2018] sodium chloride flush  3 mL Intravenous Q12H   Continuous Infusions: . [START ON 07/05/2018] sodium chloride    . sodium chloride 0.4 mL/kg/hr (07/04/18 1544)     LOS: 0 days   Rondell A Smith  [facility]   *Please refer to [email or web address], password [email or web address] to get updated schedule on who will round on this patient, as hospitalists switch teams weekly. If 7PM-7AM, please contact night-coverage at [email or web address], password [email or web address] for any overnight needs.

## 2018-07-04 NOTE — Care Management Obs Status (Signed)
MEDICARE OBSERVATION STATUS NOTIFICATION   Patient Details  Name: Scott Young MRN: [id number] Date of Birth: 11-29-1950   Medicare Observation Status Notification Given:  Yes    Glennon Mac, RN 07/04/2018, 5:15 PM

## 2018-07-04 NOTE — Plan of Care (Signed)
  Problem: Education: Goal: Knowledge of General Education information will improve Description Including pain rating scale, medication(s)/side effects and non-pharmacologic comfort measures Outcome: Progressing   Problem: Health Behavior/Discharge Planning: Goal: Ability to manage health-related needs will improve Outcome: Progressing   Problem: Clinical Measurements: Goal: Will remain free from infection Outcome: Progressing Goal: Diagnostic test results will improve Outcome: Progressing Goal: Cardiovascular complication will be avoided Outcome: Progressing   Problem: Clinical Measurements: Goal: Ability to maintain clinical measurements within normal limits will improve Outcome: Completed/Met   Problem: Activity: Goal: Risk for activity intolerance will decrease Outcome: Completed/Met

## 2018-07-04 NOTE — H&P (View-Only) (Signed)
 Cardiology Consult    Patient ID: Scott Young MRN: [id number], DOB/AGE: 69/01/1951   Admit date: 07/03/2018 Date of Consult: 07/04/2018  Primary Physician: Polite, Ronald, MD Primary Cardiologist: Henry W Smith III, MD Requesting Provider: Smith, Rondell, MD  Patient Profile    Raphael Lisby is a 67 y.o. male with a history of hypertension, hyperlipidemia, sleep apnea on CPAP, who is being seen today for the evaluation of chest pain at the request of Dr. Smith.  History of Present Illness    Mr. Matarese is a 67 year old male with the above history who has previously been seen by Dr. Smith. Patient had normal coronaries on cardiac catheterization in 2007. He last saw Dr. Smith in 11/2015 for evaluation of chest pressure and dyspnea. Exercise Tolerance Test was ordered at that time and was negative but considered non-diagnostic because patient did not reach age-predicted maximum heart rate. An echocardiogram was also performed which showed LVEF of 55-60% with grade 1 diastolic dysfunction but no regional wall motion abnormalities.  Patient reports generalized fatigue and weakness over the past 4 to 5 weeks.  He denies any recent fevers or illnesses.  Yesterday, he was vacuuming and after about 20 minutes he felt suddenly very fatigue and developed substernal chest tightness/pressure with associated palpitations, nausea, and sweating.  Patient ranks the chest pain as a 4/10 on the pain scale and states to his neck/jaw. Pain persisted so patient went to the Urgent Care and Urgent Care had patient transported to the ED via EMS. Patient reports pain finally improved after 3 doses of Nitroglycerin.    In the ED: EKG showed no acute ischemic changes compared to prior tracings. Troponin negative x3. Chest x-ray showed no acute findings. Chest CTA showed no evidence of pulmonary embolism but did note aortic atherosclerosis in addition to left main and 2 vessel CAD. BNP normal. CBC unremarkable. Na 136, K  4.2, Glucose 159, SCr 1.52. Patient was admitted for further evaluation.  Patient has had 2 recurrent episodes of chest pain since arriving to [place] both of which resolved with Nitroglycerin. He denies any current pain but continues to feel very fatigued and weak.  Patient denies any history of tobacco use. He does have a family history of "blood clots" on his mothers side. Mother had multiple PEs and died in her 50's. Maternal grandfather had blood clot and died from massive heart attack in his 50's. Maternal uncle died from a blood clot in his heart in his 40's. No known family history of clotting disorders.  Past Medical History   Past Medical History:  Diagnosis Date  . Depression   . H/O seasonal allergies    tx. OTC meds  . Hyperlipidemia   . Hypertension   . Prostatitis   . Sleep apnea    cpap  . Umbilical hernia    resolved -s/p surgical repair    Past Surgical History:  Procedure Laterality Date  . APPENDECTOMY    . CATARACT EXTRACTION Left    30 yrs ago  . COLONOSCOPY      multiple- x1 colon polyp removed  . COLONOSCOPY WITH PROPOFOL N/A 10/22/2014   Procedure: COLONOSCOPY WITH PROPOFOL;  Surgeon: Martin K Johnson, MD;  Location: [facility];  Service: Endoscopy;  Laterality: N/A;  . EYE SURGERY     choriziom removed right eye 12'15  . HERNIA REPAIR     umbilical hernia repair  . TONSILLECTOMY       Allergies  Allergies  Allergen   Reactions  . Codeine Nausea And Vomiting    Inpatient Medications    . aspirin EC  81 mg Oral QHS  . atorvastatin  40 mg Oral q1800  . FLUoxetine  60 mg Oral Daily  . fluticasone  1 spray Each Nare Daily  . heparin  5,000 Units Subcutaneous Q8H  . loratadine  10 mg Oral Daily    Family History    Family History  Problem Relation Age of Onset  . Heart disease Mother   . Cancer Father        colon   He indicated that his mother is deceased. He indicated that his father is deceased.   Social History    Social  History   Socioeconomic History  . Marital status: Married    Spouse name: Not on file  . Number of children: Not on file  . Years of education: Not on file  . Highest education level: Not on file  Occupational History  . Not on file  Social Needs  . Financial resource strain: Not on file  . Food insecurity:    Worry: Not on file    Inability: Not on file  . Transportation needs:    Medical: Not on file    Non-medical: Not on file  Tobacco Use  . Smoking status: Former Smoker  . Smokeless tobacco: Former User    Quit date: 09/23/1970  Substance and Sexual Activity  . Alcohol use: No  . Drug use: No  . Sexual activity: Not on file  Lifestyle  . Physical activity:    Days per week: Not on file    Minutes per session: Not on file  . Stress: Not on file  Relationships  . Social connections:    Talks on phone: Not on file    Gets together: Not on file    Attends religious service: Not on file    Active member of club or organization: Not on file    Attends meetings of clubs or organizations: Not on file    Relationship status: Not on file  . Intimate partner violence:    Fear of current or ex partner: Not on file    Emotionally abused: Not on file    Physically abused: Not on file    Forced sexual activity: Not on file  Other Topics Concern  . Not on file  Social History Narrative  . Not on file     Review of Systems    Review of Systems  Constitutional: Positive for diaphoresis and malaise/fatigue. Negative for chills and fever.  HENT: Negative for congestion and sore throat.   Eyes: Negative for blurred vision and double vision.  Respiratory: Positive for shortness of breath. Negative for cough and hemoptysis.   Cardiovascular: Positive for chest pain and palpitations.  Gastrointestinal: Positive for nausea. Negative for blood in stool and vomiting.  Genitourinary: Negative for hematuria.  Musculoskeletal: Negative for myalgias.  Neurological: Positive for  weakness. Negative for loss of consciousness.  Endo/Heme/Allergies: Positive for environmental allergies. Does not bruise/bleed easily.  Psychiatric/Behavioral: Negative for substance abuse.    Physical Exam    Blood pressure (!) 146/89, pulse 67, temperature 97.6 F (36.4 C), temperature source Oral, resp. rate 18, height 5' 11.25" (1.81 m), weight 127.2 kg, SpO2 96 %.  General: 67 y.o. Obese Caucasian male resting comfortably in no acute distress. Pleasant and cooperative. HEENT: Normal  Neck: Supple. No carotid bruits or JVD appreciated. Lungs: No increased work of breathing. Clear   to auscultation bilaterally. No wheezes, rhonchi, or rales. Heart: RRR. Distinct S1 and S2. II/VI systolic murmur noted. No gallops or rubs appreciated..  Abdomen: Soft, non-distended, and non-tender to palpation. Bowel sounds present. Extremities: No significant lower extremity edema. Radial pulses 2+ and equal bilaterally. Skin: Warm and dry. Neuro: Alert and oriented x3. No focal deficits. Moves all extremities spontaneously. Psych: Normal affect.  Labs    Troponin (Point of Care Test) Recent Labs    07/03/18 1659  TROPIPOC 0.00   Recent Labs    07/03/18 2008 07/04/18 0156  TROPONINI <0.03 <0.03   Lab Results  Component Value Date   WBC 7.7 07/03/2018   HGB 13.5 07/03/2018   HCT 41.3 07/03/2018   MCV 91.8 07/03/2018   PLT 205 07/03/2018    Recent Labs  Lab 07/03/18 1649  NA 136  K 4.2  CL 102  CO2 21*  BUN 14  CREATININE 1.52*  CALCIUM 9.1  PROT 6.9  BILITOT 0.7  ALKPHOS 57  ALT 24  AST 34  GLUCOSE 159*   Lab Results  Component Value Date   CHOL 187 07/04/2018   HDL 47 07/04/2018   LDLCALC 121 (H) 07/04/2018   TRIG 97 07/04/2018   No results found for: DDIMER   Radiology Studies    Ct Abdomen Pelvis Wo Contrast  Result Date: 06/22/2018 CLINICAL DATA:  Right flank pain radiating to the groin over the last 5 weeks, evaluate for possible kidney stones EXAM: CT  ABDOMEN AND PELVIS WITHOUT CONTRAST TECHNIQUE: Multidetector CT imaging of the abdomen and pelvis was performed following the standard protocol without IV contrast. COMPARISON:  CT abdomen pelvis 03/31/2017 FINDINGS: Lower chest: The lung bases are clear. The heart is within normal limits in size. No pericardial effusion is seen. Hepatobiliary: The liver is unremarkable in the unenhanced state. No calcified gallstones are noted. Pancreas: There is fatty infiltration of much of the pancreas. The pancreatic duct is not dilated. Spleen: The spleen is unremarkable. Adrenals/Urinary Tract: Adrenal glands appear normal. No renal calculi are seen. There is a cyst emanating from the lower pole of the left kidney measuring approximately 5.1 x 4.1 cm, with no complicating features on this unenhanced study. The ureters appear normal in caliber. The urinary bladder is not well distended but no abnormality is seen. Stomach/Bowel: The stomach is completely decompressed and can not be well evaluated. No small bowel distention or edema is seen. There are a few scattered rectosigmoid colon diverticula present.The appendix and terminal ileum are unremarkable. 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 Vascular/Lymphatic: Only mild abdominal aortic atherosclerosis is present. No adenopathy is seen. Reproductive: Prostate is normal in size. Other: No abdominal wall hernia is seen. Musculoskeletal: The lumbar vertebrae are in normal alignment with normal intervertebral disc spaces. There is some degenerative change of the facet joints of L4-5 and L5-S1. The SI joints appear corticated. IMPRESSION: 1. No explanation for the patient's right flank pain is seen. No renal or ureteral calculi are noted. 2. 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 3. The appendix and terminal ileum are  unremarkable. Electronically Signed   By: Paul  Scott M.D.   On: 06/22/2018 13:21   Ct Angio Chest Pe W And/or Wo Contrast  Result Date: 07/03/2018 CLINICAL DATA:  67-year-old male with history of chest discomfort/pressure for the past 2 hours with some pain radiating into the jaw. Dizziness with nausea   and vomiting. Prior history of embolism. EXAM: CT ANGIOGRAPHY CHEST WITH CONTRAST TECHNIQUE: Multidetector CT imaging of the chest was performed using the standard protocol during bolus administration of intravenous contrast. Multiplanar CT image reconstructions and MIPs were obtained to evaluate the vascular anatomy. CONTRAST:  100mL ISOVUE-370 IOPAMIDOL (ISOVUE-370) INJECTION 76% COMPARISON:  None. FINDINGS: Cardiovascular: No filling defect in the pulmonary arterial tree to suggest underlying pulmonary embolism. Heart size is normal. There is no significant pericardial fluid, thickening or pericardial calcification. There is aortic atherosclerosis, as well as atherosclerosis of the great vessels of the mediastinum and the coronary arteries, including calcified atherosclerotic plaque in the left main, left anterior descending and left circumflex coronary arteries. Calcifications of the aortic valve. Mediastinum/Nodes: No pathologically enlarged mediastinal or hilar lymph nodes. Esophagus is unremarkable in appearance. No axillary lymphadenopathy. Lungs/Pleura: Mild mosaic attenuation throughout the lungs, which may suggest the presence of some air trapping. No consolidative airspace disease. No pleural effusions. No suspicious appearing pulmonary nodules or masses are noted. Upper Abdomen: Mild diffuse low attenuation throughout the visualized hepatic parenchyma, indicative of hepatic steatosis. Musculoskeletal: There are no aggressive appearing lytic or blastic lesions noted in the visualized portions of the skeleton. Review of the MIP images confirms the above findings. IMPRESSION: 1. No evidence of pulmonary  embolism. 2. Probable air trapping in the lungs. 3. Aortic atherosclerosis, in addition to left main and 2 vessel coronary artery disease. Please note that although the presence of coronary artery calcium documents the presence of coronary artery disease, the severity of this disease and any potential stenosis cannot be assessed on this non-gated CT examination. Assessment for potential risk factor modification, dietary therapy or pharmacologic therapy may be warranted, if clinically indicated. 4. There are calcifications of the aortic valve. Echocardiographic correlation for evaluation of potential valvular dysfunction may be warranted if clinically indicated. 5. Hepatic steatosis. Aortic Atherosclerosis (ICD10-I70.0). Electronically Signed   By: Daniel  Entrikin M.D.   On: 07/03/2018 19:00   Us Renal  Result Date: 07/03/2018 CLINICAL DATA:  Acute kidney injury EXAM: RENAL / URINARY TRACT ULTRASOUND COMPLETE COMPARISON:  CT abdomen pelvis 06/22/2018 FINDINGS: Right Kidney: Renal measurements: 11.0 x 5.9 x 5.1 cm = volume: 170 mL mL . Echogenicity within normal limits. No mass or hydronephrosis visualized. Left Kidney: Renal measurements: 12.4 x 5.1 x 5.6 cm = volume: 182 mL. Echogenicity within normal limits. No mass or hydronephrosis visualized. Lower pole cyst measures 3.2 x 3.4 x 3.7 cm Bladder: Appears normal for degree of bladder distention. IMPRESSION: No hydronephrosis or other finding that would contribute to acute kidney injury. Electronically Signed   By: Kevin  Herman M.D.   On: 07/03/2018 22:08   Dg Chest Portable 1 View  Result Date: 07/03/2018 CLINICAL DATA:  Central chest pain and tightness which shortness-of-breath. EXAM: PORTABLE CHEST 1 VIEW COMPARISON:  10/22/2015 FINDINGS: Lungs are somewhat hypoinflated without focal airspace consolidation or effusion. Cardiomediastinal silhouette and remainder the exam is unchanged. IMPRESSION: No active disease. Electronically Signed   By: Daniel   Boyle M.D.   On: 07/03/2018 17:29    EKG     EKG: EKG was personally reviewed and demonstrates: Normal sinus rhythm, rate 69 bpm, with Q waves and nonspecific ST/T changes in inferior leads (also seen on previous tracings).  Telemetry: Telemetry was personally reviewed and demonstrates: Sinus rhythm with heart rates in the 50's to 70's.   Cardiac Imaging    Exercise Tolerance Test 12/03/2015:  Blood pressure demonstrated a hypertensive response to exercise.    There was no ST segment deviation noted during stress.   Negative but non-diagnostic ETT. Patient only achieved 79% of age predicted maximum HR. _______________  Echocardiogram 12/03/2015: Study Conclusions: - Left ventricle: The cavity size was normal. There was mild   concentric hypertrophy. Systolic function was normal. The   estimated ejection fraction was in the range of 55% to 60%. Wall   motion was normal; there were no regional wall motion   abnormalities. Doppler parameters are consistent with abnormal   left ventricular relaxation (grade 1 diastolic dysfunction).   Doppler parameters are consistent with elevated ventricular   end-diastolic filling pressure. - Aortic valve: Transvalvular velocity was within the normal range.   There was no stenosis. - Mitral valve: Transvalvular velocity was within the normal range.   There was no evidence for stenosis. There was trivial   regurgitation. - Right ventricle: The cavity size was mildly dilated. Wall   thickness was normal. Systolic function was normal. - Tricuspid valve: There was no regurgitation. _______________  Cardiac Catheterization 12/13/2005: Results:  1.  Hemodynamic data:      1.  Aortic pressure 121/72.      2.  Left ventricular pressure 114/17.  2.  Left ventriculography:  LV cavity is faintly opacified.  Overall      function is felt to be normal.  EF 60%.  No regional wall motion      abnormality.  3.  Coronary angiography.      1.  Left main  coronary:  Widely patent.      2.  Left anterior descending coronary:  The LAD is widely patent, gives          origin to three diagonal branches.  All are patent.      3.  Circumflex artery:  The circumflex is a moderate size vessel that          gives origin to three obtuse marginals.  Luminal irregularities are          noted in the mid circumflex.  The vessel is widely patent.      4.  Right coronary:  The right coronary artery is dominant and is          normal.  Gives origin to PDA and two small left ventricular          branches.  Conclusions:  1.  Essentially normal coronary arteries.  2.  Normal left ventricular function.  Plan:  Monitor groin.  Home later today if good hemostasis.  Consider GI  workup for chest discomfort.  No further cardiac evaluation.   Assessment & Plan    Chest Pain - Patient presented with substernal chest pain that radiated to jaw/neck with associated palpitations, nausea, and sweating. Patient had normal coronaries on catheterization in 2007 and negative but non-diagnostic ETT in 2017 (did not reach maximum predicted heart rate). - EKG showed no acute ischemic changes. - Troponin negative x3. - Patient has had recurrent episodes of chest pain while here that have resolved with Nitroglycerin. Currently chest pain free.  - Given presentation and known cardiovascular risk factors (HTN, HLD, age, gender, and obesity), consider Lexiscan Myoview. Serum creatinine elevated at 1.59 on admission so may want to avoid contrast at this time. Will discuss with MD.  Hypertension - BP 146/89. - Home Losartan currently being held due to AKI.  Hyperlipidemia - LDL 121 this admission. - Lipitor 40mg started here.  AKI - Serum creatinine 1.59 on admission. Creatinine was 1.05 two   years ago. - Renal ultrasound showed no hydronephrosis or other findings that would contribute to AKI. - IV fluids per primary team. - Continue to hold home ARB. - Continue to  monitor.  Otherwise, per primary team.  Signed, Callie E Goodrich, PA-C 07/04/2018, 8:26 AM   Pt seen and examined   I agree with findings as noted by C Goodrich Pt is a 67 yo with history of CP in past   Over past several weeks he has had increased fatigue   He attrib to dx of prostatis   He had been on ABX Yesteday he was vacuumng when he got nauseated  Weak developed chest prssure    Siymptoms continued  Went to urgent care   Sent to ED With FHx of clot had CTA of chest   Neg for PE but did show atherosclerosis of LM, 3 Vessels  Currently with no signif pressure   ON exam Pt obese  In NAD Neck   No JVD   No bruits Lungs are clear to auscul   Cardiac RRR   No rub   No mrumurs Abd is supple Ext are without edema 2+ pulses  Echo shows LVEF and RVEF are normal   Chest prssure   Worrisome for unstable angina  3 V CAD with LM calciifications      I would recomm L heart cath to define anatomy Risks and benefits described   Pt understands and agree s to proceed  Hydrate with 100cc/hour NS    Cr was 1.4 today   Other recomm as noted      For questions or updates, please contact   Please consult [email or web address] for contact info under Cardiology/STEMI.  

## 2018-07-05 LAB — CBC WITH DIFFERENTIAL/PLATELET
Abs Immature Granulocytes: 0.01 10*3/uL (ref 0.00–0.07)
Basophils Absolute: 0 10*3/uL (ref 0.0–0.1)
Basophils Relative: 1 %
EOS ABS: 0.3 10*3/uL (ref 0.0–0.5)
Eosinophils Relative: 4 %
HCT: 40.3 % (ref 39.0–52.0)
Hemoglobin: 13.1 g/dL (ref 13.0–17.0)
Immature Granulocytes: 0 %
Lymphocytes Relative: 33 %
Lymphs Abs: 2 10*3/uL (ref 0.7–4.0)
MCH: 29.9 pg (ref 26.0–34.0)
MCHC: 32.5 g/dL (ref 30.0–36.0)
MCV: 92 fL (ref 80.0–100.0)
Monocytes Absolute: 0.4 10*3/uL (ref 0.1–1.0)
Monocytes Relative: 7 %
Neutro Abs: 3.4 10*3/uL (ref 1.7–7.7)
Neutrophils Relative %: 55 %
PLATELETS: 169 10*3/uL (ref 150–400)
RBC: 4.38 MIL/uL (ref 4.22–5.81)
RDW: 13 % (ref 11.5–15.5)
WBC: 6.1 10*3/uL (ref 4.0–10.5)
nRBC: 0 % (ref 0.0–0.2)

## 2018-07-05 LAB — BASIC METABOLIC PANEL
Anion gap: 8 (ref 5–15)
BUN: 15 mg/dL (ref 8–23)
CO2: 22 mmol/L (ref 22–32)
Calcium: 8.6 mg/dL — ABNORMAL LOW (ref 8.9–10.3)
Chloride: 107 mmol/L (ref 98–111)
Creatinine, Ser: 1.23 mg/dL (ref 0.61–1.24)
GFR calc Af Amer: >60 mL/min (ref >60)
GFR calc non Af Amer: 60 mL/min — ABNORMAL LOW (ref >60)
Glucose, Bld: 115 mg/dL — ABNORMAL HIGH (ref 70–99)
POTASSIUM: 4 mmol/L (ref 3.5–5.1)
Sodium: 137 mmol/L (ref 135–145)

## 2018-07-05 MED ORDER — LEVOFLOXACIN 500 MG PO TABS: 500.0000 mg | ORAL_TABLET | Freq: Every day | ORAL | 0 refills | Status: DC

## 2018-07-05 MED ORDER — NITROGLYCERIN 0.4 MG SL SUBL: 0.4000 mg | SUBLINGUAL_TABLET | SUBLINGUAL | 1 refills | Status: DC | PRN

## 2018-07-05 MED ORDER — ATORVASTATIN CALCIUM 40 MG PO TABS: 40.0000 mg | ORAL_TABLET | Freq: Every day | ORAL | 1 refills | Status: DC

## 2018-07-05 MED ORDER — CLOPIDOGREL BISULFATE 75 MG PO TABS: 75.0000 mg | ORAL_TABLET | Freq: Every day | ORAL | 2 refills | Status: DC

## 2018-07-05 NOTE — Discharge Summary (Addendum)
Physician Discharge Summary  Scott Young ZOX:[id number] DOB: 1951/02/22 DOA: 07/03/2018  PCP: Renford Dills, MD  Admit date: 07/03/2018 Discharge date: 07/05/2018  Admitted From: home Disposition:  home  Recommendations for Outpatient Follow-up:  1. Follow up with PCP in 1-2 weeks 2. Follow up with Cardiology as scheduled  Home Health: none Equipment/Devices: none  Discharge Condition: stable CODE STATUS: Full code Diet recommendation: heart healthy  HPI: Per admitting MD, Scott Young is a 68 y.o. male with medical history significant of hypertension, hyperlipidemia, recently diagnosed prostatitis on Bactrim presenting to the hospital via EMS for evaluation of chest pain.  Patient states at around 3 PM this afternoon he was vacuuming and experience weakness all over, shortness of breath, diaphoresis, nausea, and substernal chest pressure.  He also had associated tingling in both of his arms and hands.  He then went to [facility] urgent care and was sent here from there.  Chest pain resolved after receiving nitroglycerin by EMS and in the ED.  States he smoked cigarettes from the age of [age] but not since then.  States his maternal grandfather had a blood clot and MI and died at age 69.  His maternal uncle had a blood clot and died at age 90.  His mother had a blood clot and died at age 75.  Patient states his oral intake is good.  States he was recently diagnosed with prostatitis and started on Bactrim 4 days ago for a 30-day course by urology.  Reports having nocturia for the past 4 weeks.  Hospital Course: Angina pectoris, nonobstructive coronary artery disease Acute.  Patient reported chest pain with relief with nitroglycerin.  Cardiology was consulted and evaluated the patient.  Echo revealed EF of 60 to 65% with grade 1 diastolic dysfunction.  Patient underwent cardiac catheterization which revealed luminal irregularities of the LAD proximal to mid, mild to distal LAD stenosis of 40%  with generalized narrowing, and third diagonal contains 60 to 70% ostial narrowing.  Dr. Katrinka Blazing who performed a cardiac cath recommended aggressive medical management. Started on Plavix, aspirin, and statin Acute kidney injury Creatinine elevated initially up to 1.5 2 on admission.  Patient given IV fluids with creatinine trending downward.  Suspect symptoms could be related with Bactrim. Holding Bactrim, switched to Levaquin. Cr improved with fluids Prostatitis Acute. Patient had been on Bactrim, Discontinue Bactrim due to acute kidney injury, Replace with Levaquin QT interval noted within normal limits Essential hypertension - continue home medications Hyperlipidemia Total cholesterol 187, HDL 47, LDL 121, triglycerides 97.  Goal LDL less than 70. Started on atorvastatin, has been intolerant of it in the past but could potentially take it 3 days a week.  This was discussed with patient Depression -Continue Prozac Obesity Body mass index is 38.85 kg/m.   Would strongly benefit from weight loss  Discharge Diagnoses:  Principal Problem:   Angina pectoris (HCC) Active Problems:   AKI (acute kidney injury) (HCC)   Prostatitis   HTN (hypertension)   Depression   Chest pain     Discharge Instructions   Allergies as of 07/05/2018      Reactions   Codeine Nausea And Vomiting      Medication List    STOP taking these medications   sulfamethoxazole-trimethoprim 800-160 MG tablet Commonly known as:  BACTRIM DS,SEPTRA DS     TAKE these medications   aspirin 81 MG tablet Take 81 mg by mouth at bedtime.   atorvastatin 40 MG tablet Commonly known as:  LIPITOR  Take 1 tablet (40 mg total) by mouth daily at 6 PM.   clopidogrel 75 MG tablet Commonly known as:  PLAVIX Take 1 tablet (75 mg total) by mouth daily with breakfast.   fexofenadine 180 MG tablet Commonly known as:  ALLEGRA Take 180 mg by mouth daily.   FLUoxetine 20 MG capsule Commonly known as:  PROZAC Take 60 mg by mouth  daily.   fluticasone 50 MCG/ACT nasal spray Commonly known as:  FLONASE Place 1 spray into both nostrils daily.   levofloxacin 500 MG tablet Commonly known as:  LEVAQUIN Take 1 tablet (500 mg total) by mouth daily.   losartan 25 MG tablet Commonly known as:  COZAAR Take 25 mg by mouth daily.   meloxicam 15 MG tablet Commonly known as:  MOBIC Take 15 mg by mouth daily as needed for pain.   metFORMIN 1000 MG tablet Commonly known as:  GLUCOPHAGE Take 1,000 mg by mouth daily with breakfast.   naproxen 500 MG tablet Commonly known as:  NAPROSYN Take 500 mg by mouth 2 (two) times daily as needed (for pain).   nitroGLYCERIN 0.4 MG SL tablet Commonly known as:  NITROSTAT Place 1 tablet (0.4 mg total) under the tongue every 5 (five) minutes as needed for chest pain.   triamcinolone cream 0.1 % Commonly known as:  KENALOG Apply 1 application topically daily as needed (for irritation).      Follow-up Information    [facility] [address] Follow up.   Specialty:  Cardiology Why:  You have a hospital follow-up scheduled for 07/18/2018 at 2:15pm. If this date and time do not work for you, please call our office to re-schedule. Contact information: [address] [place] [address] [phone]          Consultations:  Cardiology   Procedures/Studies:  Cath  Luminal irregularities in the LAD proximal to mid.  Calcification is noted in the proximal vessel on cine fluoroscopy.  Mid to distal LAD up to 40 % generalized narrowing.  The third diagonal contains eccentric 60 to 70% ostial narrowing.  Circumflex gives origin to 3 obtuse marginal branches.  The larger branch, OM 3 contains 30% generalized proximal to mid narrowing.  Right coronary artery contains luminal irregularities up to 30% proximal to distal.  Upper normal LVEDP at 18 mmHg.  RECOMMENDATIONS:   Angina pectoris/presumed ischemia without obstructive coronary artery  disease (INOCA).  Aggressive secondary risk modification.    Nitroglycerin use for recurrent anginal complaints.  Consider addition of beta-blocker/ARB therapy to enhance CV risk reduction.  Close clinical follow-up.   2D echo  Impressions: - Compared to a prior study in 2017, the LVEF is unchanged. 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    Ct Abdomen Pelvis Wo Contrast  Result Date: 06/22/2018 CLINICAL DATA:  Right flank pain radiating to the groin over the last 5 weeks, evaluate for possible kidney stones EXAM: CT ABDOMEN AND PELVIS WITHOUT CONTRAST TECHNIQUE: Multidetector CT imaging of the abdomen and pelvis was performed following the standard protocol without IV contrast. COMPARISON:  CT abdomen pelvis 03/31/2017 FINDINGS: Lower chest: The lung bases are clear. The heart is within normal limits in size. No pericardial effusion is seen. Hepatobiliary: The liver is unremarkable in the unenhanced state. No calcified gallstones are noted. Pancreas: There is fatty infiltration of much of the pancreas. The pancreatic duct is not dilated. Spleen: The spleen is unremarkable. Adrenals/Urinary Tract: Adrenal glands appear normal. No renal calculi are  seen. There is a cyst emanating from the lower pole of the left kidney measuring approximately 5.1 x 4.1 cm, with no complicating features on this unenhanced study. The ureters appear normal in caliber. The urinary bladder is not well distended but no abnormality is seen. Stomach/Bowel: The stomach is completely decompressed and can not be well evaluated. No small bowel distention or edema is seen. There are a few scattered rectosigmoid colon diverticula present.The appendix and terminal ileum are unremarkable. 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 Vascular/Lymphatic: Only mild abdominal aortic atherosclerosis is present. No adenopathy is seen. Reproductive: Prostate is normal  in size. Other: No abdominal wall hernia is seen. Musculoskeletal: The lumbar vertebrae are in normal alignment with normal intervertebral disc spaces. There is some degenerative change of the facet joints of L4-5 and L5-S1. The SI joints appear corticated. IMPRESSION: 1. No explanation for the patient's right flank pain is seen. No renal or ureteral calculi are noted. 2. 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 3. The appendix and terminal ileum are unremarkable. Electronically Signed   By: Dwyane DeePaul  Barry M.D.   On: 06/22/2018 13:21   Ct Angio Chest Pe W And/or Wo Contrast  Result Date: 07/03/2018 CLINICAL DATA:  68 year old male with history of chest discomfort/pressure for the past 2 hours with some pain radiating into the jaw. Dizziness with nausea and vomiting. Prior history of embolism. EXAM: CT ANGIOGRAPHY CHEST WITH CONTRAST TECHNIQUE: Multidetector CT imaging of the chest was performed using the standard protocol during bolus administration of intravenous contrast. Multiplanar CT image reconstructions and MIPs were obtained to evaluate the vascular anatomy. CONTRAST:  100mL ISOVUE-370 IOPAMIDOL (ISOVUE-370) INJECTION 76% COMPARISON:  None. FINDINGS: Cardiovascular: No filling defect in the pulmonary arterial tree to suggest underlying pulmonary embolism. Heart size is normal. There is no significant pericardial fluid, thickening or pericardial calcification. There is aortic atherosclerosis, as well as atherosclerosis of the great vessels of the mediastinum and the coronary arteries, including calcified atherosclerotic plaque in the left main, left anterior descending and left circumflex coronary arteries. Calcifications of the aortic valve. Mediastinum/Nodes: No pathologically enlarged mediastinal or hilar lymph nodes. Esophagus is unremarkable in appearance. No axillary lymphadenopathy. Lungs/Pleura: Mild mosaic attenuation  throughout the lungs, which may suggest the presence of some air trapping. No consolidative airspace disease. No pleural effusions. No suspicious appearing pulmonary nodules or masses are noted. Upper Abdomen: Mild diffuse low attenuation throughout the visualized hepatic parenchyma, indicative of hepatic steatosis. Musculoskeletal: There are no aggressive appearing lytic or blastic lesions noted in the visualized portions of the skeleton. Review of the MIP images confirms the above findings. IMPRESSION: 1. No evidence of pulmonary embolism. 2. Probable air trapping in the lungs. 3. Aortic atherosclerosis, in addition to left main and 2 vessel coronary artery disease. Please note that although the presence of coronary artery calcium documents the presence of coronary artery disease, the severity of this disease and any potential stenosis cannot be assessed on this non-gated CT examination. Assessment for potential risk factor modification, dietary therapy or pharmacologic therapy may be warranted, if clinically indicated. 4. There are calcifications of the aortic valve. Echocardiographic correlation for evaluation of potential valvular dysfunction may be warranted if clinically indicated. 5. Hepatic steatosis. Aortic Atherosclerosis (ICD10-I70.0). Electronically Signed   By: Trudie Reedaniel  Entrikin M.D.   On: 07/03/2018 19:00   [place] Renal  Result Date: 07/03/2018 CLINICAL DATA:  Acute kidney injury  EXAM: RENAL / URINARY TRACT ULTRASOUND COMPLETE COMPARISON:  CT abdomen pelvis 06/22/2018 FINDINGS: Right Kidney: Renal measurements: 11.0 x 5.9 x 5.1 cm = volume: 170 mL mL . Echogenicity within normal limits. No mass or hydronephrosis visualized. Left Kidney: Renal measurements: 12.4 x 5.1 x 5.6 cm = volume: 182 mL. Echogenicity within normal limits. No mass or hydronephrosis visualized. Lower pole cyst measures 3.2 x 3.4 x 3.7 cm Bladder: Appears normal for degree of bladder distention. IMPRESSION: No hydronephrosis or  other finding that would contribute to acute kidney injury. Electronically Signed   By: Deatra Robinson M.D.   On: 07/03/2018 22:08   Dg Chest Portable 1 View  Result Date: 07/03/2018 CLINICAL DATA:  Central chest pain and tightness which shortness-of-breath. EXAM: PORTABLE CHEST 1 VIEW COMPARISON:  10/22/2015 FINDINGS: Lungs are somewhat hypoinflated without focal airspace consolidation or effusion. Cardiomediastinal silhouette and remainder the exam is unchanged. IMPRESSION: No active disease. Electronically Signed   By: Elberta Fortis M.D.   On: 07/03/2018 17:29      Subjective: - no chest pain, shortness of breath, no abdominal pain, nausea or vomiting.   Discharge Exam: Vitals:   07/04/18 2030 07/05/18 0500  BP: 127/79 (!) 151/88  Pulse: 64 63  Resp: 18 18  Temp: 98.5 F (36.9 C) 98.4 F (36.9 C)  SpO2: 96% 95%    General: Pt is alert, awake, not in acute distress Cardiovascular: RRR Respiratory: no wheezing Extremities: no edema    The results of significant diagnostics from this hospitalization (including imaging, microbiology, ancillary and laboratory) are listed below for reference.     Microbiology: No results found for this or any previous visit (from the past 240 hour(s)).   Labs: BNP (last 3 results) Recent Labs    07/03/18 1649  BNP 31.3   Basic Metabolic Panel: Recent Labs  Lab 07/03/18 1649 07/04/18 0759 07/04/18 1502 07/05/18 0525  NA 136 138  --  137  K 4.2 4.4  --  4.0  CL 102 106  --  107  CO2 21* 21*  --  22  GLUCOSE 159* 108*  --  115*  BUN 14 12  --  15  CREATININE 1.52* 1.43* 1.29* 1.23  CALCIUM 9.1 8.7*  --  8.6*   Liver Function Tests: Recent Labs  Lab 07/03/18 1649  AST 34  ALT 24  ALKPHOS 57  BILITOT 0.7  PROT 6.9  ALBUMIN 4.0   Recent Labs  Lab 07/03/18 1649  LIPASE 27   No results for input(s): AMMONIA in the last 168 hours. CBC: Recent Labs  Lab 07/03/18 1649 07/04/18 1502 07/05/18 0525  WBC 7.7 5.7 6.1    NEUTROABS 5.1  --  3.4  HGB 13.5 13.2 13.1  HCT 41.3 38.6* 40.3  MCV 91.8 91.7 92.0  PLT 205 178 169   Cardiac Enzymes: Recent Labs  Lab 07/03/18 2008 07/04/18 0156 07/04/18 0759  TROPONINI <0.03 <0.03 <0.03   BNP: Invalid input(s): POCBNP CBG: No results for input(s): GLUCAP in the last 168 hours. D-Dimer No results for input(s): DDIMER in the last 72 hours. Hgb A1c Recent Labs    07/04/18 0156  HGBA1C 6.7*   Lipid Profile Recent Labs    07/04/18 0156  CHOL 187  HDL 47  LDLCALC 121*  TRIG 97  CHOLHDL 4.0   Thyroid function studies No results for input(s): TSH, T4TOTAL, T3FREE, THYROIDAB in the last 72 hours.  Invalid input(s): FREET3 Anemia work up No results for input(s): VITAMINB12,  FOLATE, FERRITIN, TIBC, IRON, RETICCTPCT in the last 72 hours. Urinalysis    Component Value Date/Time   COLORURINE YELLOW 12/06/2011 1133   APPEARANCEUR CLEAR 12/06/2011 1133   LABSPEC 1.010 12/06/2011 1133   PHURINE 6.5 12/06/2011 1133   GLUCOSEU NEG 12/06/2011 1133   HGBUR NEG 12/06/2011 1133   BILIRUBINUR NEG 12/06/2011 1133   KETONESUR NEG 12/06/2011 1133   PROTEINUR NEG 12/06/2011 1133   UROBILINOGEN 0.2 12/06/2011 1133   NITRITE NEG 12/06/2011 1133   LEUKOCYTESUR NEG 12/06/2011 1133   Sepsis Labs Invalid input(s): PROCALCITONIN,  WBC,  LACTICIDVEN   Time coordinating discharge: 35 minutes  SIGNED:  Pamella Pert, MD  [facility] 07/05/2018, 12:44 PM

## 2018-07-05 NOTE — Progress Notes (Signed)
Progress Note  Patient Name: Christo Witts Date of Encounter: 07/05/2018  Primary Cardiologist: Lesleigh Noe, MD   Subjective   Feeling better  No CP  No SOB   Inpatient Medications    Scheduled Meds: . aspirin EC  81 mg Oral QHS  . atorvastatin  40 mg Oral q1800  . clopidogrel  75 mg Oral Q breakfast  . FLUoxetine  60 mg Oral Daily  . fluticasone  1 spray Each Nare Daily  . heparin  5,000 Units Subcutaneous Q8H  . levofloxacin  500 mg Oral Daily  . loratadine  10 mg Oral Daily  . sodium chloride flush  3 mL Intravenous Q12H   Continuous Infusions: . sodium chloride    . sodium chloride 0.4 mL/kg/hr (07/04/18 1544)   PRN Meds: sodium chloride, acetaminophen, nitroGLYCERIN, ondansetron (ZOFRAN) IV, sodium chloride flush   Vital Signs    Vitals:   07/04/18 1456 07/04/18 2014 07/04/18 2030 07/05/18 0500  BP: (!) 132/91 (!) 141/82 127/79 (!) 151/88  Pulse: 65  64 63  Resp:   18 18  Temp:   98.5 F (36.9 C) 98.4 F (36.9 C)  TempSrc:   Oral Oral  SpO2: 99%  96% 95%  Weight:    126.6 kg  Height:        Intake/Output Summary (Last 24 hours) at 07/05/2018 0851 Last data filed at 07/04/2018 2300 Gross per 24 hour  Intake 1020.82 ml  Output 1075 ml  Net -54.18 ml   Last 3 Weights 07/05/2018 07/04/2018 07/03/2018  Weight (lbs) 279 lb 3.2 oz 280 lb 8 oz 282 lb 4.8 oz  Weight (kg) 126.644 kg 127.234 kg 128.05 kg      Telemetry    SR  - Personally Reviewed  ECG      Physical Exam   GEN: No acute distress.   Neck: No JVD Cardiac: RRR, no murmurs, rubs, or gallops.  R wrist without hematoma Respiratory: Clear to auscultation bilaterally. GI: Soft, nontender, non-distended  MS: No edema; No deformity. Neuro:  Nonfocal  Psych: Normal affect   Labs    Chemistry Recent Labs  Lab 07/03/18 1649 07/04/18 0759 07/04/18 1502 07/05/18 0525  NA 136 138  --  137  K 4.2 4.4  --  4.0  CL 102 106  --  107  CO2 21* 21*  --  22  GLUCOSE 159* 108*  --  115*   BUN 14 12  --  15  CREATININE 1.52* 1.43* 1.29* 1.23  CALCIUM 9.1 8.7*  --  8.6*  PROT 6.9  --   --   --   ALBUMIN 4.0  --   --   --   AST 34  --   --   --   ALT 24  --   --   --   ALKPHOS 57  --   --   --   BILITOT 0.7  --   --   --   GFRNONAA 46* 50* 57* 60*  GFRAA 54* 58* >60 >60  ANIONGAP 13 11  --  8     Hematology Recent Labs  Lab 07/03/18 1649 07/04/18 1502 07/05/18 0525  WBC 7.7 5.7 6.1  RBC 4.50 4.21* 4.38  HGB 13.5 13.2 13.1  HCT 41.3 38.6* 40.3  MCV 91.8 91.7 92.0  MCH 30.0 31.4 29.9  MCHC 32.7 34.2 32.5  RDW 12.9 13.2 13.0  PLT 205 178 169    Cardiac Enzymes Recent Labs  Lab 07/03/18 2008 07/04/18 0156 07/04/18 0759  TROPONINI <0.03 <0.03 <0.03    Recent Labs  Lab 07/03/18 1659  TROPIPOC 0.00     BNP Recent Labs  Lab 07/03/18 1649  BNP 31.3     DDimer No results for input(s): DDIMER in the last 168 hours.   Radiology    Ct Angio Chest Pe W And/or Wo Contrast  Result Date: 07/03/2018 CLINICAL DATA:  68 year old male with history of chest discomfort/pressure for the past 2 hours with some pain radiating into the jaw. Dizziness with nausea and vomiting. Prior history of embolism. EXAM: CT ANGIOGRAPHY CHEST WITH CONTRAST TECHNIQUE: Multidetector CT imaging of the chest was performed using the standard protocol during bolus administration of intravenous contrast. Multiplanar CT image reconstructions and MIPs were obtained to evaluate the vascular anatomy. CONTRAST:  ISOVUE-370 IOPAMIDOL (ISOVUE-370) INJECTION 76% COMPARISON:  None. FINDINGS: Cardiovascular: No filling defect in the pulmonary arterial tree to suggest underlying pulmonary embolism. Heart size is normal. There is no significant pericardial fluid, thickening or pericardial calcification. There is aortic atherosclerosis, as well as atherosclerosis of the great vessels of the mediastinum and the coronary arteries, including calcified atherosclerotic plaque in the left main, left  anterior descending and left circumflex coronary arteries. Calcifications of the aortic valve. Mediastinum/Nodes: No pathologically enlarged mediastinal or hilar lymph nodes. Esophagus is unremarkable in appearance. No axillary lymphadenopathy. Lungs/Pleura: Mild mosaic attenuation throughout the lungs, which may suggest the presence of some air trapping. No consolidative airspace disease. No pleural effusions. No suspicious appearing pulmonary nodules or masses are noted. Upper Abdomen: Mild diffuse low attenuation throughout the visualized hepatic parenchyma, indicative of hepatic steatosis. Musculoskeletal: There are no aggressive appearing lytic or blastic lesions noted in the visualized portions of the skeleton. Review of the MIP images confirms the above findings. IMPRESSION: 1. No evidence of pulmonary embolism. 2. Probable air trapping in the lungs. 3. Aortic atherosclerosis, in addition to left main and 2 vessel coronary artery disease. Please note that although the presence of coronary artery calcium documents the presence of coronary artery disease, the severity of this disease and any potential stenosis cannot be assessed on this non-gated CT examination. Assessment for potential risk factor modification, dietary therapy or pharmacologic therapy may be warranted, if clinically indicated. 4. There are calcifications of the aortic valve. Echocardiographic correlation for evaluation of potential valvular dysfunction may be warranted if clinically indicated. 5. Hepatic steatosis. Aortic Atherosclerosis (ICD10-I70.0). Electronically Signed   By: Trudie Reed M.D.   On: 07/03/2018 19:00   US Renal  Result Date: 07/03/2018 CLINICAL DATA:  Acute kidney injury EXAM: RENAL / URINARY TRACT ULTRASOUND COMPLETE COMPARISON:  CT abdomen pelvis 06/22/2018 FINDINGS: Right Kidney: Renal measurements: 11.0 x 5.9 x 5.1 cm = volume: 170 mL mL . Echogenicity within normal limits. No mass or hydronephrosis visualized.  Left Kidney: Renal measurements: 12.4 x 5.1 x 5.6 cm = volume: 182 mL. Echogenicity within normal limits. No mass or hydronephrosis visualized. Lower pole cyst measures 3.2 x 3.4 x 3.7 cm Bladder: Appears normal for degree of bladder distention. IMPRESSION: No hydronephrosis or other finding that would contribute to acute kidney injury. Electronically Signed   By: Deatra Robinson M.D.   On: 07/03/2018 22:08   Dg Chest Portable 1 View  Result Date: 07/03/2018 CLINICAL DATA:  Central chest pain and tightness which shortness-of-breath. EXAM: PORTABLE CHEST 1 VIEW COMPARISON:  10/22/2015 FINDINGS: Lungs are somewhat hypoinflated without focal airspace consolidation or effusion. Cardiomediastinal silhouette and remainder the  exam is unchanged. IMPRESSION: No active disease. Electronically Signed   By: Elberta Fortisaniel  Boyle M.D.   On: 07/03/2018 17:29    Cardiac Studies    Widely patent left main.  Calcification is noted on cine fluoroscopy.  Luminal irregularities in the LAD proximal to mid.  Calcification is noted in the proximal vessel on cine fluoroscopy.  Mid to distal LAD up to 40 % generalized narrowing.  The third diagonal contains eccentric 60 to 70% ostial narrowing.  Circumflex gives origin to 3 obtuse marginal branches.  The larger branch, OM 3 contains 30% generalized proximal to mid narrowing.  Right coronary artery contains luminal irregularities up to 30% proximal to distal.  Upper normal LVEDP at 18 mmHg.  RECOMMENDATIONS:   Angina pectoris/presumed ischemia without obstructive coronary artery disease (INOCA).  Aggressive secondary risk modification.    Nitroglycerin use for recurrent anginal complaints.  Consider addition of beta-blocker/ARB therapy to enhance CV risk reduction.  Close clinical follow-up.     Patient Profile     68 y.o. male presented with fatigue, SOB   Assessment & Plan     1   Cardiac   Cath yesterday showed moderate CAD   NO critical lesions noted    Plan for medical Rx Pt has been started on a statin  Wll need to be followed long term for response  DIscussed walking and diet, wt loss  Dietrich PatesPaula [place]  For questions or updates, please contact [facility] Please consult [email or web address] for contact info under        Signed, Dietrich PatesPaula Khristi Schiller, MD  07/05/2018, 8:51 AM

## 2018-07-05 NOTE — Discharge Instructions (Signed)
Scott Young, Ronald, Scott Young in 5-7 days ° °Please get a complete blood count and chemistry panel checked by your Primary Scott Young at your next visit, and again as instructed by your Primary Scott Young. Please get your medications reviewed and adjusted by your Primary Scott Young. ° °Please request your Primary Scott Young to go over all Hospital Tests and Procedure/Radiological results at the [facility] up, please get all Hospital records sent to your Prim Scott Young by signing hospital release before you go home. ° °If you had Pneumonia of Lung problems at the Hospital: °Please get a 2 view Chest X ray done in 6-8 weeks after hospital discharge or sooner if instructed by your Primary Scott Young. ° °If you have Congestive Heart Failure: °Please call your Cardiologist or Primary Scott Young anytime you have any of the following symptoms:  °1) 3 pound weight gain in 24 hours or 5 pounds in 1 week  °2) shortness of breath, with or without a dry hacking cough  °3) swelling in the hands, feet or stomach  °4) if you have to sleep on extra pillows at night in order to breathe ° °Scott cardiac low salt diet and 1.5 lit/day fluid restriction. ° °If you have diabetes °Accuchecks 4 times/day, Once in AM empty stomach and then before each meal. °Log in all results and show them to your primary doctor at your next visit. °If any glucose reading is under 80 or above 300 call your primary Scott Young immediately. ° °If you have Seizure/Convulsions/Epilepsy: °Please do not drive, operate heavy machinery, participate in activities at heights or participate in high speed sports until you have seen by Primary Scott Young or a Neurologist and advised to do so again. ° °If you had Gastrointestinal Bleeding: °Please ask your Primary Scott Young to check a complete blood count within one week of discharge or at your next visit. Your endoscopic/colonoscopic biopsies that are pending at the time of discharge, will also need to followed by your Primary Scott Young. ° °Get Medicines reviewed and adjusted. °Please take all your  medications with you for your next visit with your Primary Scott Young ° °Please request your Primary Scott Young to go over all hospital tests and procedure/radiological results at the Scott up, please ask your Primary Scott Young to get all Hospital records sent to his/her office. ° °If you experience worsening of your admission symptoms, develop shortness of breath, life threatening emergency, suicidal or homicidal thoughts you must seek medical attention immediately by calling 911 or calling your Scott Young immediately  if symptoms less severe. ° °You must read complete instructions/literature along with all the possible adverse reactions/side effects for all the Medicines you take and that have been prescribed to you. Take any new Medicines after you have completely understood and accpet all the possible adverse reactions/side effects.  ° °Do not drive or operate heavy machinery when taking Pain medications.  ° °Do not take more than prescribed Pain, Sleep and Anxiety Medications ° °Special Instructions: If you have smoked or chewed Tobacco  in the last 2 yrs please stop smoking, stop any regular Alcohol  and or any Recreational drug use. ° °Wear Seat belts while driving. ° °Please note °You were cared for by a hospitalist during your hospital stay. If you have any questions about your discharge medications or the care you received while you were in the hospital after you are discharged, you can call the unit and asked to speak with the hospitalist on call if the hospitalist that took care of you is not available. Once   you are discharged, your primary care physician will handle any further medical issues. Please note that NO REFILLS for any discharge medications will be authorized once you are discharged, as it is imperative that you return to your primary care physician (or establish a relationship with a primary care physician if you do not have one) for your aftercare needs so that they can reassess your need for medications and monitor your  lab values. ° °You can reach the hospitalist office at phone [phone] or fax [phone] °  °If you do not have a primary care physician, you can call [phone] for a physician referral. ° °Activity: As tolerated with Full fall precautions use walker/cane & assistance as needed ° °Diet: heart healthy ° °Disposition Home  ° ° °

## 2018-07-17 ENCOUNTER — Encounter: Payer: Self-pay | Admitting: Physician Assistant

## 2018-07-17 NOTE — Progress Notes (Signed)
Cardiology Office Note   Date:  07/18/2018   ID:  Scott Young, DOB 08/22/1950, MRN [id number]  PCP:  Renford DillsPolite, Ronald, MD  Cardiologist: Dr. Katrinka BlazingSmith   Chief Complaint  Patient presents with  . Hospitalization Follow-up  . Hypertension  . Hyperlipidemia  . Coronary Artery Disease   History of Present Illness: Scott BucyKenneth Young is a 68 y.o. male who presents for post hospital follow-up, seen for Dr. Katrinka BlazingSmith.  Scott Young has a history of hypertension, hyperlipidemia and sleep apnea on CPAP.  He had normal coronaries on cardiac catheterization in 2007.  He last saw Dr. Katrinka BlazingSmith 11/2015 for evaluation of chest pressure and dyspnea.  An exercise tolerance test was ordered at that time and was negative however was considered negative however considered nondiagnostic as patient did not reach age-predicted maximum heart rate.  An echocardiogram was also performed which showed an LVEF of 55 to 60% with grade 1 diastolic dysfunction with no regional wall motion abnormalities.  On last hospital presentation, he had a 4 to 5-week period of fatigue and weakness with exertional substernal chest tightness/pressure with associated palpitations, nausea and sweating. He initially presented to an urgent care center and was transported to the emergency department via EMS.  At that time his chest pressure improved after 3 doses of sublingual nitroglycerin.  In the ED, his EKG was nonacute. He had negative troponins x3. Chest CTA with no evidence of PE however did note aortic atherosclerosis in addition to left main and two-vessel CAD.  His creatinine on presentation was 1.52.   Patient was noted to have a strong family history of questionable clotting disorder with mother having multiple PEs and died in her 7850s.  Maternal grandfather with blood clot and died of a massive heart attack in his 3750s.  Maternal uncle died from a blood clot in his heart in his 7240s.  Given the above, he underwent a cardiac cath on 07/04/2018 which  showed luminal irregularities of the LAD proximal to mid, mild to distal LAD stenosis of 40% with generalized narrowing, and third diagonal contains 60 to 70% ostial narrowing.Dr. Katrinka BlazingSmith who performed a cardiac cath recommended aggressive medical management. He was started on Plavix,aspirin, and statin. On hospital discharge 07/05/2018 patient's creatinine was 1.23 which appears to be at his remote baseline.  LDL was elevated at 121.  Per cath note, recommendations were for possible nitrates use if recurrent anginal complaints with consideration for the addition of beta-blocker/ARB therapy to enhance CV risk reduction. He was started on losartan during hospitalization  Today he presents and is feeling well. He has begun incorporating more physical activity including starting a rowing exercise regimen.  Denies recurrent exertional chest pain since hospitalization. Cath site without s/s of hematoma. He reports medication compliance. Has been following a low-fat, low sugar diet and has lost 12 pounds since hospital discharge.  He was seen by his primary care doctor last week for follow-up labs and exam and reported that his kidney function was back to baseline.  Past Medical History:  Diagnosis Date  . Depression   . H/O seasonal allergies    tx. OTC meds  . Hyperlipidemia   . Hypertension   . Prostatitis   . Sleep apnea    cpap  . Umbilical hernia    resolved -s/p surgical repair    Past Surgical History:  Procedure Laterality Date  . APPENDECTOMY    . CATARACT EXTRACTION Left    30 yrs ago  . COLONOSCOPY  multiple- x1 colon polyp removed  . COLONOSCOPY WITH PROPOFOL N/A 10/22/2014   Procedure: COLONOSCOPY WITH PROPOFOL;  Surgeon: Charolett Bumpers, MD;  Location: [facility]COPY;  Service: Endoscopy;  Laterality: N/A;  . EYE SURGERY     choriziom removed right eye 12'15  . HERNIA REPAIR     umbilical hernia repair  . LEFT HEART CATH AND CORONARY ANGIOGRAPHY N/A 07/04/2018   Procedure:  LEFT HEART CATH AND CORONARY ANGIOGRAPHY;  Surgeon: Lyn Records, MD;  Location: [facility]ASIVE [facility];  Service: Cardiovascular;  Laterality: N/A;  . TONSILLECTOMY      Current Outpatient Medications  Medication Sig Dispense Refill  . aspirin 81 MG tablet Take 81 mg by mouth at bedtime.     Marland Kitchen atorvastatin (LIPITOR) 40 MG tablet Take 1 tablet (40 mg total) by mouth daily at 6 PM. 30 tablet 1  . clopidogrel (PLAVIX) 75 MG tablet Take 1 tablet (75 mg total) by mouth daily with breakfast. 30 tablet 2  . fexofenadine (ALLEGRA) 180 MG tablet Take 180 mg by mouth daily.    Marland Kitchen FLUoxetine (PROZAC) 20 MG capsule Take 60 mg by mouth daily.    . fluticasone (FLONASE) 50 MCG/ACT nasal spray Place 1 spray into both nostrils daily.    Marland Kitchen levofloxacin (LEVAQUIN) 500 MG tablet Take 1 tablet (500 mg total) by mouth daily. 22 tablet 0  . losartan (COZAAR) 25 MG tablet Take 25 mg by mouth daily.    . meloxicam (MOBIC) 15 MG tablet Take 15 mg by mouth daily as needed for pain.     . metFORMIN (GLUCOPHAGE) 1000 MG tablet Take 1,000 mg by mouth daily with breakfast.    . naproxen (NAPROSYN) 500 MG tablet Take 500 mg by mouth 2 (two) times daily as needed (for pain).     . nitroGLYCERIN (NITROSTAT) 0.4 MG SL tablet Place 1 tablet (0.4 mg total) under the tongue every 5 (five) minutes as needed for chest pain. 30 tablet 1  . triamcinolone cream (KENALOG) 0.1 % Apply 1 application topically daily as needed (for irritation).     . metoprolol succinate (TOPROL XL) 25 MG 24 hr tablet Take 1 tablet (25 mg total) by mouth daily. 9 tablet 3   No current facility-administered medications for this visit.     Allergies:   Codeine   Social History:  The patient  reports that he has quit smoking. He quit smokeless tobacco use about 47 years ago. He reports that he does not drink alcohol or use drugs.   Family History:  The patient's family history includes Aneurysm in his brother and father; Cancer in his father; Heart disease  in his mother.   ROS:  Please see the history of present illness. Otherwise, review of systems are positive for none.   All other systems are reviewed and negative.    PHYSICAL EXAM: VS:  BP 132/86   Pulse 77   Ht 5' 11.25" (1.81 m)   Wt 280 lb 12.8 oz (127.4 kg)   BMI 38.89 kg/m  , BMI Body mass index is 38.89 kg/m.   General: Overweight,  NAD Skin: Warm, dry, intact  Head: Normocephalic, atraumatic, clear, moist mucus membranes. Neck: Negative for carotid bruits. No JVD Lungs:Clear to ausculation bilaterally. No wheezes, rales, or rhonchi. Breathing is unlabored. Cardiovascular: RRR with S1 S2. No murmurs, rubs, gallops, or LV heave appreciated. Abdomen: Soft, non-tender, non-distended with normoactive bowel sounds. No obvious abdominal masses. MSK: Strength and tone appear normal for  age. 5/5 in all extremities Extremities: No edema. No clubbing or cyanosis. DP/PT pulses 2+ bilaterally Neuro: Alert and oriented. No focal deficits. No facial asymmetry. MAE spontaneously. Psych: Responds to questions appropriately with normal affect.    EKG:  EKG is ordered today. The ekg ordered today demonstrates NSR  Recent Labs: 07/03/2018: ALT 24; B Natriuretic Peptide 31.3 07/05/2018: BUN 15; Creatinine, Ser 1.23; Hemoglobin 13.1; Platelets 169; Potassium 4.0; Sodium 137   Lipid Panel    Component Value Date/Time   CHOL 187 07/04/2018 0156   TRIG 97 07/04/2018 0156   HDL 47 07/04/2018 0156   CHOLHDL 4.0 07/04/2018 0156   VLDL 19 07/04/2018 0156   LDLCALC 121 (H) 07/04/2018 0156    Wt Readings from Last 3 Encounters:  07/18/18 280 lb 12.8 oz (127.4 kg)  07/05/18 279 lb 3.2 oz (126.6 kg)  11/13/15 (!) 304 lb 6.4 oz (138.1 kg)     Other studies Reviewed: Additional studies/ records that were reviewed today include:   Cath 07/04/2018:   Widely patent left main.  Calcification is noted on cine fluoroscopy.  Luminal irregularities in the LAD proximal to mid.  Calcification is  noted in the proximal vessel on cine fluoroscopy.  Mid to distal LAD up to 40 % generalized narrowing.  The third diagonal contains eccentric 60 to 70% ostial narrowing.  Circumflex gives origin to 3 obtuse marginal branches. The larger branch, OM 3 contains 30% generalized proximal to mid narrowing.  Right coronary artery contains luminal irregularities up to 30% proximal to distal.  Upper normal LVEDP at 18 mmHg.  RECOMMENDATIONS:   Angina pectoris/presumed ischemia without obstructive coronary artery disease (INOCA).  Aggressive secondary risk modification.    Nitroglycerin use for recurrent anginal complaints.  Consider addition of beta-blocker/ARB therapy to enhance CV risk reduction.  Close clinical follow-up.  Echocardiogram 07/04/2018: Study Conclusions  - Left ventricle: The cavity size was normal. There was mild   concentric hypertrophy. Systolic function was normal. The   estimated ejection fraction was in the range of 60% to 65%. Wall   motion was normal; there were no regional wall motion   abnormalities. Doppler parameters are consistent with abnormal   left ventricular relaxation (grade 1 diastolic dysfunction). The   E/e&' ratio is between 8-15,suggesting indeterminate LV filling   pressure. - Aorta: Ascending aortic diameter: 40 mm (S). - Ascending aorta: The ascending aorta was mildly dilated. - Mitral valve: Mildly thickened leaflets . There was trivial   regurgitation. - Left atrium: The atrium was normal in size. - Right atrium: The atrium was mildly dilated. - Tricuspid valve: There was trivial regurgitation. - Pulmonary arteries: PA peak pressure: 17 mm Hg (S). - Inferior vena cava: The vessel was dilated. The respirophasic   diameter changes were blunted (< 50%), consistent with elevated   central venous pressure.  Impressions:  - Compared to a prior study in 2017, the LVEF is unchanged. The   ascending aorta is dilated to 4.0  cm.   Exercise Tolerance Test 12/03/2015:  Blood pressure demonstrated a hypertensive response to exercise.  There was no ST segment deviation noted during stress.  Negative but non-diagnostic ETT. Patient only achieved 79% of age predicted maximum HR. _______________  Echocardiogram 12/03/2015: Study Conclusions: - Left ventricle: The cavity size was normal. There was mild concentric hypertrophy. Systolic function was normal. The estimated ejection fraction was in the range of 55% to 60%. Wall motion was normal; there were no regional wall motion abnormalities. Doppler parameters  are consistent with abnormal left ventricular relaxation (grade 1 diastolic dysfunction). Doppler parameters are consistent with elevated ventricular end-diastolic filling pressure. - Aortic valve: Transvalvular velocity was within the normal range. There was no stenosis. - Mitral valve: Transvalvular velocity was within the normal range. There was no evidence for stenosis. There was trivial regurgitation. - Right ventricle: The cavity size was mildly dilated. Wall thickness was normal. Systolic function was normal. - Tricuspid valve: There was no regurgitation. _______________  Cardiac Catheterization 12/13/2005: Results: 1. Hemodynamic data: 1. Aortic pressure 121/72. 2. Left ventricular pressure 114/17. 2. Left ventriculography: LV cavity is faintly opacified. Overall function is felt to be normal. EF 60%. No regional wall motion abnormality. 3. Coronary angiography. 1. Left main coronary: Widely patent. 2. Left anterior descending coronary: The LAD is widely patent, gives origin to three diagonal branches. All are patent. 3. Circumflex artery: The circumflex is a moderate size vessel that gives origin to three obtuse marginals. Luminal irregularities are noted in the mid circumflex. The  vessel is widely patent. 4. Right coronary: The right coronary artery is dominant and is normal. Gives origin to PDA and two small left ventricular branches.  Conclusions: 1. Essentially normal coronary arteries. 2. Normal left ventricular function.  Plan: Monitor groin. Home later today if good hemostasis. Consider GI workup for chest discomfort. No further cardiac evaluation.   ASSESSMENT AND PLAN:  1.  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 HTN: -Stable, 132/86 -Continue losartan -Will add low-dose Toprol  3. HLD: -LDL elevated at 121 on 07/04/2018 with a goal of 70mg /dL -Continue statin and recheck lipids in 8 weeks with LFTs -Has reported history of mild lesions with statins however states he has not had recurrent symptoms as of yet.  Discussed Repatha option if return of symptoms in the future.  4.  Dilated Ascending aorta: -Per echocardiogram, compared to prior study in 2017 LVEF unchanged with ascending aorta dilated to 40mm  -Noted to be 34 mm on echocardiogram from 12/03/2015  -Will need to annual serial echocardiogram for close monitoring -Patient with family history of aortic dissections   Current medicines are reviewed at length with the patient today.  The patient does not have concerns regarding medicines.  The following changes have been made: Add Toprol-XL 25 mg daily  Labs/ tests ordered today include: None  Orders Placed This Encounter  Procedures  . EKG 12-Lead    Disposition:   FU  with Dr. Katrinka BlazingSmith in 3 months   Signed, Georgie ChardJill McDaniel, NP  07/18/2018 3:07 PM    [facility] [address], [place], [place]  [address] Phone: [phone]; Fax: [phone]

## 2018-07-18 ENCOUNTER — Encounter: Payer: Self-pay | Admitting: Physician Assistant

## 2018-07-18 ENCOUNTER — Ambulatory Visit (INDEPENDENT_AMBULATORY_CARE_PROVIDER_SITE_OTHER): Payer: Medicare Other | Admitting: Cardiology

## 2018-07-18 VITALS — BP 132/86 | HR 77 | Ht 71.25 in | Wt 280.8 lb

## 2018-07-18 DIAGNOSIS — E7841 Elevated Lipoprotein(a): Secondary | ICD-10-CM

## 2018-07-18 DIAGNOSIS — I10 Essential (primary) hypertension: Secondary | ICD-10-CM | POA: Diagnosis not present

## 2018-07-18 DIAGNOSIS — F419 Anxiety disorder, unspecified: Secondary | ICD-10-CM | POA: Diagnosis not present

## 2018-07-18 DIAGNOSIS — I77810 Thoracic aortic ectasia: Secondary | ICD-10-CM

## 2018-07-18 DIAGNOSIS — I25118 Atherosclerotic heart disease of native coronary artery with other forms of angina pectoris: Secondary | ICD-10-CM | POA: Diagnosis not present

## 2018-07-18 DIAGNOSIS — E1169 Type 2 diabetes mellitus with other specified complication: Secondary | ICD-10-CM | POA: Diagnosis not present

## 2018-07-18 DIAGNOSIS — I209 Angina pectoris, unspecified: Secondary | ICD-10-CM | POA: Diagnosis not present

## 2018-07-18 DIAGNOSIS — E7800 Pure hypercholesterolemia, unspecified: Secondary | ICD-10-CM | POA: Diagnosis not present

## 2018-07-18 DIAGNOSIS — N179 Acute kidney failure, unspecified: Secondary | ICD-10-CM | POA: Diagnosis not present

## 2018-07-18 MED ORDER — METOPROLOL SUCCINATE ER 25 MG PO TB24: 25.0000 mg | ORAL_TABLET | Freq: Every day | ORAL | 3 refills | Status: DC

## 2018-07-18 NOTE — Patient Instructions (Signed)
Medication Instructions:  Your physician has recommended you make the following change in your medication:   1. START TOPROL XL 25 MG DAILY.  If you need a refill on your cardiac medications before your next appointment, please call your pharmacy.   Lab work: NONE  If you have labs (blood work) drawn today and your tests are completely normal, you will receive your results only by: Marland Kitchen MyChart Message (if you have MyChart) OR . A paper copy in the mail If you have any lab test that is abnormal or we need to change your treatment, we will call you to review the results.  Testing/Procedures: NONE  Follow-Up: At [facility], you and your health needs are our priority.  As part of our continuing mission to provide you with exceptional heart care, we have created designated Provider Care Teams.  These Care Teams include your primary Cardiologist (physician) and Advanced Practice Providers (APPs -  Physician Assistants and Nurse Practitioners) who all work together to provide you with the care you need, when you need it. . You will need a follow up appointment in:  3 months.    You may see Lesleigh Noe, MD or one of the following Advanced Practice Providers on your designated Care Team: . Tereso Newcomer, PA-C . Vin Bhagat, PA-C . Berton Bon, NP  Any Other Special Instructions Will Be Listed Below (If Applicable).  ,

## 2018-07-19 ENCOUNTER — Telehealth: Payer: Self-pay | Admitting: Cardiology

## 2018-07-19 NOTE — Telephone Encounter (Signed)
Pt was seen in the office and metoprolol 25 mg tablet taken daily was prescribed, but only 9 tablets were dispensed. Does pt supposed to take this medication daily, if so can we dispense 30 day supply with refills? Please address

## 2018-07-20 MED ORDER — METOPROLOL SUCCINATE ER 25 MG PO TB24: 25.0000 mg | ORAL_TABLET | Freq: Every day | ORAL | 3 refills | Status: DC

## 2018-07-20 NOTE — Telephone Encounter (Signed)
Took to [employer], CMA.  She covered Lorin Picket / Noreene Larsson on the day of the office visit and she corrected it for the pt.

## 2018-07-20 NOTE — Addendum Note (Signed)
Addended by: Michaelle Copas on: 07/20/2018 07:31 AM   Modules accepted: Orders

## 2018-07-21 DIAGNOSIS — M9903 Segmental and somatic dysfunction of lumbar region: Secondary | ICD-10-CM | POA: Diagnosis not present

## 2018-07-21 DIAGNOSIS — M545 Low back pain: Secondary | ICD-10-CM | POA: Diagnosis not present

## 2018-07-31 ENCOUNTER — Telehealth: Payer: Self-pay | Admitting: Cardiology

## 2018-07-31 MED ORDER — EZETIMIBE 10 MG PO TABS: 10.0000 mg | ORAL_TABLET | Freq: Every day | ORAL | 11 refills | Status: DC

## 2018-07-31 NOTE — Telephone Encounter (Signed)
Pt c/o medication issue:  1. Name of Medication: Lipitor  2. How are you currently taking this medication (dosage and times per day)?  Once a day  3. Are you having a reaction (difficulty breathing--STAT)? no  4. What is your medication issue? Muscle pain in his joints- had this before when he took the statin before

## 2018-07-31 NOTE — Telephone Encounter (Signed)
See MY CHART message from today routed to [facility], Pharm-D

## 2018-07-31 NOTE — Telephone Encounter (Signed)
Spoke with Georgie Chard.  Per Noreene Larsson, instructed patient to STOP LIPITOR and START ZETIA 10 mg daily as he states he has tried Crestor before and was intolerant. He will come to his Dr. Katrinka Blazing appointment in May fasting for repeat labs. He was grateful for call and agrees with treatment plan.

## 2018-08-07 ENCOUNTER — Other Ambulatory Visit: Payer: Self-pay | Admitting: Psychiatry

## 2018-08-08 NOTE — Telephone Encounter (Signed)
Need to review paper chart  

## 2018-08-09 ENCOUNTER — Other Ambulatory Visit: Payer: Self-pay | Admitting: Interventional Cardiology

## 2018-08-09 MED ORDER — CLOPIDOGREL BISULFATE 75 MG PO TABS: 75.0000 mg | ORAL_TABLET | Freq: Every day | ORAL | 3 refills | Status: DC

## 2018-08-09 MED ORDER — METOPROLOL SUCCINATE ER 25 MG PO TB24: 25.0000 mg | ORAL_TABLET | Freq: Every day | ORAL | 3 refills | Status: DC

## 2018-09-05 ENCOUNTER — Ambulatory Visit: Payer: Self-pay | Admitting: Psychiatry

## 2018-09-20 DIAGNOSIS — I25118 Atherosclerotic heart disease of native coronary artery with other forms of angina pectoris: Secondary | ICD-10-CM | POA: Diagnosis not present

## 2018-09-20 DIAGNOSIS — I10 Essential (primary) hypertension: Secondary | ICD-10-CM | POA: Diagnosis not present

## 2018-09-20 DIAGNOSIS — E7800 Pure hypercholesterolemia, unspecified: Secondary | ICD-10-CM | POA: Diagnosis not present

## 2018-09-20 DIAGNOSIS — E1169 Type 2 diabetes mellitus with other specified complication: Secondary | ICD-10-CM | POA: Diagnosis not present

## 2018-09-21 DIAGNOSIS — I25118 Atherosclerotic heart disease of native coronary artery with other forms of angina pectoris: Secondary | ICD-10-CM | POA: Diagnosis not present

## 2018-09-21 DIAGNOSIS — I10 Essential (primary) hypertension: Secondary | ICD-10-CM | POA: Diagnosis not present

## 2018-09-21 DIAGNOSIS — E7800 Pure hypercholesterolemia, unspecified: Secondary | ICD-10-CM | POA: Diagnosis not present

## 2018-09-21 DIAGNOSIS — E1169 Type 2 diabetes mellitus with other specified complication: Secondary | ICD-10-CM | POA: Diagnosis not present

## 2018-09-27 ENCOUNTER — Ambulatory Visit: Payer: Self-pay | Admitting: Psychiatry

## 2018-10-09 ENCOUNTER — Ambulatory Visit: Payer: Self-pay | Admitting: Psychiatry

## 2018-10-13 ENCOUNTER — Telehealth: Payer: Self-pay | Admitting: Family Medicine

## 2018-10-13 NOTE — Telephone Encounter (Signed)
Tried to call patient regarding his appt for 10/19/18 , need to obtain consent from patient to switch to virtual visit, need to change time to 1030a   Will try patient again , if pt calls back please put through to cell [phone]

## 2018-10-16 NOTE — Telephone Encounter (Signed)
Patient set up for MyChart? Yes   Is patient using Smartphone/computer/tablet? smartphone  Did audio/video work?  Does patient need telephone visit no   Best phone number to use? [phone]    Virtual Visit Pre-Appointment Phone Call  "(Name), I am calling you today to discuss your upcoming appointment. We are currently trying to limit exposure to the virus that causes COVID-19 by seeing patients at home rather than in the office."  1. "What is the BEST phone number to call the day of the visit?" - include this in appointment notes  2. Do you have or have access to (through a family member/friend) a smartphone with video capability that we can use for your visit?" a. If yes - list this number in appt notes as cell (if different from BEST phone #) and list the appointment type as a VIDEO visit in appointment notes b. If no - list the appointment type as a PHONE visit in appointment notes  3. Confirm consent - "In the setting of the current Covid19 crisis, you are scheduled for a (phone or video) visit with your provider on (date) at (time).  Just as we do with many in-office visits, in order for you to participate in this visit, we must obtain consent.  If you'd like, I can send this to your mychart (if signed up) or email for you to review.  Otherwise, I can obtain your verbal consent now.  All virtual visits are billed to your insurance company just like a normal visit would be.  By agreeing to a virtual visit, we'd like you to understand that the technology does not allow for your provider to perform an examination, and thus may limit your provider's ability to fully assess your condition. If your provider identifies any concerns that need to be evaluated in person, we will make arrangements to do so.  Finally, though the technology is pretty good, we cannot assure that it will always work on either your or our end, and in the setting of a video visit, we may have to convert it to a  phone-only visit.  In either situation, we cannot ensure that we have a secure connection.  Are you willing to proceed?" STAFF: Did the patient verbally acknowledge consent to telehealth visit? Document YES/NO here: YES  4. Advise patient to be prepared - "Two hours prior to your appointment, go ahead and check your blood pressure, pulse, oxygen saturation, and your weight (if you have the equipment to check those) and write them all down. When your visit starts, your provider will ask you for this information. If you have an Apple Watch or Kardia device, please plan to have heart rate information ready on the day of your appointment. Please have a pen and paper handy nearby the day of the visit as well."  5. Give patient instructions for MyChart download to smartphone OR Doximity/Doxy.me as below if video visit (depending on what platform provider is using)  6. Inform patient they will receive a phone call 15 minutes prior to their appointment time (may be from unknown caller ID) so they should be prepared to answer    TELEPHONE CALL NOTE  Glenda Mcloone has been deemed a candidate for a follow-up tele-health visit to limit community exposure during the Covid-19 pandemic. I spoke with the patient via phone to ensure availability of phone/video source, confirm preferred email & phone number, and discuss instructions and expectations.  I reminded Scott Young to be prepared with any vital  sign and/or heart rhythm information that could potentially be obtained via home monitoring, at the time of his visit. I reminded Gilliam Hawkes to expect a phone call prior to his visit.  Berle Mull 10/16/2018 2:37 PM   INSTRUCTIONS FOR DOWNLOADING THE MYCHART APP TO SMARTPHONE  - The patient must first make sure to have activated MyChart and know their login information - If Apple, go to [employer] and type in MyChart in the search bar and download the app. If Android, ask patient to go to [employer]  and type in [place] in the search bar and download the app. The app is free but as with any other app downloads, their phone may require them to verify saved payment information or Apple/Android password.  - The patient will need to then log into the app with their MyChart username and password, and select [facility] as their healthcare provider to link the account. When it is time for your visit, go to the MyChart app, find appointments, and click Begin Video Visit. Be sure to Select Allow for your device to access the Microphone and Camera for your visit. You will then be connected, and your provider will be with you shortly.  **If they have any issues connecting, or need assistance please contact MyChart service desk [phone])**  **If using a computer, in order to ensure the best quality for their visit they will need to use either of the following Internet Browsers: [employer] or Google Chrome**  IF USING DOXIMITY or DOXY.ME - The patient will receive a link just prior to their visit by text.     FULL LENGTH CONSENT FOR TELE-HEALTH VISIT   I hereby voluntarily request, consent and authorize [facility] and its employed or contracted physicians, physician assistants, nurse practitioners or other licensed health care professionals (the Practitioner), to provide me with telemedicine health care services (the Services") as deemed necessary by the treating Practitioner. I acknowledge and consent to receive the Services by the Practitioner via telemedicine. I understand that the telemedicine visit will involve communicating with the Practitioner through live audiovisual communication technology and the disclosure of certain medical information by electronic transmission. I acknowledge that I have been given the opportunity to request an in-person assessment or other available alternative prior to the telemedicine visit and am voluntarily participating in the telemedicine  visit.  I understand that I have the right to withhold or withdraw my consent to the use of telemedicine in the course of my care at any time, without affecting my right to future care or treatment, and that the Practitioner or I may terminate the telemedicine visit at any time. I understand that I have the right to inspect all information obtained and/or recorded in the course of the telemedicine visit and may receive copies of available information for a reasonable fee.  I understand that some of the potential risks of receiving the Services via telemedicine include:   Delay or interruption in medical evaluation due to technological equipment failure or disruption;  Information transmitted may not be sufficient (e.g. poor resolution of images) to allow for appropriate medical decision making by the Practitioner; and/or   In rare instances, security protocols could fail, causing a breach of personal health information.  Furthermore, I acknowledge that it is my responsibility to provide information about my medical history, conditions and care that is complete and accurate to the best of my ability. I acknowledge that Practitioner's advice, recommendations, and/or decision may be  based on factors not within their control, such as incomplete or inaccurate data provided by me or distortions of diagnostic images or specimens that may result from electronic transmissions. I understand that the practice of medicine is not an exact science and that Practitioner makes no warranties or guarantees regarding treatment outcomes. I acknowledge that I will receive a copy of this consent concurrently upon execution via email to the email address I last provided but may also request a printed copy by calling the office of [facility].    I understand that my insurance will be billed for this visit.   I have read or had this consent read to me.  I understand the contents of this consent, which adequately explains  the benefits and risks of the Services being provided via telemedicine.   I have been provided ample opportunity to ask questions regarding this consent and the Services and have had my questions answered to my satisfaction.  I give my informed consent for the services to be provided through the use of telemedicine in my medical care  By participating in this telemedicine visit I agree to the above.

## 2018-10-18 NOTE — Progress Notes (Signed)
Virtual Visit via Video Note   This visit type was conducted due to national recommendations for restrictions regarding the COVID-19 Pandemic (e.g. social distancing) in an effort to limit this patient's exposure and mitigate transmission in our community.  Due to his co-morbid illnesses, this patient is at least at moderate risk for complications without adequate follow up.  This format is felt to be most appropriate for this patient at this time.  All issues noted in this document were discussed and addressed.  A limited physical exam was performed with this format.  Please refer to the patient's chart for his consent to telehealth [facility].   Date:  10/19/2018   ID:  Scott BucyKenneth Young, DOB [birth year], MRN [id number]  Patient Location: Home Provider Location: Office  PCP:  Renford DillsPolite, Ronald, MD  Cardiologist:  Lesleigh NoeHenry W Neeley Sedivy III, MD  Electrophysiologist:  None   Evaluation Performed:  Follow-Up Visit  Chief Complaint:  CAD / CHF  History of Present Illness:    Scott Young is a 68 y.o. male with  hypertension, hyperlipidemia and sleep apnea, recurring CP, non obstructive coronaries on cardiac catheterization in 07/04/2018.  Scott Young was having chest pain earlier this year.  He ultimately underwent coronary angiography in January 2020.  Nonobstructive disease was noted.  He does need secondary risk prevention.  Recent laboratory data includes hemoglobin A1c 6.3, LDL 86, triglycerides 156.  Scott Young is doing well.  No medication side effects.  Long discussion concerning secondary risk factor modification.  No medication side effects at this point.  No significant episodes of chest pain since discharge from the hospital.  CT scan done during the hospitalization demonstrated rather diffuse aortic atherosclerosis.   The patient does not have symptoms concerning for COVID-19 infection (fever, chills, cough, or new shortness of breath).    Past Medical History:  Diagnosis Date  .  Depression   . H/O seasonal allergies    tx. OTC meds  . Hyperlipidemia   . Hypertension   . Prostatitis   . Sleep apnea    cpap  . Umbilical hernia    resolved -s/p surgical repair   Past Surgical History:  Procedure Laterality Date  . APPENDECTOMY    . CATARACT EXTRACTION Left    30 yrs ago  . COLONOSCOPY      multiple- x1 colon polyp removed  . COLONOSCOPY WITH PROPOFOL N/A 10/22/2014   Procedure: COLONOSCOPY WITH PROPOFOL;  Surgeon: Charolett BumpersMartin K Johnson, MD;  Location: [facility];  Service: Endoscopy;  Laterality: N/A;  . EYE SURGERY     choriziom removed right eye 12'15  . HERNIA REPAIR     umbilical hernia repair  . LEFT HEART CATH AND CORONARY ANGIOGRAPHY N/A 07/04/2018   Procedure: LEFT HEART CATH AND CORONARY ANGIOGRAPHY;  Surgeon: Lyn RecordsSmith, Ahtziry Saathoff W, MD;  Location: [facility]VAS[facility];  Service: Cardiovascular;  Laterality: N/A;  . TONSILLECTOMY       Current Meds  Medication Sig  . aspirin 81 MG tablet Take 81 mg by mouth at bedtime.   . clopidogrel (PLAVIX) 75 MG tablet Take 1 tablet (75 mg total) by mouth daily with breakfast.  . Coenzyme Q10 (COQ10 PO) Take 100 mg by mouth daily.  . cyanocobalamin 1000 MCG tablet Take 1,000 mcg by mouth daily.  Marland Kitchen. ezetimibe (ZETIA) 10 MG tablet Take 1 tablet (10 mg total) by mouth daily.  . fexofenadine (ALLEGRA) 180 MG tablet Take 180 mg by mouth daily.  Marland Kitchen. FLUoxetine (PROZAC) 20 MG capsule TAKE  3 CAPSULES EVERY DAY  . fluticasone (FLONASE) 50 MCG/ACT nasal spray Place 1 spray into both nostrils daily.  Marland Kitchen losartan (COZAAR) 25 MG tablet Take 25 mg by mouth daily.  . metFORMIN (GLUCOPHAGE) 1000 MG tablet Take 1,000 mg by mouth daily with breakfast.  . metoprolol succinate (TOPROL XL) 25 MG 24 hr tablet Take 1 tablet (25 mg total) by mouth daily.  . nitroGLYCERIN (NITROSTAT) 0.4 MG SL tablet Place 1 tablet (0.4 mg total) under the tongue every 5 (five) minutes as needed for chest pain.  Marland Kitchen pyridOXINE (VITAMIN B-6) 100 MG tablet Take 100 mg  by mouth daily.  . Selenium 200 MCG CAPS Take 1 capsule by mouth daily.  Marland Kitchen triamcinolone cream (KENALOG) 0.1 % Apply 1 application topically daily as needed (for irritation).      Allergies:   Codeine   Social History   Tobacco Use  . Smoking status: Former Games developer  . Smokeless tobacco: Former Neurosurgeon    Quit date: 09/23/1970  Substance Use Topics  . Alcohol use: No  . Drug use: No     Family Hx: The patient's family history includes Aneurysm in his brother and father; Cancer in his father; Heart disease in his mother.  ROS:   Please see the history of present illness.    He is concerned about the possibility of pancreatic cancer on metformin.  He is also having diarrhea several times a week which is troublesome to him All other systems reviewed and are negative.   Prior CV studies:   The following studies were reviewed today:  CORONARY ANGIOGRAPHY 06/2018: Diagnostic  Dominance: Right     Labs/Other Tests and Data Reviewed:    EKG:  No ECG reviewed.  Recent Labs: 07/03/2018: ALT 24; B Natriuretic Peptide 31.3 07/05/2018: BUN 15; Creatinine, Ser 1.23; Hemoglobin 13.1; Platelets 169; Potassium 4.0; Sodium 137   Recent Lipid Panel Lab Results  Component Value Date/Time   CHOL 187 07/04/2018 01:56 AM   TRIG 97 07/04/2018 01:56 AM   HDL 47 07/04/2018 01:56 AM   CHOLHDL 4.0 07/04/2018 01:56 AM   LDLCALC 121 (H) 07/04/2018 01:56 AM    Wt Readings from Last 3 Encounters:  10/19/18 265 lb (120.2 kg)  07/18/18 280 lb 12.8 oz (127.4 kg)  07/05/18 279 lb 3.2 oz (126.6 kg)     Objective:    Vital Signs:  BP 117/72   Pulse 63   Ht 5' 11.25" (1.81 m)   Wt 265 lb (120.2 kg)   BMI 36.70 kg/m    VITAL SIGNS:  reviewed GEN:  no acute distress  ASSESSMENT & PLAN:    1. Coronary artery disease of native artery of native heart with stable angina pectoris (HCC)   2. Elevated lipoprotein(a)   3. Essential hypertension   4. Aortic atherosclerosis (HCC)   5. 2019 novel  coronavirus disease (COVID-19)   6. Controlled type 2 diabetes mellitus with other circulatory complication, with long-term current use of insulin (HCC)    PLAN:  1. We discussed secondary prevention.  Please see the discussion below. 2. He was made aware that LDL is not quite at target.  Most recently in April it was 84.  Encouraged decrease saturated fat in diet and aerobic activity.  He is not on statin therapy.  We will inquire about why this is the case.  Will consider adding at least low-dose statin therapy if he is not intolerant.  Otherwise we may have to consider PCSK9 therapy, Bempedoic acid ,  or Inclisiran once it is available. 3. Blood pressure target 130/80 mmHg. 4. Management of aortic atherosclerosis is with secondary prevention as outlined below. 5. Goal hemoglobin A1c is less than 7.  He is currently on metformin.  He has diarrhea and concerns about possibly developing pancreatic cancer.  Perhaps he should be considered for S GLT 2 therapy.  Will raise this question with Dr. Nehemiah Settle.  Overall education and awareness concerning primary/secondary risk prevention was discussed in detail: LDL less than 70, hemoglobin A1c less than 7, blood pressure target less than 130/80 mmHg, >150 minutes of moderate aerobic activity per week, avoidance of smoking, weight control (via diet and exercise), and continued surveillance/management of/for obstructive sleep apnea.   COVID-19 Education: The signs and symptoms of COVID-19 were discussed with the patient and how to seek care for testing (follow up with PCP or arrange E-visit).  The importance of social distancing was discussed today.  Time:   Today, I have spent 18 minutes with the patient with telehealth technology discussing the above problems.     Medication Adjustments/Labs and Tests Ordered: Current medicines are reviewed at length with the patient today.  Concerns regarding medicines are outlined above.   Tests Ordered: No orders of  the defined types were placed in this encounter.   Medication Changes: No orders of the defined types were placed in this encounter.   Disposition:  Follow up in 1 year(s)  Signed, Lesleigh Noe, MD  10/19/2018 10:52 AM    [facility]

## 2018-10-19 ENCOUNTER — Encounter: Payer: Self-pay | Admitting: Interventional Cardiology

## 2018-10-19 ENCOUNTER — Telehealth (INDEPENDENT_AMBULATORY_CARE_PROVIDER_SITE_OTHER): Payer: Medicare Other | Admitting: Interventional Cardiology

## 2018-10-19 ENCOUNTER — Other Ambulatory Visit: Payer: Self-pay

## 2018-10-19 VITALS — BP 117/72 | HR 63 | Ht 71.25 in | Wt 265.0 lb

## 2018-10-19 DIAGNOSIS — Z794 Long term (current) use of insulin: Secondary | ICD-10-CM

## 2018-10-19 DIAGNOSIS — U071 COVID-19: Secondary | ICD-10-CM

## 2018-10-19 DIAGNOSIS — I10 Essential (primary) hypertension: Secondary | ICD-10-CM

## 2018-10-19 DIAGNOSIS — I25118 Atherosclerotic heart disease of native coronary artery with other forms of angina pectoris: Secondary | ICD-10-CM | POA: Diagnosis not present

## 2018-10-19 DIAGNOSIS — E1159 Type 2 diabetes mellitus with other circulatory complications: Secondary | ICD-10-CM

## 2018-10-19 DIAGNOSIS — I700 Atherosclerosis of aorta: Secondary | ICD-10-CM

## 2018-10-19 DIAGNOSIS — E7841 Elevated Lipoprotein(a): Secondary | ICD-10-CM

## 2018-10-19 NOTE — Patient Instructions (Signed)

## 2018-10-20 ENCOUNTER — Ambulatory Visit (INDEPENDENT_AMBULATORY_CARE_PROVIDER_SITE_OTHER): Payer: Medicare Other | Admitting: Psychiatry

## 2018-10-20 ENCOUNTER — Encounter: Payer: Self-pay | Admitting: Psychiatry

## 2018-10-20 ENCOUNTER — Other Ambulatory Visit: Payer: Self-pay

## 2018-10-20 VITALS — Wt 265.0 lb

## 2018-10-20 DIAGNOSIS — F329 Major depressive disorder, single episode, unspecified: Secondary | ICD-10-CM | POA: Diagnosis not present

## 2018-10-20 DIAGNOSIS — F32A Depression, unspecified: Secondary | ICD-10-CM

## 2018-10-20 MED ORDER — FLUOXETINE HCL 20 MG PO CAPS: ORAL_CAPSULE | ORAL | 3 refills | Status: DC

## 2018-10-20 NOTE — Progress Notes (Signed)
Scott BucyKenneth Young [id number] January 15, 1951 68 y.o.  Virtual Visit via Video Note  I connected with pt on 10/20/18 at 10:30 AM EDT by a video enabled telemedicine application and verified that I am speaking with the correct person using two identifiers.   I discussed the limitations of evaluation and management by telemedicine and the availability of in person appointments. The patient expressed understanding and agreed to proceed.  I discussed the assessment and treatment plan with the patient. The patient was provided an opportunity to ask questions and all were answered. The patient agreed with the plan and demonstrated an understanding of the instructions.   The patient was advised to call back or seek an in-person evaluation if the symptoms worsen or if the condition fails to improve as anticipated.  I provided 30 minutes of non-face-to-face time during this encounter.  The patient was located at home.  The provider was located at home.   Corie ChiquitoJessica Fisher Hargadon, PMHNP   Subjective:   Patient ID:  Scott Young is a 68 y.o. (DOB January 15, 1951) male.  Chief Complaint:  Chief Complaint  Patient presents with  . Follow-up    h/o Depression and anxiety    HPI Scott Young presents to the office today for follow-up of mood and anxiety. He reports, "I am probably the best I have done in years and feeling better than I have in years."  "I haven't had any bouts of depression. My medication seems to keep everything level." He reports minimal irritability. He reports some stress and anxiety and has been able to manage this with prayer and taking a moment to pause. "I'm dealing with it a lot better than I used to. He reports that he is now sleeping well. He reports that he had period of insomnia and PCP prescribed Xanax to help with sleep and sleep improved without having to take it. He reports that he has been walking more, usually a few times a week. Reports that his energy has improved and he has lost some  weight since hospitalization. He reports that his motivation has been good and waking up at 6:30 am. He reports that he is doing well with concentration and focus. Denies SI. "I'm enjoying my day to day."   He is currently doing everything by phone and computer. 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 Reports that they will have a drive up service this weekend. Has considered starting Zoom Bible studies.   Reports that he was hospitalized 07/03/18 and has a heart blockage and was started on new medications. Reports that  BP is staying below 120/80 and glucose levels have been stable   "I'm actually feeling physically the best I have felt in a long time."   Review of Systems:  Review of Systems  Genitourinary:       Reports having prostatitis and this has resolved.  Musculoskeletal: Negative for gait problem.  Neurological: Negative for tremors.  Psychiatric/Behavioral:       Please refer to HPI    Medications: I have reviewed the patient's current medications.  Current Outpatient Medications  Medication Sig Dispense Refill  . aspirin 81 MG tablet Take 81 mg by mouth at bedtime.     . clopidogrel (PLAVIX) 75 MG tablet Take 1 tablet (75 mg total) by mouth daily with breakfast. 90 tablet 3  . Coenzyme Q10 (COQ10 PO) Take 100 mg by mouth daily.    . cyanocobalamin 1000 MCG tablet Take 1,000 mcg by mouth daily.    .Marland Kitchen  fexofenadine (ALLEGRA) 180 MG tablet Take 180 mg by mouth daily.    Marland Kitchen FLUoxetine (PROZAC) 20 MG capsule TAKE 3 CAPSULES EVERY DAY 270 capsule 3  . fluticasone (FLONASE) 50 MCG/ACT nasal spray Place 1 spray into both nostrils daily.    Marland Kitchen losartan (COZAAR) 25 MG tablet Take 25 mg by mouth daily.    . metFORMIN (GLUCOPHAGE) 1000 MG tablet Take 1,000 mg by mouth daily with breakfast.    . metoprolol succinate (TOPROL XL) 25 MG 24 hr tablet Take 1 tablet (25 mg total) by mouth daily. 90 tablet 3  . Misc Natural Products (PROSTATE SUPPORT PO) Take by mouth.    .  pyridOXINE (VITAMIN B-6) 100 MG tablet Take 100 mg by mouth daily.    . Selenium 200 MCG CAPS Take 1 capsule by mouth daily.    Marland Kitchen ezetimibe (ZETIA) 10 MG tablet Take 1 tablet (10 mg total) by mouth daily. 90 tablet 3  . nitroGLYCERIN (NITROSTAT) 0.4 MG SL tablet Place 1 tablet (0.4 mg total) under the tongue every 5 (five) minutes as needed for chest pain. 30 tablet 1  . triamcinolone cream (KENALOG) 0.1 % Apply 1 application topically daily as needed (for irritation).      No current facility-administered medications for this visit.     Medication Side Effects: None  Allergies:  Allergies  Allergen Reactions  . Codeine Nausea And Vomiting    Past Medical History:  Diagnosis Date  . Depression   . H/O seasonal allergies    tx. OTC meds  . Hyperlipidemia   . Hypertension   . Prostatitis   . Sleep apnea    cpap  . Umbilical hernia    resolved -s/p surgical repair    Family History  Problem Relation Age of Onset  . Heart disease Mother   . Cancer Father        colon  . Aneurysm Father   . Aneurysm Brother     Social History   Socioeconomic History  . Marital status: Married    Spouse name: Not on file  . Number of children: Not on file  . Years of education: Not on file  . Highest education level: Not on file  Occupational History  . Not on file  Social Needs  . Financial resource strain: Not on file  . Food insecurity:    Worry: Not on file    Inability: Not on file  . Transportation needs:    Medical: Not on file    Non-medical: Not on file  Tobacco Use  . Smoking status: Former Games developer  . Smokeless tobacco: Former Neurosurgeon    Quit date: 09/23/1970  Substance and Sexual Activity  . Alcohol use: No  . Drug use: No  . Sexual activity: Not on file  Lifestyle  . Physical activity:    Days per week: Not on file    Minutes per session: Not on file  . Stress: Not on file  Relationships  . Social connections:    Talks on phone: Not on file    Gets together:  Not on file    Attends religious service: Not on file    Active member of club or organization: Not on file    Attends meetings of clubs or organizations: Not on file    Relationship status: Not on file  . Intimate partner violence:    Fear of current or ex partner: Not on file    Emotionally abused: Not on file  Physically abused: Not on file    Forced sexual activity: Not on file  Other Topics Concern  . Not on file  Social History Narrative  . Not on file    Past Medical History, Surgical history, Social history, and Family history were reviewed and updated as appropriate.   Please see review of systems for further details on the patient's review from today.   Objective:   Physical Exam:  Wt 265 lb (120.2 kg)   BMI 36.70 kg/m   Physical Exam Neurological:     Mental Status: He is alert and oriented to person, place, and time.     Cranial Nerves: No dysarthria.  Psychiatric:        Attention and Perception: Attention normal.        Mood and Affect: Mood normal.        Speech: Speech normal.        Behavior: Behavior is cooperative.        Thought Content: Thought content normal. Thought content is not paranoid or delusional. Thought content does not include homicidal or suicidal ideation. Thought content does not include homicidal or suicidal plan.        Cognition and Memory: Cognition and memory normal.        Judgment: Judgment normal.     Lab Review:     Component Value Date/Time   NA 137 07/05/2018 0525   K 4.0 07/05/2018 0525   CL 107 07/05/2018 0525   CO2 22 07/05/2018 0525   GLUCOSE 115 (H) 07/05/2018 0525   BUN 15 07/05/2018 0525   CREATININE 1.23 07/05/2018 0525   CREATININE 1.20 12/06/2011 1133   CALCIUM 8.6 (L) 07/05/2018 0525   PROT 6.9 07/03/2018 1649   ALBUMIN 4.0 07/03/2018 1649   AST 34 07/03/2018 1649   ALT 24 07/03/2018 1649   ALKPHOS 57 07/03/2018 1649   BILITOT 0.7 07/03/2018 1649   GFRNONAA 60 (L) 07/05/2018 0525   GFRAA >60  07/05/2018 0525       Component Value Date/Time   WBC 6.1 07/05/2018 0525   RBC 4.38 07/05/2018 0525   HGB 13.1 07/05/2018 0525   HCT 40.3 07/05/2018 0525   PLT 169 07/05/2018 0525   MCV 92.0 07/05/2018 0525   MCH 29.9 07/05/2018 0525   MCHC 32.5 07/05/2018 0525   RDW 13.0 07/05/2018 0525   LYMPHSABS 2.0 07/05/2018 0525   MONOABS 0.4 07/05/2018 0525   EOSABS 0.3 07/05/2018 0525   BASOSABS 0.0 07/05/2018 0525    No results found for: POCLITH, LITHIUM   No results found for: PHENYTOIN, PHENOBARB, VALPROATE, CBMZ   .res Assessment: Plan:   Continue Prozac 60 mg daily for depression and anxiety. Patient to follow-up in 1 year or sooner if clinically indicated. Patient advised to contact office with any questions, adverse effects, or acute worsening in signs and symptoms.  Depression, unspecified depression type - Plan: FLUoxetine (PROZAC) 20 MG capsule  Please see After Visit Summary for patient specific instructions.  No future appointments.  No orders of the defined types were placed in this encounter.     -------------------------------

## 2018-10-24 ENCOUNTER — Other Ambulatory Visit: Payer: Self-pay | Admitting: Interventional Cardiology

## 2018-10-24 MED ORDER — EZETIMIBE 10 MG PO TABS: 10.0000 mg | ORAL_TABLET | Freq: Every day | ORAL | 3 refills | Status: AC

## 2018-11-13 DIAGNOSIS — E1169 Type 2 diabetes mellitus with other specified complication: Secondary | ICD-10-CM | POA: Diagnosis not present

## 2018-11-29 DIAGNOSIS — H04123 Dry eye syndrome of bilateral lacrimal glands: Secondary | ICD-10-CM | POA: Diagnosis not present

## 2018-11-29 DIAGNOSIS — H43393 Other vitreous opacities, bilateral: Secondary | ICD-10-CM | POA: Diagnosis not present

## 2018-11-29 DIAGNOSIS — E119 Type 2 diabetes mellitus without complications: Secondary | ICD-10-CM | POA: Diagnosis not present

## 2018-11-29 DIAGNOSIS — Z961 Presence of intraocular lens: Secondary | ICD-10-CM | POA: Diagnosis not present

## 2018-11-29 DIAGNOSIS — H17821 Peripheral opacity of cornea, right eye: Secondary | ICD-10-CM | POA: Diagnosis not present

## 2018-11-29 DIAGNOSIS — H52211 Irregular astigmatism, right eye: Secondary | ICD-10-CM | POA: Diagnosis not present

## 2018-11-29 DIAGNOSIS — H2511 Age-related nuclear cataract, right eye: Secondary | ICD-10-CM | POA: Diagnosis not present

## 2019-04-02 IMAGING — CT CT ANGIO CHEST
2 of 6 series · 18 of 36 positions shown · IV contrast (iopamidol)
Comparison: None.

CLINICAL DATA: 67-year-old male with history of chest
discomfort/pressure for the past 2 hours with some pain radiating
into the jaw. Dizziness with nausea and vomiting. Prior history of
embolism.

EXAM:
CT ANGIOGRAPHY CHEST WITH CONTRAST
TECHNIQUE: Multidetector CT imaging of the chest was performed using the
standard protocol during bolus administration of intravenous
contrast. Multiplanar CT image reconstructions and MIPs were
obtained to evaluate the vascular anatomy.
CONTRAST:  100mL [id number] IOPAMIDOL ([id number]) INJECTION 76%

[Series 7: pe thins · axial · 0.98mm/px · z∈[+217,+428]mm · 17 of 335 slices shown]
[im 17/335  lung]
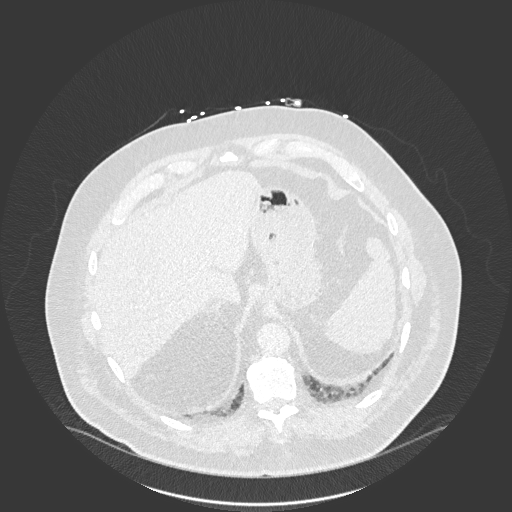
[im 34/335  mediastinal]
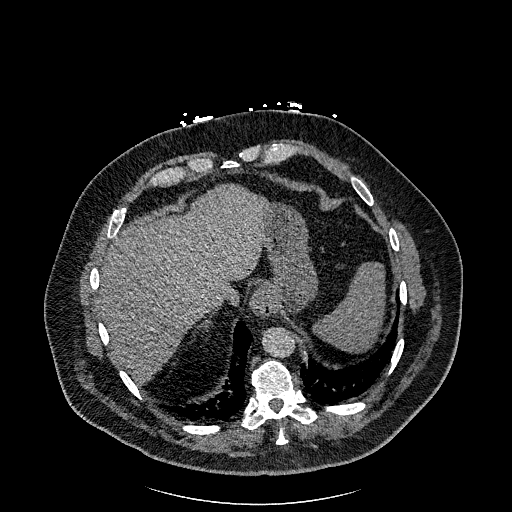
[im 51/335  lung]
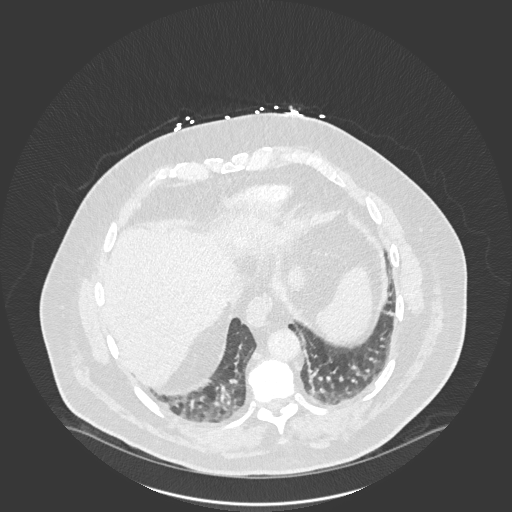
[im 67/335  mediastinal]
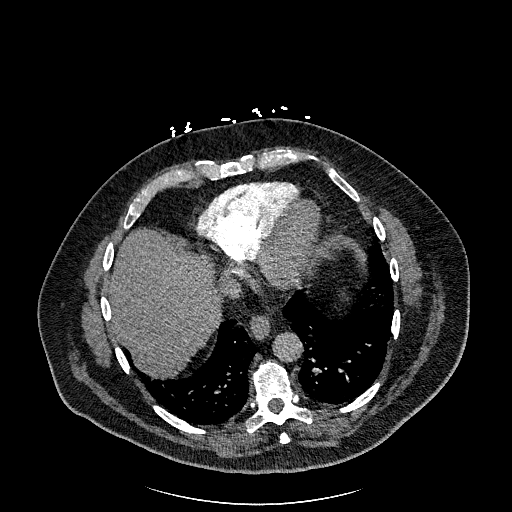
[im 101/335  lung]
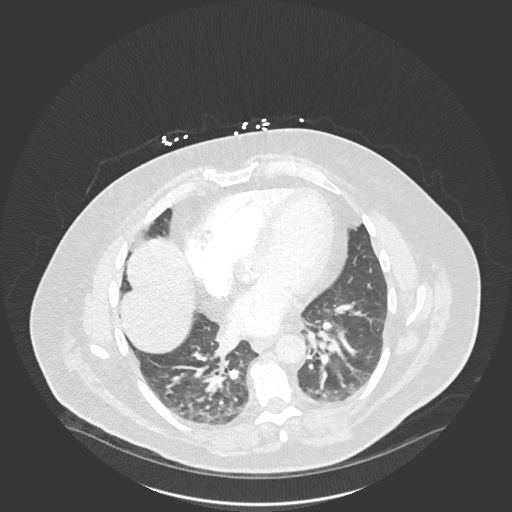
[im 117/335  mediastinal]
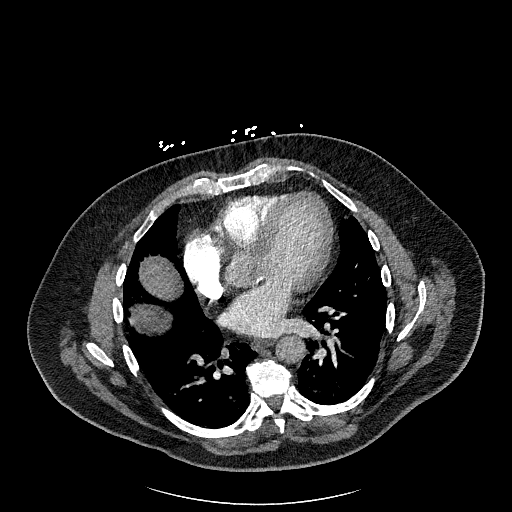
[im 134/335  lung]
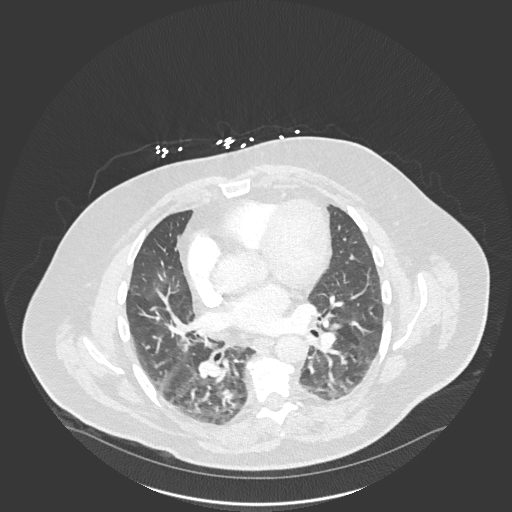
[im 151/335  mediastinal]
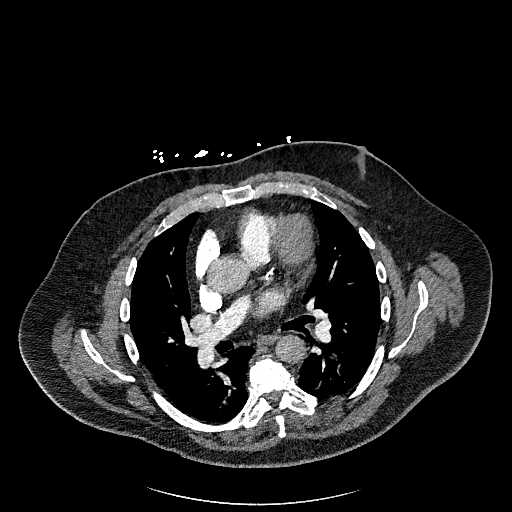
[im 168/335  lung]
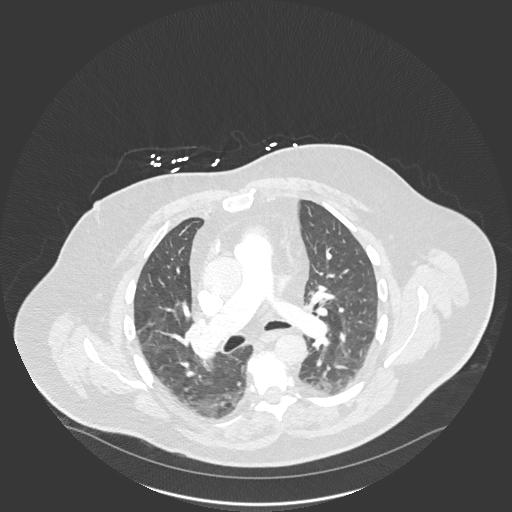
[im 184/335  mediastinal]
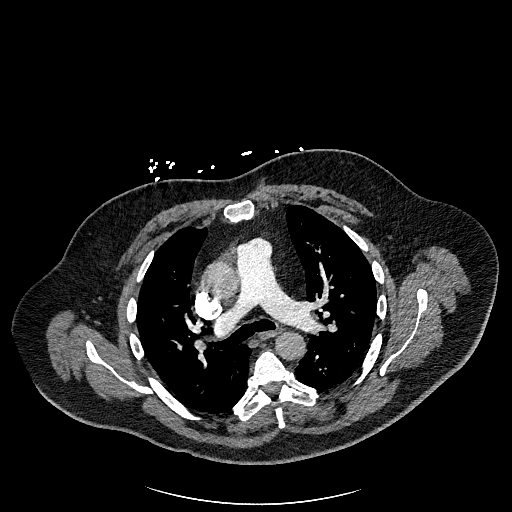
[im 201/335  lung]
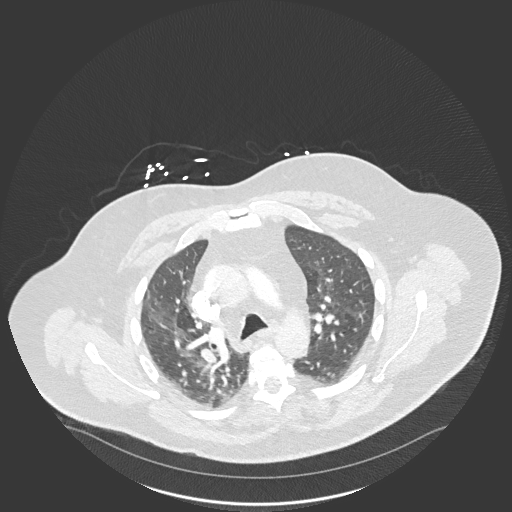
[im 218/335  mediastinal]
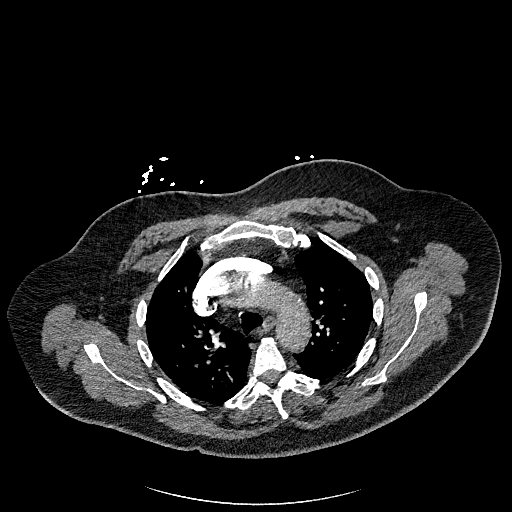
[im 234/335  lung]
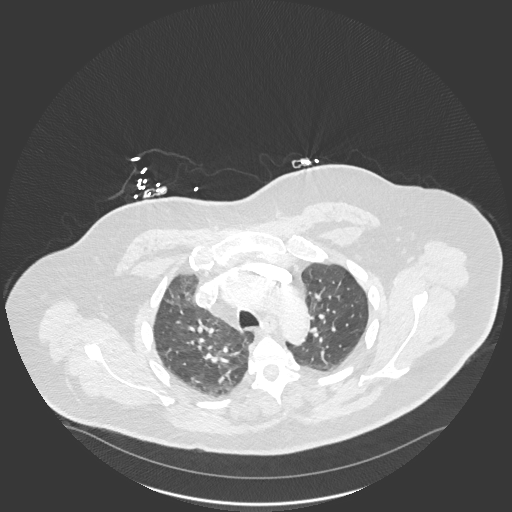
[im 268/335  mediastinal]
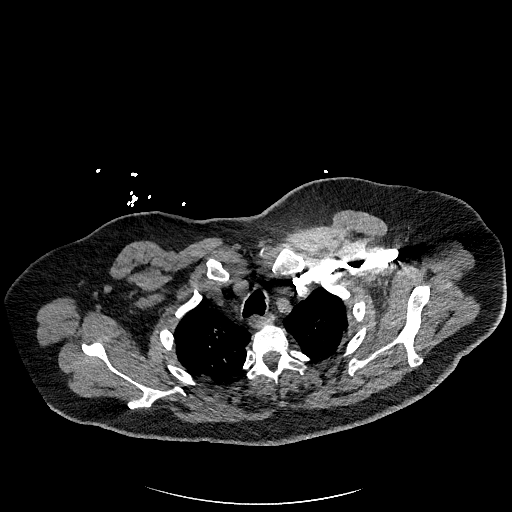
[im 284/335  lung]
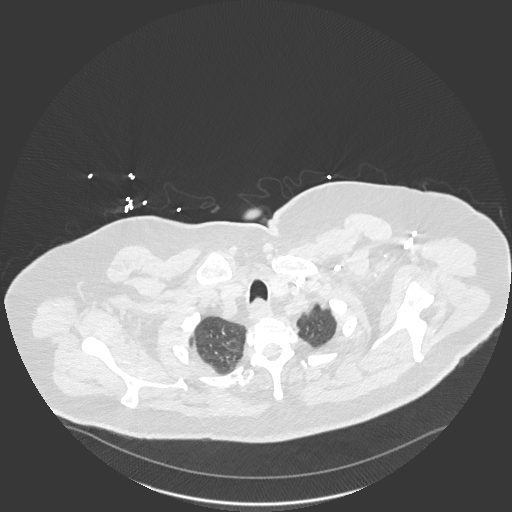
[im 301/335  mediastinal]
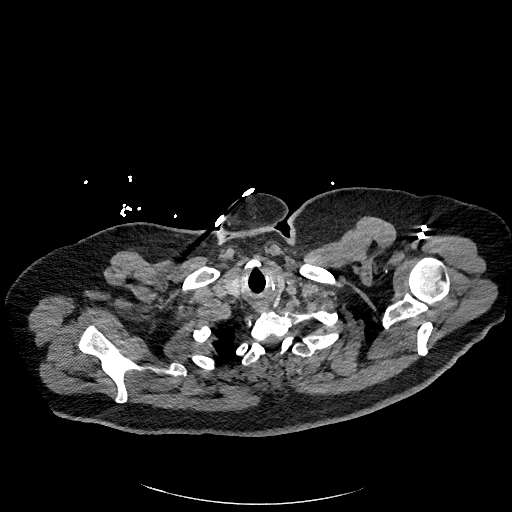
[im 318/335  lung]
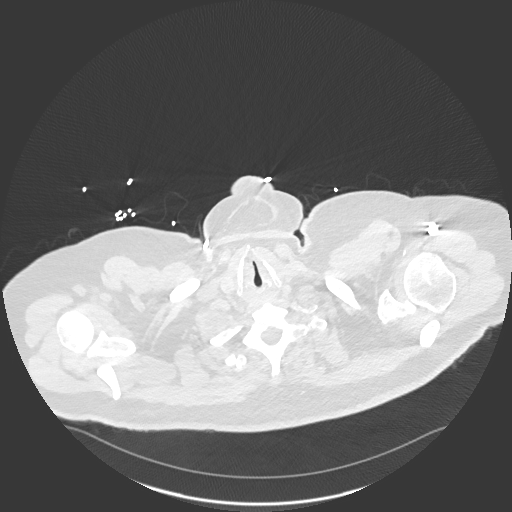

[Series 8: pe 2mm cor · coronal · 0.50mm/px · 1 of 165 slices shown]
[im 83/165  mediastinal]
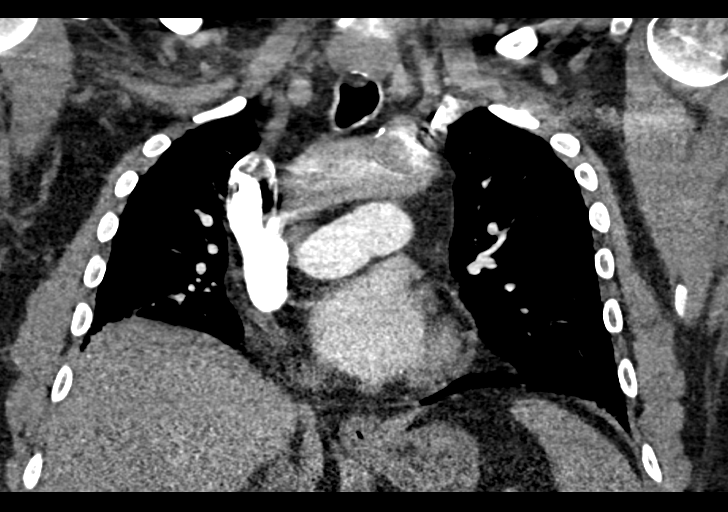

[18 of 36 positions shown; findings below may reference images not displayed]

FINDINGS: Cardiovascular: No filling defect in the pulmonary arterial tree to
suggest underlying pulmonary embolism. Heart size is normal. There
is no significant pericardial fluid, thickening or pericardial
calcification. There is aortic atherosclerosis, as well as
atherosclerosis of the great vessels of the mediastinum and the
coronary arteries, including calcified atherosclerotic plaque in the
left main, left anterior descending and left circumflex coronary
arteries. Calcifications of the aortic valve.

Mediastinum/Nodes: No pathologically enlarged mediastinal or hilar
lymph nodes. Esophagus is unremarkable in appearance. No axillary
lymphadenopathy.

Lungs/Pleura: Mild mosaic attenuation throughout the lungs, which
may suggest the presence of some air trapping. No consolidative
airspace disease. No pleural effusions. No suspicious appearing
pulmonary nodules or masses are noted.

Upper Abdomen: Mild diffuse low attenuation throughout the
visualized hepatic parenchyma, indicative of hepatic steatosis.

Musculoskeletal: There are no aggressive appearing lytic or blastic
lesions noted in the visualized portions of the skeleton.

Review of the MIP images confirms the above findings.
IMPRESSION: 1. No evidence of pulmonary embolism.
2. Probable air trapping in the lungs.
3. Aortic atherosclerosis, in addition to left main and 2 vessel
coronary artery disease. Please note that although the presence of
coronary artery calcium documents the presence of coronary artery
disease, the severity of this disease and any potential stenosis
cannot be assessed on this non-gated CT examination. Assessment for
potential risk factor modification, dietary therapy or pharmacologic
therapy may be warranted, if clinically indicated.
4. There are calcifications of the aortic valve. Echocardiographic
correlation for evaluation of potential valvular dysfunction may be
warranted if clinically indicated.
5. Hepatic steatosis.

Aortic Atherosclerosis ([id number]).

## 2019-04-02 IMAGING — DX DG CHEST 1V PORT
1 series · 2 of 2 positions shown · non-contrast
Comparison: 10/22/2015

CLINICAL DATA: Central chest pain and tightness which
shortness-of-breath.

EXAM:
PORTABLE CHEST 1 VIEW

[Series 1: chest · 0.14mm/px · 2 of 2 slices shown]
[im 1/2]
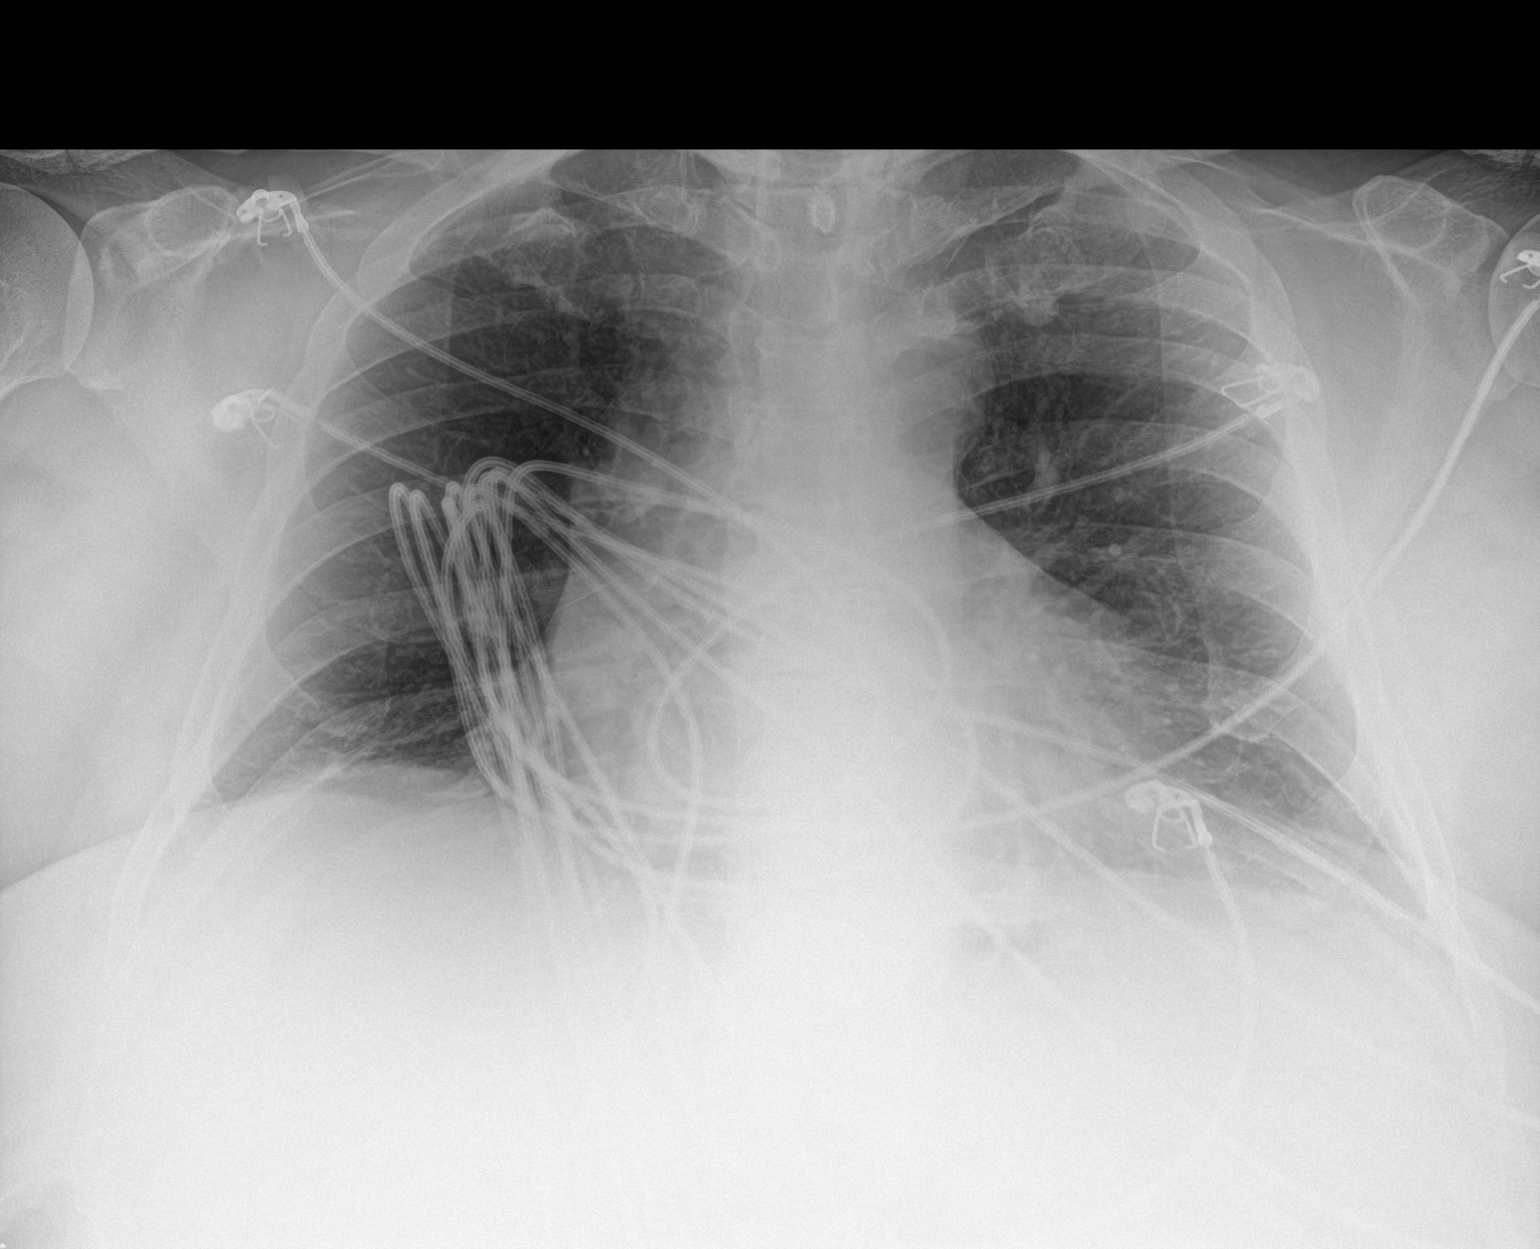
[im 2/2]
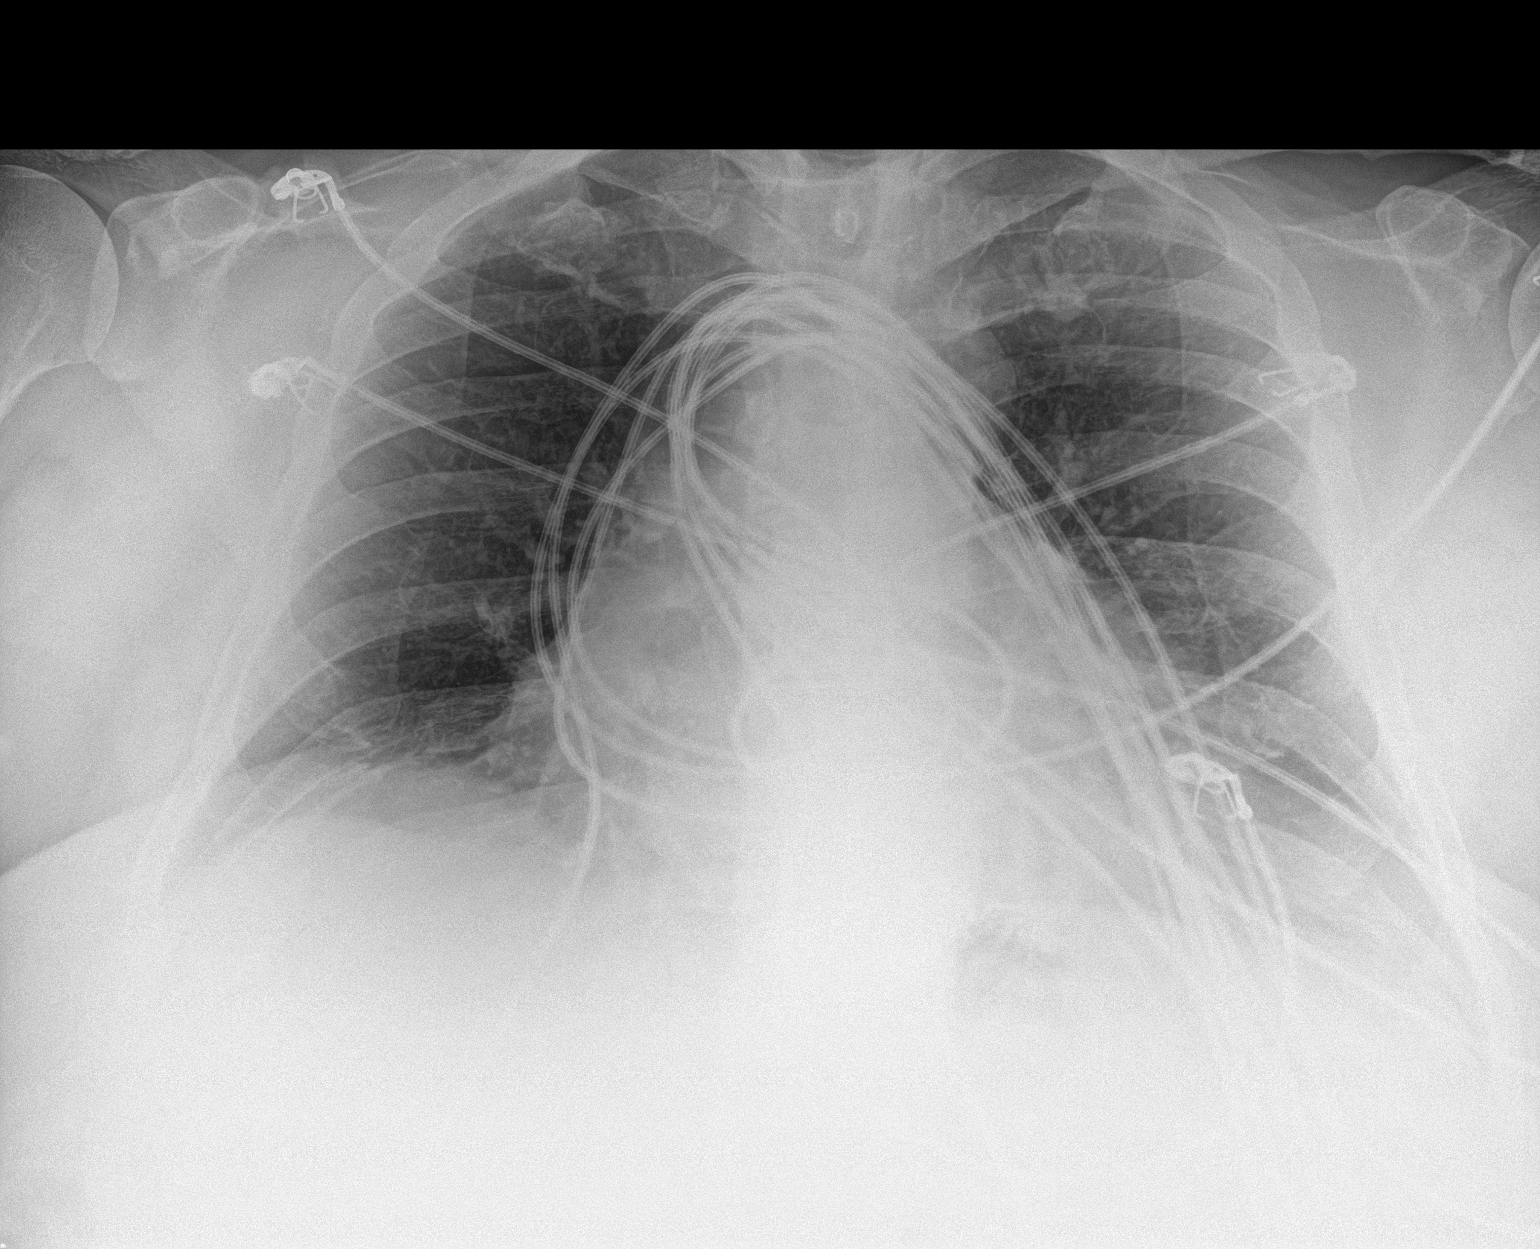

[2 of 2 positions shown; findings below may reference images not displayed]

FINDINGS: Lungs are somewhat hypoinflated without focal airspace consolidation
or effusion. Cardiomediastinal silhouette and remainder the exam is
unchanged.
IMPRESSION: No active disease.

## 2019-04-19 DIAGNOSIS — E1169 Type 2 diabetes mellitus with other specified complication: Secondary | ICD-10-CM | POA: Diagnosis not present

## 2019-04-19 DIAGNOSIS — I25118 Atherosclerotic heart disease of native coronary artery with other forms of angina pectoris: Secondary | ICD-10-CM | POA: Diagnosis not present

## 2019-04-19 DIAGNOSIS — Z0000 Encounter for general adult medical examination without abnormal findings: Secondary | ICD-10-CM | POA: Diagnosis not present

## 2019-04-19 DIAGNOSIS — F3341 Major depressive disorder, recurrent, in partial remission: Secondary | ICD-10-CM | POA: Diagnosis not present

## 2019-04-19 DIAGNOSIS — G4733 Obstructive sleep apnea (adult) (pediatric): Secondary | ICD-10-CM | POA: Diagnosis not present

## 2019-04-19 DIAGNOSIS — E7800 Pure hypercholesterolemia, unspecified: Secondary | ICD-10-CM | POA: Diagnosis not present

## 2019-04-19 DIAGNOSIS — Z125 Encounter for screening for malignant neoplasm of prostate: Secondary | ICD-10-CM | POA: Diagnosis not present

## 2019-04-19 DIAGNOSIS — I10 Essential (primary) hypertension: Secondary | ICD-10-CM | POA: Diagnosis not present

## 2019-04-19 DIAGNOSIS — F419 Anxiety disorder, unspecified: Secondary | ICD-10-CM | POA: Diagnosis not present

## 2019-04-19 DIAGNOSIS — I700 Atherosclerosis of aorta: Secondary | ICD-10-CM | POA: Diagnosis not present

## 2019-04-19 DIAGNOSIS — Z1389 Encounter for screening for other disorder: Secondary | ICD-10-CM | POA: Diagnosis not present

## 2019-04-30 DIAGNOSIS — G4733 Obstructive sleep apnea (adult) (pediatric): Secondary | ICD-10-CM | POA: Diagnosis not present

## 2019-07-09 ENCOUNTER — Other Ambulatory Visit: Payer: Self-pay

## 2019-07-09 DIAGNOSIS — F32A Depression, unspecified: Secondary | ICD-10-CM

## 2019-07-09 DIAGNOSIS — F329 Major depressive disorder, single episode, unspecified: Secondary | ICD-10-CM

## 2019-07-09 MED ORDER — FLUOXETINE HCL 20 MG PO CAPS: ORAL_CAPSULE | ORAL | 3 refills | Status: DC

## 2019-07-09 MED ORDER — METOPROLOL SUCCINATE ER 25 MG PO TB24: 25.0000 mg | ORAL_TABLET | Freq: Every day | ORAL | 1 refills | Status: DC

## 2019-07-09 MED ORDER — CLOPIDOGREL BISULFATE 75 MG PO TABS: 75.0000 mg | ORAL_TABLET | Freq: Every day | ORAL | 1 refills | Status: DC

## 2019-09-12 ENCOUNTER — Other Ambulatory Visit: Payer: Self-pay | Admitting: Interventional Cardiology

## 2019-11-13 ENCOUNTER — Other Ambulatory Visit: Payer: Self-pay

## 2019-11-13 ENCOUNTER — Encounter: Payer: Self-pay | Admitting: Interventional Cardiology

## 2019-11-13 ENCOUNTER — Ambulatory Visit (INDEPENDENT_AMBULATORY_CARE_PROVIDER_SITE_OTHER): Payer: Medicare Other | Admitting: Interventional Cardiology

## 2019-11-13 VITALS — BP 124/78 | HR 58 | Ht 71.25 in | Wt 289.8 lb

## 2019-11-13 DIAGNOSIS — Z794 Long term (current) use of insulin: Secondary | ICD-10-CM

## 2019-11-13 DIAGNOSIS — I10 Essential (primary) hypertension: Secondary | ICD-10-CM | POA: Diagnosis not present

## 2019-11-13 DIAGNOSIS — I25118 Atherosclerotic heart disease of native coronary artery with other forms of angina pectoris: Secondary | ICD-10-CM

## 2019-11-13 DIAGNOSIS — E7841 Elevated Lipoprotein(a): Secondary | ICD-10-CM | POA: Diagnosis not present

## 2019-11-13 DIAGNOSIS — I77810 Thoracic aortic ectasia: Secondary | ICD-10-CM

## 2019-11-13 DIAGNOSIS — E1159 Type 2 diabetes mellitus with other circulatory complications: Secondary | ICD-10-CM | POA: Diagnosis not present

## 2019-11-13 DIAGNOSIS — Z7189 Other specified counseling: Secondary | ICD-10-CM

## 2019-11-13 NOTE — Progress Notes (Signed)
Cardiology Office Note:    Date:  11/13/2019   ID:  Scott Young, DOB [birth year], MRN [id number]  PCP:  Renford Dills, MD  Cardiologist:  Lesleigh Noe, MD   Referring MD: Renford Dills, MD   Chief Complaint  Patient presents with  . Coronary Artery Disease  . Atrial Fibrillation    History of Present Illness:    Scott Young is a 69 y.o. male with a hx of hypertension, hyperlipidemia andsleep apnea, recurring CP, non obstructive coronaries on cardiac catheterization in 07/04/2018.  He is doing relatively well.  No exertional discomfort.  The [employer] provides free gym membership.  He has taken advantage of this and is doing greater than 150 minutes of moderate activity per week.  Exertion does not precipitate chest discomfort.  Endurance has improved.    Past Medical History:  Diagnosis Date  . Depression   . H/O seasonal allergies    tx. OTC meds  . Hyperlipidemia   . Hypertension   . Prostatitis   . Sleep apnea    cpap  . Umbilical hernia    resolved -s/p surgical repair    Past Surgical History:  Procedure Laterality Date  . APPENDECTOMY    . CATARACT EXTRACTION Left    30 yrs ago  . COLONOSCOPY      multiple- x1 colon polyp removed  . COLONOSCOPY WITH PROPOFOL N/A 10/22/2014   Procedure: COLONOSCOPY WITH PROPOFOL;  Surgeon: Charolett Bumpers, MD;  Location: [facility];  Service: Endoscopy;  Laterality: N/A;  . EYE SURGERY     choriziom removed right eye 12'15  . HERNIA REPAIR     umbilical hernia repair  . LEFT HEART CATH AND CORONARY ANGIOGRAPHY N/A 07/04/2018   Procedure: LEFT HEART CATH AND CORONARY ANGIOGRAPHY;  Surgeon: Lyn Records, MD;  Location: [facility];  Service: Cardiovascular;  Laterality: N/A;  . TONSILLECTOMY      Current Medications: Current Meds  Medication Sig  . aspirin 81 MG tablet Take 81 mg by mouth at bedtime.   . clopidogrel (PLAVIX) 75 MG tablet TAKE 1 TABLET BY MOUTH  DAILY WITH BREAKFAST   . Coenzyme Q10 (COQ10 PO) Take 100 mg by mouth daily.  . cyanocobalamin 1000 MCG tablet Take 1,000 mcg by mouth daily.  Marland Kitchen ezetimibe (ZETIA) 10 MG tablet Take 1 tablet (10 mg total) by mouth daily.  . fexofenadine (ALLEGRA) 180 MG tablet Take 180 mg by mouth daily.  Marland Kitchen FLUoxetine (PROZAC) 20 MG capsule TAKE 3 CAPSULES EVERY DAY  . fluticasone (FLONASE) 50 MCG/ACT nasal spray Place 1 spray into both nostrils daily.  Marland Kitchen losartan (COZAAR) 25 MG tablet Take 25 mg by mouth daily.  . metFORMIN (GLUCOPHAGE-XR) 750 MG 24 hr tablet Take 750 mg by mouth daily.  . metoprolol succinate (TOPROL-XL) 25 MG 24 hr tablet TAKE 1 TABLET BY MOUTH  DAILY  . Misc Natural Products (PROSTATE SUPPORT PO) Take by mouth.  . nitroGLYCERIN (NITROSTAT) 0.4 MG SL tablet Place 1 tablet (0.4 mg total) under the tongue every 5 (five) minutes as needed for chest pain.  Marland Kitchen pyridOXINE (VITAMIN B-6) 100 MG tablet Take 100 mg by mouth daily.  . Selenium 200 MCG CAPS Take 1 capsule by mouth daily.  Marland Kitchen triamcinolone cream (KENALOG) 0.1 % Apply 1 application topically daily as needed (for irritation).   . [DISCONTINUED] metFORMIN (GLUCOPHAGE) 1000 MG tablet Take 1,000 mg by mouth daily with breakfast.     Allergies:  Codeine   Social History   Socioeconomic History  . Marital status: Married    Spouse name: Not on file  . Number of children: Not on file  . Years of education: Not on file  . Highest education level: Not on file  Occupational History  . Not on file  Tobacco Use  . Smoking status: Former Games developer  . Smokeless tobacco: Former Neurosurgeon    Quit date: 09/23/1970  Substance and Sexual Activity  . Alcohol use: No  . Drug use: No  . Sexual activity: Not on file  Other Topics Concern  . Not on file  Social History Narrative  . Not on file   Social Determinants of Health   Financial Resource Strain:   . Difficulty of Paying Living Expenses:   Food Insecurity:   . Worried About Programme researcher, broadcasting/film/video in the Last Year:    . Barista in the Last Year:   Transportation Needs:   . Freight forwarder (Medical):   Marland Kitchen Lack of Transportation (Non-Medical):   Physical Activity:   . Days of Exercise per Week:   . Minutes of Exercise per Session:   Stress:   . Feeling of Stress :   Social Connections:   . Frequency of Communication with Friends and Family:   . Frequency of Social Gatherings with Friends and Family:   . Attends Religious Services:   . Active Member of Clubs or Organizations:   . Attends Banker Meetings:   Marland Kitchen Marital Status:      Family History: The patient's family history includes Aneurysm in his brother and father; Cancer in his father; Heart disease in his mother.  ROS:   Please see the history of present illness.    He has decided not to be vaccinated.  Since he started in the gym he has having sensitivity and soreness in his knee joints in particular.  All other systems reviewed and are negative.  EKGs/Labs/Other Studies Reviewed:    The following studies were reviewed today: Cardiac catheterization performed in January 2020: Diagnostic Dominance: Right    EKG:  EKG sinus bradycardia, first-degree AV block, and when compared to prior tracings, the first-degree AV block is more severe at 272 ms compared with 235 ms.  Recent Labs: No results found for requested labs within last 8760 hours.  Recent Lipid Panel    Component Value Date/Time   CHOL 187 07/04/2018 0156   TRIG 97 07/04/2018 0156   HDL 47 07/04/2018 0156   CHOLHDL 4.0 07/04/2018 0156   VLDL 19 07/04/2018 0156   LDLCALC 121 (H) 07/04/2018 0156    Physical Exam:    VS:  BP 124/78   Pulse (!) 58   Ht 5' 11.25" (1.81 m)   Wt 289 lb 12.8 oz (131.5 kg)   SpO2 99%   BMI 40.14 kg/m     Wt Readings from Last 3 Encounters:  11/13/19 289 lb 12.8 oz (131.5 kg)  10/19/18 265 lb (120.2 kg)  07/18/18 280 lb 12.8 oz (127.4 kg)     GEN: Obese with weight gain compared to 1 year ago this time  of approximately 24 pounds.. No acute distress HEENT: Normal NECK: No JVD. LYMPHATICS: No lymphadenopathy CARDIAC:  RRR without murmur, gallop, or edema. VASCULAR:  Normal Pulses. No bruits. RESPIRATORY:  Clear to auscultation without rales, wheezing or rhonchi  ABDOMEN: Soft, non-tender, non-distended, No pulsatile mass, MUSCULOSKELETAL: No deformity  SKIN: Warm and dry NEUROLOGIC:  Alert and oriented x 3 PSYCHIATRIC:  Normal affect   ASSESSMENT:    1. Coronary artery disease of native artery of native heart with stable angina pectoris ([place])   2. Controlled type 2 diabetes mellitus with other circulatory complication, with long-term current use of insulin ([place])   3. Essential hypertension   4. Elevated lipoprotein(a)   5. Ascending aorta dilatation ([facility])   6. Educated about COVID-19 virus infection    PLAN:    In order of problems listed above:  1. We discussed weight loss by changing the components of his diet, decreasing the amount of carbohydrates relative to vegetables and fruit.  He needs to continue attempts to get 150 minutes of moderate activity per week. 2. Most recent A1c was 6.9.  Glucophage 750 mg daily is being used.  Consider SGLT2 therapy. 3. Blood pressure is controlled on Cozaar 25 mg/day. 4. No specific treatment.  Aspirin 1/day as prescribed. 5. Last assessment of aorta revealed a diameter of 4 cm based on echocardiographic data.  CT scan did not mention aortic enlargement.  This will need follow-up perhaps next year. 6. He prefers not to have COVID-19 vaccination.  States he does not mind wearing a mask indefinitely.   Medication Adjustments/Labs and Tests Ordered: Current medicines are reviewed at length with the patient today.  Concerns regarding medicines are outlined above.  Orders Placed This Encounter  Procedures  . EKG 12-Lead   No orders of the defined types were placed in this encounter.   There are no Patient Instructions on file for this  visit.   Signed, Sinclair Grooms, MD  11/13/2019 11:49 AM    [place]

## 2019-11-13 NOTE — Patient Instructions (Signed)

## 2019-11-14 ENCOUNTER — Ambulatory Visit: Payer: Medicare Other | Admitting: Interventional Cardiology

## 2020-01-17 ENCOUNTER — Other Ambulatory Visit: Payer: Self-pay | Admitting: Interventional Cardiology

## 2020-03-31 ENCOUNTER — Other Ambulatory Visit: Payer: Self-pay | Admitting: Psychiatry

## 2020-03-31 DIAGNOSIS — F32A Depression, unspecified: Secondary | ICD-10-CM

## 2020-04-18 ENCOUNTER — Other Ambulatory Visit: Payer: Self-pay | Admitting: Psychiatry

## 2020-04-18 DIAGNOSIS — F32A Depression, unspecified: Secondary | ICD-10-CM

## 2020-04-21 NOTE — Telephone Encounter (Signed)
Last apt 10/2018 

## 2020-07-12 ENCOUNTER — Other Ambulatory Visit: Payer: Self-pay | Admitting: Psychiatry

## 2020-07-12 DIAGNOSIS — F32A Depression, unspecified: Secondary | ICD-10-CM

## 2020-08-05 ENCOUNTER — Ambulatory Visit (INDEPENDENT_AMBULATORY_CARE_PROVIDER_SITE_OTHER): Payer: Medicare Other | Admitting: Psychiatry

## 2020-08-05 ENCOUNTER — Encounter: Payer: Self-pay | Admitting: Psychiatry

## 2020-08-05 ENCOUNTER — Other Ambulatory Visit: Payer: Self-pay

## 2020-08-05 DIAGNOSIS — F32A Depression, unspecified: Secondary | ICD-10-CM | POA: Diagnosis not present

## 2020-08-05 MED ORDER — FLUOXETINE HCL 20 MG PO CAPS: ORAL_CAPSULE | ORAL | 3 refills | Status: DC

## 2020-08-05 NOTE — Progress Notes (Signed)
Scott Young [id number] 1951/03/20 70 y.o.  Subjective:   Patient ID:  Scott Young is a 70 y.o. (DOB Jan 13, 1951) male.  Chief Complaint:  Chief Complaint  Patient presents with  . Follow-up    H/o depression     HPI Scott Young presents to the office today for follow-up of history of depression. He reports that his mood has been stable. Denies depressed mood or irritability. Denies any recent anxiety. Sleeping well. He reports that he has not been having vivid dreams like he has in the past. Continues to have dreams. Appetite has been "too good." He reports that his energy level has improved. He reports that he had a period of lower energy later in the day. He reports that energy has improved with vitamin supplementations. Motivation has been good. "I have actually been feeling better and doing better than I have in a long time." He reports feeling more focused and less distracted. Denies SI.   He continues to pastor.  Daughter is 84 yo has a progressive neuromuscular disease. Daughter is now disabled.   Review of Systems:  Review of Systems  Gastrointestinal: Negative.   Musculoskeletal: Negative for gait problem.  Neurological: Negative for tremors and headaches.  Psychiatric/Behavioral:       Please refer to HPI     He reports that he and his wife had COVID in late September.   Medications: I have reviewed the patient's current medications.  Current Outpatient Medications  Medication Sig Dispense Refill  . Ascorbic Acid (VITAMIN C) 1000 MG tablet Take 1,000 mg by mouth daily.    Marland Kitchen aspirin 81 MG tablet Take 81 mg by mouth at bedtime.     . clopidogrel (PLAVIX) 75 MG tablet TAKE 1 TABLET BY MOUTH  DAILY WITH BREAKFAST 90 tablet 3  . Coenzyme Q10 (COQ10 PO) Take 100 mg by mouth daily.    . cyanocobalamin 1000 MCG tablet Take 1,000 mcg by mouth daily.    Marland Kitchen ezetimibe (ZETIA) 10 MG tablet Take 1 tablet (10 mg total) by mouth daily. 90 tablet 3  . fexofenadine (ALLEGRA) 180  MG tablet Take 180 mg by mouth daily.    . fluticasone (FLONASE) 50 MCG/ACT nasal spray Place 1 spray into both nostrils daily.    Marland Kitchen losartan (COZAAR) 25 MG tablet Take 25 mg by mouth daily.    . Magnesium 100 MG CAPS Take by mouth.    . metoprolol succinate (TOPROL-XL) 25 MG 24 hr tablet TAKE 1 TABLET BY MOUTH  DAILY 90 tablet 3  . Misc Natural Products (PROSTATE SUPPORT PO) Take by mouth.    . Multiple Vitamins-Minerals (ZINC PO) Take by mouth.    . pyridOXINE (VITAMIN B-6) 100 MG tablet Take 100 mg by mouth daily.    . Selenium 200 MCG CAPS Take 1 capsule by mouth daily.    . Thiamine HCl (VITAMIN B-1 PO) Take by mouth.    . triamcinolone cream (KENALOG) 0.1 % Apply 1 application topically daily as needed (for irritation).     Marland Kitchen VITAMIN D PO Take by mouth.    Marland Kitchen FLUoxetine (PROZAC) 20 MG capsule TAKE 3 CAPSULES BY MOUTH  DAILY 270 capsule 3  . metFORMIN (GLUCOPHAGE-XR) 750 MG 24 hr tablet Take 750 mg by mouth daily. (Patient not taking: Reported on 08/05/2020)    . nitroGLYCERIN (NITROSTAT) 0.4 MG SL tablet Place 1 tablet (0.4 mg total) under the tongue every 5 (five) minutes as needed for chest pain. 30 tablet 1  No current facility-administered medications for this visit.    Medication Side Effects: None  Allergies:  Allergies  Allergen Reactions  . Codeine Nausea And Vomiting    Past Medical History:  Diagnosis Date  . Depression   . H/O seasonal allergies    tx. OTC meds  . Hyperlipidemia   . Hypertension   . Prostatitis   . Sleep apnea    cpap  . Umbilical hernia    resolved -s/p surgical repair    Family History  Problem Relation Age of Onset  . Heart disease Mother   . Cancer Father        colon  . Aneurysm Father   . Aneurysm Brother     Social History   Socioeconomic History  . Marital status: Married    Spouse name: Not on file  . Number of children: Not on file  . Years of education: Not on file  . Highest education level: Not on file   Occupational History  . Not on file  Tobacco Use  . Smoking status: Former Games developer  . Smokeless tobacco: Former Neurosurgeon    Quit date: 09/23/1970  Vaping Use  . Vaping Use: Never used  Substance and Sexual Activity  . Alcohol use: No  . Drug use: No  . Sexual activity: Not on file  Other Topics Concern  . Not on file  Social History Narrative  . Not on file   Social Determinants of Health   Financial Resource Strain: Not on file  Food Insecurity: Not on file  Transportation Needs: Not on file  Physical Activity: Not on file  Stress: Not on file  Social Connections: Not on file  Intimate Partner Violence: Not on file    Past Medical History, Surgical history, Social history, and Family history were reviewed and updated as appropriate.   Please see review of systems for further details on the patient's review from today.   Objective:   Physical Exam:  There were no vitals taken for this visit.  Physical Exam Constitutional:      General: He is not in acute distress. Musculoskeletal:        General: No deformity.  Neurological:     Mental Status: He is alert and oriented to person, place, and time.     Coordination: Coordination normal.  Psychiatric:        Attention and Perception: Attention and perception normal. He does not perceive auditory or visual hallucinations.        Mood and Affect: Mood normal. Mood is not anxious or depressed. Affect is not labile, blunt, angry or inappropriate.        Speech: Speech normal.        Behavior: Behavior normal.        Thought Content: Thought content normal. Thought content is not paranoid or delusional. Thought content does not include homicidal or suicidal ideation. Thought content does not include homicidal or suicidal plan.        Cognition and Memory: Cognition and memory normal.        Judgment: Judgment normal.     Comments: Insight intact     Lab Review:     Component Value Date/Time   NA 137 07/05/2018 0525   K  4.0 07/05/2018 0525   CL 107 07/05/2018 0525   CO2 22 07/05/2018 0525   GLUCOSE 115 (H) 07/05/2018 0525   BUN 15 07/05/2018 0525   CREATININE 1.23 07/05/2018 0525   CREATININE 1.20 12/06/2011 1133  CALCIUM 8.6 (L) 07/05/2018 0525   PROT 6.9 07/03/2018 1649   ALBUMIN 4.0 07/03/2018 1649   AST 34 07/03/2018 1649   ALT 24 07/03/2018 1649   ALKPHOS 57 07/03/2018 1649   BILITOT 0.7 07/03/2018 1649   GFRNONAA 60 (L) 07/05/2018 0525   GFRAA >60 07/05/2018 0525       Component Value Date/Time   WBC 6.1 07/05/2018 0525   RBC 4.38 07/05/2018 0525   HGB 13.1 07/05/2018 0525   HCT 40.3 07/05/2018 0525   PLT 169 07/05/2018 0525   MCV 92.0 07/05/2018 0525   MCH 29.9 07/05/2018 0525   MCHC 32.5 07/05/2018 0525   RDW 13.0 07/05/2018 0525   LYMPHSABS 2.0 07/05/2018 0525   MONOABS 0.4 07/05/2018 0525   EOSABS 0.3 07/05/2018 0525   BASOSABS 0.0 07/05/2018 0525    No results found for: POCLITH, LITHIUM   No results found for: PHENYTOIN, PHENOBARB, VALPROATE, CBMZ   .res Assessment: Plan:   Will continue current plan of care since target signs and symptoms are well controlled without any tolerability issues. Continue Prozac 60 mg po qd for mood.  Pt to follow-up in 1 year or sooner if clinically indicated.  Patient advised to contact office with any questions, adverse effects, or acute worsening in signs and symptoms.  Scott Young was seen today for follow-up.  Diagnoses and all orders for this visit:  Depression, unspecified depression type -     FLUoxetine (PROZAC) 20 MG capsule; TAKE 3 CAPSULES BY MOUTH  DAILY     Please see After Visit Summary for patient specific instructions.  Future Appointments  Date Time Provider Department Center  08/04/2021 11:00 AM Corie Chiquito, PMHNP [facility] None    No orders of the defined types were placed in this encounter.   -------------------------------

## 2020-11-20 ENCOUNTER — Other Ambulatory Visit: Payer: Self-pay | Admitting: Internal Medicine

## 2020-11-20 DIAGNOSIS — R4182 Altered mental status, unspecified: Secondary | ICD-10-CM

## 2020-11-20 DIAGNOSIS — R519 Headache, unspecified: Secondary | ICD-10-CM

## 2020-11-24 ENCOUNTER — Other Ambulatory Visit: Payer: Medicare Other

## 2020-11-30 ENCOUNTER — Other Ambulatory Visit: Payer: Self-pay | Admitting: Interventional Cardiology

## 2021-01-16 NOTE — Progress Notes (Signed)
Cardiology Office Note:    Date:  01/19/2021   ID:  Scott Young, DOB 1951/04/13, MRN [id number]  PCP:  Renford Dills, MD  Cardiologist:  Lesleigh Noe, MD   Referring MD: Renford Dills, MD   Chief Complaint  Patient presents with   Coronary Artery Disease   Hypertension    History of Present Illness:    Scott Young is a 70 y.o. male with a hx of hypertension, hyperlipidemia and sleep apnea, recurring CP, non obstructive coronaries on cardiac catheterization in 07/04/2018.  Aortic diameter by echo 40 mm 2020.  Scott Young is doing okay.  He is not having chest pain.  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  Not able to exercise too vigorously.  Denies orthopnea, PND, lower extremity swelling, syncope, and palpitations.  No medication side effects.  Past Medical History:  Diagnosis Date   Depression    H/O seasonal allergies    tx. OTC meds   Hyperlipidemia    Hypertension    Prostatitis    Sleep apnea    cpap   Umbilical hernia    resolved -s/p surgical repair    Past Surgical History:  Procedure Laterality Date   APPENDECTOMY     CATARACT EXTRACTION Left    30 yrs ago   COLONOSCOPY      multiple- x1 colon polyp removed   COLONOSCOPY WITH PROPOFOL N/A 10/22/2014   Procedure: COLONOSCOPY WITH PROPOFOL;  Surgeon: Charolett Bumpers, MD;  Location: [facility];  Service: Endoscopy;  Laterality: N/A;   EYE SURGERY     choriziom removed right eye 12'15   HERNIA REPAIR     umbilical hernia repair   LEFT HEART CATH AND CORONARY ANGIOGRAPHY N/A 07/04/2018   Procedure: LEFT HEART CATH AND CORONARY ANGIOGRAPHY;  Surgeon: Lyn Records, MD;  Location: [facility];  Service: Cardiovascular;  Laterality: N/A;   TONSILLECTOMY      Current Medications: Current Meds  Medication Sig   Ascorbic Acid (VITAMIN C) 1000 MG tablet Take 1,000 mg by mouth daily.   aspirin 81 MG tablet Take 81 mg by mouth at bedtime.    atorvastatin (LIPITOR) 20 MG tablet Take 20 mg by  mouth daily.   clopidogrel (PLAVIX) 75 MG tablet TAKE 1 TABLET BY MOUTH  DAILY WITH BREAKFAST   Coenzyme Q10 (COQ10 PO) Take 100 mg by mouth daily.   cyanocobalamin 1000 MCG tablet Take 1,000 mcg by mouth daily.   ezetimibe (ZETIA) 10 MG tablet Take 1 tablet (10 mg total) by mouth daily.   fexofenadine (ALLEGRA) 180 MG tablet Take 180 mg by mouth daily.   FLUoxetine (PROZAC) 20 MG capsule TAKE 3 CAPSULES BY MOUTH  DAILY   fluticasone (FLONASE) 50 MCG/ACT nasal spray Place 1 spray into both nostrils daily.   losartan (COZAAR) 25 MG tablet Take 25 mg by mouth daily.   Magnesium 100 MG CAPS Take by mouth.   metFORMIN (GLUCOPHAGE-XR) 750 MG 24 hr tablet Take 750 mg by mouth daily.   Misc Natural Products (PROSTATE SUPPORT PO) Take by mouth.   Multiple Vitamins-Minerals (ZINC PO) Take by mouth.   nitroGLYCERIN (NITROSTAT) 0.4 MG SL tablet Place 1 tablet (0.4 mg total) under the tongue every 5 (five) minutes as needed for chest pain.   ONETOUCH VERIO test strip 1 each daily.   pyridOXINE (VITAMIN B-6) 100 MG tablet Take 100 mg by mouth daily.   Selenium 200 MCG CAPS Take 1 capsule by mouth daily.   Thiamine HCl (  VITAMIN B-1 PO) Take by mouth.   triamcinolone cream (KENALOG) 0.1 % Apply 1 application topically daily as needed (for irritation).    VITAMIN D PO Take by mouth.   [DISCONTINUED] metoprolol succinate (TOPROL-XL) 25 MG 24 hr tablet TAKE 1 TABLET BY MOUTH  DAILY     Allergies:   Codeine   Social History   Socioeconomic History   Marital status: Married    Spouse name: Not on file   Number of children: Not on file   Years of education: Not on file   Highest education level: Not on file  Occupational History   Not on file  Tobacco Use   Smoking status: Former   Smokeless tobacco: Former    Quit date: 09/23/1970  Vaping Use   Vaping Use: Never used  Substance and Sexual Activity   Alcohol use: No   Drug use: No   Sexual activity: Not on file  Other Topics Concern   Not on  file  Social History Narrative   Not on file   Social Determinants of Health   Financial Resource Strain: Not on file  Food Insecurity: Not on file  Transportation Needs: Not on file  Physical Activity: Not on file  Stress: Not on file  Social Connections: Not on file     Family History: The patient's family history includes Aneurysm in Scott brother and father; Cancer in Scott father; Heart disease in Scott mother.  ROS:   Please see the history of present illness.    Scott Young wonders if he should see a neurologist.  She has noticed some pill-rolling motion of Scott index and thumb on both hands.  He has some dizziness when up and down stairs are when initially standing.  All other systems reviewed and are negative.  EKGs/Labs/Other Studies Reviewed:    The following studies were reviewed today: 2D Doppler echocardiogram 2020: Study Conclusions   - Left ventricle: The cavity size was normal. There was mild    concentric hypertrophy. Systolic function was normal. The    estimated ejection fraction was in the range of 60% to 65%. Wall    motion was normal; there were no regional wall motion    abnormalities. Doppler parameters are consistent with abnormal    left ventricular relaxation (grade 1 diastolic dysfunction). The    E/e&' ratio is between 8-15,suggesting indeterminate LV filling    pressure.  - Aorta: Ascending aortic diameter: 40 mm (S).  - Ascending aorta: The ascending aorta was mildly dilated.  - Mitral valve: Mildly thickened leaflets . There was trivial    regurgitation.  - Left atrium: The atrium was normal in size.  - Right atrium: The atrium was mildly dilated.  - Tricuspid valve: There was trivial regurgitation.  - Pulmonary arteries: PA peak pressure: 17 mm Hg (S).  - Inferior vena cava: The vessel was dilated. The respirophasic    diameter changes were blunted (< 50%), consistent with elevated    central venous pressure.   Impressions:   - Compared to a  prior study in 2017, the LVEF is unchanged. The    ascending aorta is dilated to 4.0 cm.   EKG:  EKG normal sinus rhythm, first-degree AV block measured at 296 ms.  Otherwise normal.  When compared to November 13, 2019, first-degree AV block has progressed from 274 ms.  Recent Labs: No results found for requested labs within last 8760 hours.  Recent Lipid Panel    Component Value Date/Time  CHOL 187 07/04/2018 0156   TRIG 97 07/04/2018 0156   HDL 47 07/04/2018 0156   CHOLHDL 4.0 07/04/2018 0156   VLDL 19 07/04/2018 0156   LDLCALC 121 (H) 07/04/2018 0156    Physical Exam:    VS:  BP 102/78   Pulse 65   Ht 5' 11.25" (1.81 m)   Wt 288 lb 9.6 oz (130.9 kg)   SpO2 94%   BMI 39.97 kg/m     Wt Readings from Last 3 Encounters:  01/19/21 288 lb 9.6 oz (130.9 kg)  11/13/19 289 lb 12.8 oz (131.5 kg)  10/19/18 265 lb (120.2 kg)     GEN: Markedly obese. No acute distress HEENT: Normal NECK: No JVD. LYMPHATICS: No lymphadenopathy CARDIAC: 2/6 systolic murmur murmur. RRR S4 gallop but no edema. VASCULAR:  Normal Pulses. No bruits. RESPIRATORY:  Clear to auscultation without rales, wheezing or rhonchi  ABDOMEN: Soft, non-tender, non-distended, No pulsatile mass, MUSCULOSKELETAL: No deformity  SKIN: Warm and dry NEUROLOGIC:  Alert and oriented x 3 PSYCHIATRIC:  Normal affect   ASSESSMENT:    1. Coronary artery disease of native artery of native heart with stable angina pectoris (HCC)   2. Controlled type 2 diabetes mellitus with other circulatory complication, with long-term current use of insulin (HCC)   3. Essential hypertension   4. Elevated lipoprotein(a)   5. Ascending aorta dilatation (HCC)   6. First degree AV block    PLAN:    In order of problems listed above:  Secondary prevention discussed. Most recent A1c was greater than 7.  We discussed decreasing caloric intake.  He is also a candidate for SGLT2 therapy given background vascular disease. Blood pressures well  controlled.  I repeated the blood pressure in the right arm and it is 118/70.  In conjunction with significant first-degree AV block, I will wean and discontinue low-dose metoprolol. Untreated but recognized Will need to be followed up at some point in the future. DC beta-blocker.  Clinical follow-up in 1 year.  Consider repeat echocardiogram to reassess aortic valve.  Also will reassess aorta.  Consider a neurology consult because of "pill-rolling" index and thumb tremor.  I have asked him to discuss this with Dr. Nehemiah Settle.   Medication Adjustments/Labs and Tests Ordered: Current medicines are reviewed at length with the patient today.  Concerns regarding medicines are outlined above.  Orders Placed This Encounter  Procedures   EKG 12-Lead   No orders of the defined types were placed in this encounter.   Patient Instructions  Medication Instructions:  1) Take a half tablet of your Metoprolol for one week and then discontinue.  *If you need a refill on your cardiac medications before your next appointment, please call your pharmacy*   Lab Work: None If you have labs (blood work) drawn today and your tests are completely normal, you will receive your results only by: MyChart Message (if you have MyChart) OR A paper copy in the mail If you have any lab test that is abnormal or we need to change your treatment, we will call you to review the results.   Testing/Procedures: None   Follow-Up: At [facility], you and your health needs are our priority.  As part of our continuing mission to provide you with exceptional heart care, we have created designated Provider Care Teams.  These Care Teams include your primary Cardiologist (physician) and Advanced Practice Providers (APPs -  Physician Assistants and Nurse Practitioners) who all work together to provide you with the  care you need, when you need it.  We recommend signing up for the patient portal called "MyChart".  Sign up  information is provided on this After Visit Summary.  MyChart is used to connect with patients for Virtual Visits (Telemedicine).  Patients are able to view lab/test results, encounter notes, upcoming appointments, etc.  Non-urgent messages can be sent to your provider as well.   To learn more about what you can do with MyChart, go to [email or web address].    Your next appointment:   1 year(s)  The format for your next appointment:   In Person  Provider:   You may see Lesleigh NoeHenry W Faust Thorington III, MD or one of the following Advanced Practice Providers on your designated Care Team:   Nada BoozerLaura Ingold, NP    Other Instructions     Signed, Lesleigh NoeHenry W Jalexa Pifer III, MD  01/19/2021 9:41 AM    [facility]

## 2021-01-19 ENCOUNTER — Encounter: Payer: Self-pay | Admitting: Interventional Cardiology

## 2021-01-19 ENCOUNTER — Ambulatory Visit (INDEPENDENT_AMBULATORY_CARE_PROVIDER_SITE_OTHER): Payer: Medicare Other | Admitting: Interventional Cardiology

## 2021-01-19 ENCOUNTER — Other Ambulatory Visit: Payer: Self-pay

## 2021-01-19 VITALS — BP 102/78 | HR 65 | Ht 71.25 in | Wt 288.6 lb

## 2021-01-19 DIAGNOSIS — I440 Atrioventricular block, first degree: Secondary | ICD-10-CM

## 2021-01-19 DIAGNOSIS — E1159 Type 2 diabetes mellitus with other circulatory complications: Secondary | ICD-10-CM | POA: Diagnosis not present

## 2021-01-19 DIAGNOSIS — E7841 Elevated Lipoprotein(a): Secondary | ICD-10-CM

## 2021-01-19 DIAGNOSIS — I25118 Atherosclerotic heart disease of native coronary artery with other forms of angina pectoris: Secondary | ICD-10-CM

## 2021-01-19 DIAGNOSIS — I77810 Thoracic aortic ectasia: Secondary | ICD-10-CM

## 2021-01-19 DIAGNOSIS — I10 Essential (primary) hypertension: Secondary | ICD-10-CM

## 2021-01-19 DIAGNOSIS — R011 Cardiac murmur, unspecified: Secondary | ICD-10-CM

## 2021-01-19 DIAGNOSIS — Z794 Long term (current) use of insulin: Secondary | ICD-10-CM

## 2021-01-19 NOTE — Patient Instructions (Addendum)
Medication Instructions:  1) Take a half tablet of your Metoprolol for one week and then discontinue.  *If you need a refill on your cardiac medications before your next appointment, please call your pharmacy*   Lab Work: None If you have labs (blood work) drawn today and your tests are completely normal, you will receive your results only by: MyChart Message (if you have MyChart) OR A paper copy in the mail If you have any lab test that is abnormal or we need to change your treatment, we will call you to review the results.   Testing/Procedures: Your physician has requested that you have an echocardiogram. Echocardiography is a painless test that uses sound waves to create images of your heart. It provides your doctor with information about the size and shape of your heart and how well your heart's chambers and valves are working. This procedure takes approximately one hour. There are no restrictions for this procedure.   Follow-Up: At [facility], you and your health needs are our priority.  As part of our continuing mission to provide you with exceptional heart care, we have created designated Provider Care Teams.  These Care Teams include your primary Cardiologist (physician) and Advanced Practice Providers (APPs -  Physician Assistants and Nurse Practitioners) who all work together to provide you with the care you need, when you need it.  We recommend signing up for the patient portal called "MyChart".  Sign up information is provided on this After Visit Summary.  MyChart is used to connect with patients for Virtual Visits (Telemedicine).  Patients are able to view lab/test results, encounter notes, upcoming appointments, etc.  Non-urgent messages can be sent to your provider as well.   To learn more about what you can do with MyChart, go to [email or web address].    Your next appointment:   1 year(s)  The format for your next appointment:   In Person  Provider:   You may see  Lesleigh Noe, MD or one of the following Advanced Practice Providers on your designated Care Team:   Nada Boozer, NP    Other Instructions

## 2021-02-06 ENCOUNTER — Other Ambulatory Visit: Payer: Self-pay

## 2021-02-06 ENCOUNTER — Ambulatory Visit (HOSPITAL_COMMUNITY): Payer: Medicare Other | Attending: Internal Medicine

## 2021-02-06 DIAGNOSIS — I25118 Atherosclerotic heart disease of native coronary artery with other forms of angina pectoris: Secondary | ICD-10-CM | POA: Insufficient documentation

## 2021-02-06 DIAGNOSIS — R011 Cardiac murmur, unspecified: Secondary | ICD-10-CM | POA: Diagnosis present

## 2021-02-06 DIAGNOSIS — I77810 Thoracic aortic ectasia: Secondary | ICD-10-CM | POA: Insufficient documentation

## 2021-02-06 LAB — ECHOCARDIOGRAM COMPLETE
AR max vel: 1.7 cm2
AV Area VTI: 1.76 cm2
AV Area mean vel: 1.61 cm2
AV Mean grad: 13 mmHg
AV Peak grad: 22.3 mmHg
Ao pk vel: 2.36 m/s
Area-P 1/2: 3.65 cm2
S' Lateral: 3.1 cm

## 2021-02-24 ENCOUNTER — Other Ambulatory Visit: Payer: Self-pay | Admitting: Interventional Cardiology

## 2021-04-20 ENCOUNTER — Other Ambulatory Visit: Payer: Self-pay | Admitting: Psychiatry

## 2021-04-20 DIAGNOSIS — F32A Depression, unspecified: Secondary | ICD-10-CM

## 2021-06-19 ENCOUNTER — Other Ambulatory Visit: Payer: Self-pay | Admitting: Psychiatry

## 2021-06-19 DIAGNOSIS — F32A Depression, unspecified: Secondary | ICD-10-CM

## 2021-08-04 ENCOUNTER — Encounter: Payer: Self-pay | Admitting: Psychiatry

## 2021-08-04 ENCOUNTER — Other Ambulatory Visit: Payer: Self-pay

## 2021-08-04 ENCOUNTER — Ambulatory Visit (INDEPENDENT_AMBULATORY_CARE_PROVIDER_SITE_OTHER): Payer: Medicare Other | Admitting: Psychiatry

## 2021-08-04 DIAGNOSIS — F32A Depression, unspecified: Secondary | ICD-10-CM

## 2021-08-04 MED ORDER — FLUOXETINE HCL 20 MG PO CAPS: 60.0000 mg | ORAL_CAPSULE | Freq: Every day | ORAL | 3 refills | Status: DC

## 2021-08-04 NOTE — Progress Notes (Signed)
Valerio Matsui [id number] [birth year] 71 y.o.  Subjective:   Patient ID:  Scott Young is a 71 y.o. (DOB [birth year]) male.  Chief Complaint:  Chief Complaint  Patient presents with   Follow-up    Depression and anxiety    HPI Scott Young presents to the office today for follow-up of depression and anxiety.  "Health-wise I am feeling better than I have felt in years." He reports that his mood has been stable. He reports that he "does not get stressed out like I used to." He reports that he no longer rushing and is taking his time. He reports that he is sleeping well. He reports that there was a brief period where he was seeing people upon awakening and this resolved since starting Trulicity about 4-5 months ago. He reports that his energy has improved. Motivation has been good. He reports adequate concentration. He reports that his appetite has been good. He reports that he has been wanting to eat less on Trulicity and is attempting to lose weight. He reports 10 lbs intentional weight loss. Denies SI.  Has been on a cPap machine since 1994.   He is continuing to pastor full-time and "is enjoying it more than I ever have."  He has been going to PT.   He reports that in the past he has "felt a heaviness in the winter" and did not experience that this year.   Review of Systems:  Review of Systems  Endocrine:       Improved glycemic control with Trulicity  Musculoskeletal:  Negative for gait problem.       Improved knee pain with gel injections and PT  Psychiatric/Behavioral:         Please refer to HPI   Medications: I have reviewed the patient's current medications.  Current Outpatient Medications  Medication Sig Dispense Refill   Ascorbic Acid (VITAMIN C) 1000 MG tablet Take 1,000 mg by mouth daily.     aspirin 81 MG tablet Take 81 mg by mouth at bedtime.      atorvastatin (LIPITOR) 20 MG tablet Take 20 mg by mouth daily.     BORON PO Take by mouth.     clopidogrel (PLAVIX)  75 MG tablet TAKE 1 TABLET BY MOUTH  DAILY WITH BREAKFAST 90 tablet 3   Coenzyme Q10 (COQ10 PO) Take 100 mg by mouth daily.     cyanocobalamin 1000 MCG tablet Take 1,000 mcg by mouth daily.     ezetimibe (ZETIA) 10 MG tablet Take 1 tablet (10 mg total) by mouth daily. 90 tablet 3   fexofenadine (ALLEGRA) 180 MG tablet Take 180 mg by mouth daily.     fluticasone (FLONASE) 50 MCG/ACT nasal spray Place 1 spray into both nostrils daily.     losartan (COZAAR) 25 MG tablet Take 25 mg by mouth daily.     Magnesium 100 MG CAPS Take by mouth.     Multiple Vitamins-Minerals (ZINC PO) Take by mouth.     pyridOXINE (VITAMIN B-6) 100 MG tablet Take 100 mg by mouth daily.     Selenium 200 MCG CAPS Take 1 capsule by mouth daily.     Thiamine HCl (VITAMIN B-1 PO) Take by mouth.     triamcinolone cream (KENALOG) 0.1 % Apply 1 application topically daily as needed (for irritation).      TRULICITY 0.75 MG/0.5ML SOPN SMARTSIG:1 SUB-Q Once a Week     VITAMIN D PO Take by mouth.     FLUoxetine (PROZAC) 20 MG  capsule Take 3 capsules (60 mg total) by mouth daily. 270 capsule 3   Misc Natural Products (PROSTATE SUPPORT PO) Take by mouth. (Patient not taking: Reported on 08/04/2021)     nitroGLYCERIN (NITROSTAT) 0.4 MG SL tablet Place 1 tablet (0.4 mg total) under the tongue every 5 (five) minutes as needed for chest pain. 30 tablet 1   ONETOUCH VERIO test strip 1 each daily.     No current facility-administered medications for this visit.    Medication Side Effects: None  Allergies:  Allergies  Allergen Reactions   Codeine Nausea And Vomiting   Metformin And Related Diarrhea and Nausea And Vomiting    Past Medical History:  Diagnosis Date   Depression    H/O seasonal allergies    tx. OTC meds   Hyperlipidemia    Hypertension    Prostatitis    Sleep apnea    cpap   Umbilical hernia    resolved -s/p surgical repair    Past Medical History, Surgical history, Social history, and Family history were  reviewed and updated as appropriate.   Please see review of systems for further details on the patient's review from today.   Objective:   Physical Exam:  There were no vitals taken for this visit.  Physical Exam Constitutional:      General: He is not in acute distress. Musculoskeletal:        General: No deformity.  Neurological:     Mental Status: He is alert and oriented to person, place, and time.     Coordination: Coordination normal.  Psychiatric:        Attention and Perception: Attention and perception normal. He does not perceive auditory or visual hallucinations.        Mood and Affect: Mood normal. Mood is not anxious or depressed. Affect is not labile, blunt, angry or inappropriate.        Speech: Speech normal.        Behavior: Behavior normal.        Thought Content: Thought content normal. Thought content is not paranoid or delusional. Thought content does not include homicidal or suicidal ideation. Thought content does not include homicidal or suicidal plan.        Cognition and Memory: Cognition and memory normal.        Judgment: Judgment normal.     Comments: Insight intact    Lab Review:     Component Value Date/Time   NA 137 07/05/2018 0525   K 4.0 07/05/2018 0525   CL 107 07/05/2018 0525   CO2 22 07/05/2018 0525   GLUCOSE 115 (H) 07/05/2018 0525   BUN 15 07/05/2018 0525   CREATININE 1.23 07/05/2018 0525   CREATININE 1.20 12/06/2011 1133   CALCIUM 8.6 (L) 07/05/2018 0525   PROT 6.9 07/03/2018 1649   ALBUMIN 4.0 07/03/2018 1649   AST 34 07/03/2018 1649   ALT 24 07/03/2018 1649   ALKPHOS 57 07/03/2018 1649   BILITOT 0.7 07/03/2018 1649   GFRNONAA 60 (L) 07/05/2018 0525   GFRAA >60 07/05/2018 0525       Component Value Date/Time   WBC 6.1 07/05/2018 0525   RBC 4.38 07/05/2018 0525   HGB 13.1 07/05/2018 0525   HCT 40.3 07/05/2018 0525   PLT 169 07/05/2018 0525   MCV 92.0 07/05/2018 0525   MCH 29.9 07/05/2018 0525   MCHC 32.5 07/05/2018 0525    RDW 13.0 07/05/2018 0525   LYMPHSABS 2.0 07/05/2018 0525   MONOABS 0.4 07/05/2018  0525   EOSABS 0.3 07/05/2018 0525   BASOSABS 0.0 07/05/2018 0525    No results found for: POCLITH, LITHIUM   No results found for: PHENYTOIN, PHENOBARB, VALPROATE, CBMZ   .res Assessment: Plan:    Pt seen for 30 minutes and time spent discussing response to Prozac and his experiences at different doses over time. Also discussed the effect fluctuations in glucose levels may have had on his mood and energy in the past.  Will continue current plan of care since target signs and symptoms are well controlled without any tolerability issues. Continue Prozac 60 mg po qd for depression and anxiety.  Pt to follow-up in one year or sooner if clinically indicated.  Patient advised to contact office with any questions, adverse effects, or acute worsening in signs and symptoms.   Aeron was seen today for follow-up.  Diagnoses and all orders for this visit:  Depression, unspecified depression type -     FLUoxetine (PROZAC) 20 MG capsule; Take 3 capsules (60 mg total) by mouth daily.     Please see After Visit Summary for patient specific instructions.  Future Appointments  Date Time Provider Department Center  08/03/2022 10:30 AM Corie Chiquito, PMHNP [facility] None    No orders of the defined types were placed in this encounter.   -------------------------------

## 2021-10-27 ENCOUNTER — Other Ambulatory Visit: Payer: Self-pay

## 2021-10-27 MED ORDER — NITROGLYCERIN 0.4 MG SL SUBL: 0.4000 mg | SUBLINGUAL_TABLET | SUBLINGUAL | 1 refills | Status: DC | PRN

## 2021-11-03 ENCOUNTER — Other Ambulatory Visit: Payer: Self-pay | Admitting: Interventional Cardiology

## 2022-01-10 ENCOUNTER — Other Ambulatory Visit: Payer: Self-pay | Admitting: Interventional Cardiology

## 2022-01-11 NOTE — Telephone Encounter (Signed)
clopidogrel (PLAVIX) 75 MG tablet Medication Date: 11/04/2021 Department: [facility] [place] Office Ordering/Authorizing: Lyn Records, MD   Order Providers  Prescribing Provider Encounter Provider  Lyn Records, MD Lyn Records, MD   Outpatient Medication Detail   Disp Refills Start End   clopidogrel (PLAVIX) 75 MG tablet 90 tablet 0 11/04/2021    Sig: TAKE 1 TABLET BY MOUTH  DAILY WITH BREAKFAST   Sent to pharmacy as: clopidogrel (PLAVIX) 75 MG tablet   Notes to Pharmacy: Please call our office to schedule an yearly appointment with Dr. Katrinka Blazing for August 2023 before anymore refills. [phone]. Thank you 1st attempt   E-Prescribing Status: Receipt confirmed by pharmacy (11/04/2021 11:04 AM EDT)    Pharmacy  [facility] - [place], [place] - [address]   Additional Information  Associated Reports  View Encounter  Priority and Order Details

## 2022-03-05 ENCOUNTER — Other Ambulatory Visit: Payer: Self-pay | Admitting: Interventional Cardiology

## 2022-03-08 ENCOUNTER — Other Ambulatory Visit: Payer: Self-pay | Admitting: Interventional Cardiology

## 2022-03-18 ENCOUNTER — Telehealth: Payer: Self-pay | Admitting: Interventional Cardiology

## 2022-03-18 ENCOUNTER — Encounter: Payer: Self-pay | Admitting: Interventional Cardiology

## 2022-03-18 ENCOUNTER — Ambulatory Visit: Payer: Medicare Other | Attending: Interventional Cardiology | Admitting: Interventional Cardiology

## 2022-03-18 VITALS — BP 108/74 | HR 75 | Ht 71.25 in | Wt 277.0 lb

## 2022-03-18 DIAGNOSIS — I25118 Atherosclerotic heart disease of native coronary artery with other forms of angina pectoris: Secondary | ICD-10-CM | POA: Diagnosis not present

## 2022-03-18 DIAGNOSIS — I10 Essential (primary) hypertension: Secondary | ICD-10-CM | POA: Diagnosis not present

## 2022-03-18 DIAGNOSIS — Z01812 Encounter for preprocedural laboratory examination: Secondary | ICD-10-CM

## 2022-03-18 DIAGNOSIS — E7841 Elevated Lipoprotein(a): Secondary | ICD-10-CM | POA: Diagnosis not present

## 2022-03-18 DIAGNOSIS — E1159 Type 2 diabetes mellitus with other circulatory complications: Secondary | ICD-10-CM | POA: Diagnosis not present

## 2022-03-18 DIAGNOSIS — Z794 Long term (current) use of insulin: Secondary | ICD-10-CM

## 2022-03-18 DIAGNOSIS — I77810 Thoracic aortic ectasia: Secondary | ICD-10-CM

## 2022-03-18 LAB — BASIC METABOLIC PANEL
BUN/Creatinine Ratio: 15 (ref 10–24)
BUN: 18 mg/dL (ref 8–27)
CO2: 30 mmol/L — ABNORMAL HIGH (ref 20–29)
Calcium: 9.8 mg/dL (ref 8.6–10.2)
Chloride: 101 mmol/L (ref 96–106)
Creatinine, Ser: 1.21 mg/dL (ref 0.76–1.27)
Glucose: 88 mg/dL (ref 70–99)
Potassium: 4.5 mmol/L (ref 3.5–5.2)
Sodium: 137 mmol/L (ref 134–144)
eGFR: 64 mL/min/{1.73_m2} (ref >59)

## 2022-03-18 LAB — CBC
Hematocrit: 41.9 % (ref 37.5–51.0)
Hemoglobin: 14.2 g/dL (ref 13.0–17.7)
MCH: 31.2 pg (ref 26.6–33.0)
MCHC: 33.9 g/dL (ref 31.5–35.7)
MCV: 92 fL (ref 79–97)
Platelets: 196 10*3/uL (ref 150–450)
RBC: 4.55 x10E6/uL (ref 4.14–5.80)
RDW: 13.4 % (ref 11.6–15.4)
WBC: 7.6 10*3/uL (ref 3.4–10.8)

## 2022-03-18 NOTE — Patient Instructions (Signed)
Medication Instructions:  Your physician recommends that you continue on your current medications as directed. Please refer to the Current Medication list given to you today.  *If you need a refill on your cardiac medications before your next appointment, please call your pharmacy*  Lab Work: TODAY: CBC, BMET If you have labs (blood work) drawn today and your tests are completely normal, you will receive your results only by: [place] (if you have MyChart) OR A paper copy in the mail If you have any lab test that is abnormal or we need to change your treatment, we will call you to review the results.  Testing/Procedures: Your physician has requested you have a left heart catheterization performed for chest pressure. Please see instructions below.  Follow-Up: At [facility], you and your health needs are our priority.  As part of our continuing mission to provide you with exceptional heart care, we have created designated Provider Care Teams.  These Care Teams include your primary Cardiologist (physician) and Advanced Practice Providers (APPs -  Physician Assistants and Nurse Practitioners) who all work together to provide you with the care you need, when you need it.  Your next appointment:   2-4 week(s) after heart catheterization  The format for your next appointment:   In Person  Provider:   Sinclair Grooms, MD  or Robbie Lis, PA-C, Christen Bame, NP, or Richardson Dopp, PA-C    Other Instructions       Cardiac/Peripheral Catheterization   You are scheduled for a Cardiac Catheterization on Tuesday, October 10 with Dr. Daneen Schick.  1. Please arrive at the Main Entrance A at [facility]: [place], [place] [id number] on October 10 at 8:30 AM (This time is two hours before your procedure to ensure your preparation). Free valet parking service is available. You will check in at ADMITTING. The support person will be asked to wait in the  waiting room.  It is OK to have someone drop you off and come back when you are ready to be discharged.        Special note: Every effort is made to have your procedure done on time. Please understand that emergencies sometimes delay scheduled procedures.  2. Diet: Do not eat solid foods after midnight.  You may have clear liquids until 5 AM the day of the procedure.  3. Medication instructions in preparation for your procedure:   Contrast Allergy: No  On the morning of your procedure, take Aspirin 81 mg and Plavix/Clopidogrel and any morning medicines NOT listed above.  You may use sips of water.  4. Plan to go home the same day, you will only stay overnight if medically necessary. 5. You MUST have a responsible adult to drive you home. 6. An adult MUST be with you the first 24 hours after you arrive home. 7. Bring a current list of your medications, and the last time and date medication taken. 8. Bring ID and current insurance cards. 9.Please wear clothes that are easy to get on and off and wear slip-on shoes.  Thank you for allowing [place] to care for you!   -- [facility]   Important Information About Sugar

## 2022-03-18 NOTE — Telephone Encounter (Signed)
Patient has some questions about his upcoming procedure next week.  She is wondering if there is something sooner.  She also has questions about him working.

## 2022-03-18 NOTE — H&P (View-Only) (Signed)
Cardiology Office Note:    Date:  03/18/2022   ID:  Scott Young, DOB 1951/06/07, MRN [id number]  PCP:  Renford Dills, MD  Cardiologist:  Lesleigh Noe, MD   Referring MD: Renford Dills, MD   Chief Complaint  Patient presents with   Coronary Artery Disease   Hypertension   Shortness of Breath    History of Present Illness:    Scott Young is a 71 y.o. male with a hx of hypertension, hyperlipidemia and sleep apnea, recurring CP, non obstructive coronaries on cardiac catheterization in 07/04/2018.  Aortic diameter by echo 40 mm 2020.  He has had chest tightness off and on over the years.  Heart catheterization was performed about 3-1/2 years ago and showed generalized nonobstructive atherosclerosis in the LAD in particular but also involving the circumflex and right coronary.  Within the past several months he has noticed increased dyspnea on exertion.  Within the past 2 to 4 weeks he has had recurring episodes of tightness in his chest.  Within the past week he had a prolonged episode of chest pressure that required 3 nitroglycerin tablets to resolve.  EKG today reveals more well-formed inferior Q waves than on prior tracings.  He is not having any chest discomfort today.  He states that after the prolonged episode that required the nitroglycerin, he has not had as much discomfort.  Past Medical History:  Diagnosis Date   Depression    H/O seasonal allergies    tx. OTC meds   Hyperlipidemia    Hypertension    Prostatitis    Sleep apnea    cpap   Umbilical hernia    resolved -s/p surgical repair    Past Surgical History:  Procedure Laterality Date   APPENDECTOMY     CATARACT EXTRACTION Left    30 yrs ago   COLONOSCOPY      multiple- x1 colon polyp removed   COLONOSCOPY WITH PROPOFOL N/A 10/22/2014   Procedure: COLONOSCOPY WITH PROPOFOL;  Surgeon: Charolett Bumpers, MD;  Location: [facility];  Service: Endoscopy;  Laterality: N/A;   EYE SURGERY     choriziom removed  right eye 12'15   HERNIA REPAIR     umbilical hernia repair   LEFT HEART CATH AND CORONARY ANGIOGRAPHY N/A 07/04/2018   Procedure: LEFT HEART CATH AND CORONARY ANGIOGRAPHY;  Surgeon: Lyn Records, MD;  Location: [facility];  Service: Cardiovascular;  Laterality: N/A;   TONSILLECTOMY      Current Medications: Current Meds  Medication Sig   Ascorbic Acid (VITAMIN C) 1000 MG tablet Take 1,000 mg by mouth daily.   aspirin 81 MG tablet Take 81 mg by mouth at bedtime.    atorvastatin (LIPITOR) 20 MG tablet Take 20 mg by mouth daily.   clopidogrel (PLAVIX) 75 MG tablet TAKE 1 TABLET BY MOUTH DAILY  WITH BREAKFAST   Coenzyme Q10 (COQ10 PO) Take 100 mg by mouth daily.   cyanocobalamin 1000 MCG tablet Take 1,000 mcg by mouth daily.   ezetimibe (ZETIA) 10 MG tablet Take 1 tablet (10 mg total) by mouth daily.   fexofenadine (ALLEGRA) 180 MG tablet Take 180 mg by mouth daily.   FLUoxetine HCl 60 MG TABS Take 1 tablet by mouth daily at 6 (six) AM.   fluticasone (FLONASE) 50 MCG/ACT nasal spray Place 1 spray into both nostrils daily.   losartan (COZAAR) 25 MG tablet Take 25 mg by mouth daily.   Magnesium 100 MG CAPS Take by mouth.  Misc Natural Products (PROSTATE SUPPORT PO) Take by mouth.   Multiple Vitamins-Minerals (ZINC PO) Take by mouth.   nitroGLYCERIN (NITROSTAT) 0.4 MG SL tablet Place 1 tablet (0.4 mg total) under the tongue every 5 (five) minutes as needed for chest pain.   ONETOUCH VERIO test strip 1 each daily.   pyridOXINE (VITAMIN B-6) 100 MG tablet Take 100 mg by mouth daily.   Selenium 200 MCG CAPS Take 1 capsule by mouth daily.   Thiamine HCl (VITAMIN B-1 PO) Take by mouth.   triamcinolone cream (KENALOG) 0.1 % Apply 1 application topically daily as needed (for irritation).    TRULICITY 0.75 MG/0.5ML SOPN SMARTSIG:1 SUB-Q Once a Week   VITAMIN D PO Take by mouth.   [DISCONTINUED] FLUoxetine (PROZAC) 20 MG capsule Take 3 capsules (60 mg total) by mouth daily.      Allergies:   Codeine, Atorvastatin, and Metformin and related   Social History   Socioeconomic History   Marital status: Married    Spouse name: Not on file   Number of children: Not on file   Years of education: Not on file   Highest education level: Not on file  Occupational History   Not on file  Tobacco Use   Smoking status: Former   Smokeless tobacco: Former    Quit date: 09/23/1970  Vaping Use   Vaping Use: Never used  Substance and Sexual Activity   Alcohol use: No   Drug use: No   Sexual activity: Not on file  Other Topics Concern   Not on file  Social History Narrative   Not on file   Social Determinants of Health   Financial Resource Strain: Not on file  Food Insecurity: Not on file  Transportation Needs: Not on file  Physical Activity: Not on file  Stress: Not on file  Social Connections: Not on file     Family History: The patient's family history includes Aneurysm in his brother and father; Cancer in his father; Heart disease in his mother.  ROS:   Please see the history of present illness.    Brain fog has become an increasing issue.  He still walks a mile several times a week, part of which is up an incline.  It causes shortness of breath but no chest pain.  All other systems reviewed and are negative.  EKGs/Labs/Other Studies Reviewed:    The following studies were reviewed today:  2D Doppler echocardiogram 02/06/2021: IMPRESSIONS   1. Left ventricular ejection fraction, by estimation, is 60 to 65%. The  left ventricle has normal function. The left ventricle has no regional  wall motion abnormalities. There is mild left ventricular hypertrophy.  Left ventricular diastolic parameters  were normal.   2. Right ventricular systolic function is normal. The right ventricular  size is normal.   3. The mitral valve is normal in structure. Mild mitral valve  regurgitation.   4. AV is thickened, calcified with mildly restricted motion. Peak and   mean gradients through the valve are 22 and 14 mm Hg respectively.  Compared to previous echo from 2020, mild increase in mean gradient. . The  aortic valve is tricuspid. Aortic valve  regurgitation is not visualized.   5. The inferior vena cava is normal in size with greater than 50%  respiratory variability, suggesting right atrial pressure of 3 mmHg.   Comparison(s): The left ventricular function is unchanged.  Cardiac catheterization 07/04/2018: Coronary Diagrams  Diagnostic Dominance: Right  Conclusion  Widely patent left main.  Calcification is noted on cine fluoroscopy. Luminal irregularities in the LAD proximal to mid.  Calcification is noted in the proximal vessel on cine fluoroscopy.  Mid to distal LAD up to 40 % generalized narrowing.  The third diagonal contains eccentric 60 to 70% ostial narrowing. Circumflex gives origin to 3 obtuse marginal branches.  The larger branch, OM 3 contains 30% generalized proximal to mid narrowing. Right coronary artery contains luminal irregularities up to 30% proximal to distal. Upper normal LVEDP at 18 mmHg.  EKG:  EKG inferior Q waves involving 3 and aVF, new compared to prior study.  PR interval is 280 ms.  Recent Labs: No results found for requested labs within last 365 days.  Recent Lipid Panel    Component Value Date/Time   CHOL 187 07/04/2018 0156   TRIG 97 07/04/2018 0156   HDL 47 07/04/2018 0156   CHOLHDL 4.0 07/04/2018 0156   VLDL 19 07/04/2018 0156   LDLCALC 121 (H) 07/04/2018 0156    Physical Exam:    VS:  BP 108/74   Pulse 75   Ht 5' 11.25" (1.81 m)   Wt 277 lb (125.6 kg)   SpO2 97%   BMI 38.36 kg/m     Wt Readings from Last 3 Encounters:  03/18/22 277 lb (125.6 kg)  01/19/21 288 lb 9.6 oz (130.9 kg)  11/13/19 289 lb 12.8 oz (131.5 kg)     GEN: Morbid obesity. No acute distress HEENT: Normal NECK: No JVD. LYMPHATICS: No lymphadenopathy CARDIAC: Soft right upper sternal and left mid sternal systolic  murmur. RRR no gallop, or edema. VASCULAR:  Normal Pulses. No bruits. RESPIRATORY:  Clear to auscultation without rales, wheezing or rhonchi  ABDOMEN: Soft, non-tender, non-distended, No pulsatile mass, MUSCULOSKELETAL: No deformity  SKIN: Warm and dry NEUROLOGIC:  Alert and oriented x 3 PSYCHIATRIC:  Normal affect   ASSESSMENT:    1. Coronary artery disease of native artery of native heart with stable angina pectoris ([place])   2. Essential hypertension   3. Elevated lipoprotein(a)   4. Controlled type 2 diabetes mellitus with other circulatory complication, with long-term current use of insulin ([place])   5. Ascending aorta dilatation ([facility])    PLAN:    In order of problems listed above:  We may have crescendo angina.  We discussed evaluation.  There has been some concern in the past about the possibility of microvascular angina.  Under the circumstances with prolonged anginal episode and new Q waves, he needs to undergo coronary angiography with possible PCI if there is new obstructive disease.  If not, we will do a full CMD study to have further characterize his coronary disease. Blood pressure is relatively low for age.  Continue low-dose losartan therapy. Did not discuss Depending upon his LVEDP, consideration of Farxiga or Vania Rea may be reasonable to do. Beta-blocker therapy may also need to be considered given atherosclerosis and dilated aorta.   The patient was counseled to undergo left heart catheterization, coronary angiography, and possible percutaneous coronary intervention with stent implantation. The procedural risks and benefits were discussed in detail. The risks discussed included death, stroke, myocardial infarction, life-threatening bleeding, limb ischemia, kidney injury, allergy, and possible emergency cardiac surgery. The risk of these significant complications were estimated to occur less than 1% of the time. After discussion, the patient has agreed to  proceed.    Medication Adjustments/Labs and Tests Ordered: Current medicines are reviewed at length with the patient today.  Concerns regarding medicines are outlined above.  No  orders of the defined types were placed in this encounter.  No orders of the defined types were placed in this encounter.   There are no Patient Instructions on file for this visit.   Signed, Lesleigh Noe, MD  03/18/2022 9:17 AM    [facility]

## 2022-03-18 NOTE — Progress Notes (Signed)
Cardiology Office Note:    Date:  03/18/2022   ID:  Scott Young, DOB 1951/06/07, MRN [id number]  PCP:  Renford Dills, MD  Cardiologist:  Lesleigh Noe, MD   Referring MD: Renford Dills, MD   Chief Complaint  Patient presents with   Coronary Artery Disease   Hypertension   Shortness of Breath    History of Present Illness:    Scott Young is a 71 y.o. male with a hx of hypertension, hyperlipidemia and sleep apnea, recurring CP, non obstructive coronaries on cardiac catheterization in 07/04/2018.  Aortic diameter by echo 40 mm 2020.  He has had chest tightness off and on over the years.  Heart catheterization was performed about 3-1/2 years ago and showed generalized nonobstructive atherosclerosis in the LAD in particular but also involving the circumflex and right coronary.  Within the past several months he has noticed increased dyspnea on exertion.  Within the past 2 to 4 weeks he has had recurring episodes of tightness in his chest.  Within the past week he had a prolonged episode of chest pressure that required 3 nitroglycerin tablets to resolve.  EKG today reveals more well-formed inferior Q waves than on prior tracings.  He is not having any chest discomfort today.  He states that after the prolonged episode that required the nitroglycerin, he has not had as much discomfort.  Past Medical History:  Diagnosis Date   Depression    H/O seasonal allergies    tx. OTC meds   Hyperlipidemia    Hypertension    Prostatitis    Sleep apnea    cpap   Umbilical hernia    resolved -s/p surgical repair    Past Surgical History:  Procedure Laterality Date   APPENDECTOMY     CATARACT EXTRACTION Left    30 yrs ago   COLONOSCOPY      multiple- x1 colon polyp removed   COLONOSCOPY WITH PROPOFOL N/A 10/22/2014   Procedure: COLONOSCOPY WITH PROPOFOL;  Surgeon: Charolett Bumpers, MD;  Location: [facility];  Service: Endoscopy;  Laterality: N/A;   EYE SURGERY     choriziom removed  right eye 12'15   HERNIA REPAIR     umbilical hernia repair   LEFT HEART CATH AND CORONARY ANGIOGRAPHY N/A 07/04/2018   Procedure: LEFT HEART CATH AND CORONARY ANGIOGRAPHY;  Surgeon: Lyn Records, MD;  Location: [facility];  Service: Cardiovascular;  Laterality: N/A;   TONSILLECTOMY      Current Medications: Current Meds  Medication Sig   Ascorbic Acid (VITAMIN C) 1000 MG tablet Take 1,000 mg by mouth daily.   aspirin 81 MG tablet Take 81 mg by mouth at bedtime.    atorvastatin (LIPITOR) 20 MG tablet Take 20 mg by mouth daily.   clopidogrel (PLAVIX) 75 MG tablet TAKE 1 TABLET BY MOUTH DAILY  WITH BREAKFAST   Coenzyme Q10 (COQ10 PO) Take 100 mg by mouth daily.   cyanocobalamin 1000 MCG tablet Take 1,000 mcg by mouth daily.   ezetimibe (ZETIA) 10 MG tablet Take 1 tablet (10 mg total) by mouth daily.   fexofenadine (ALLEGRA) 180 MG tablet Take 180 mg by mouth daily.   FLUoxetine HCl 60 MG TABS Take 1 tablet by mouth daily at 6 (six) AM.   fluticasone (FLONASE) 50 MCG/ACT nasal spray Place 1 spray into both nostrils daily.   losartan (COZAAR) 25 MG tablet Take 25 mg by mouth daily.   Magnesium 100 MG CAPS Take by mouth.  Misc Natural Products (PROSTATE SUPPORT PO) Take by mouth.   Multiple Vitamins-Minerals (ZINC PO) Take by mouth.   nitroGLYCERIN (NITROSTAT) 0.4 MG SL tablet Place 1 tablet (0.4 mg total) under the tongue every 5 (five) minutes as needed for chest pain.   ONETOUCH VERIO test strip 1 each daily.   pyridOXINE (VITAMIN B-6) 100 MG tablet Take 100 mg by mouth daily.   Selenium 200 MCG CAPS Take 1 capsule by mouth daily.   Thiamine HCl (VITAMIN B-1 PO) Take by mouth.   triamcinolone cream (KENALOG) 0.1 % Apply 1 application topically daily as needed (for irritation).    TRULICITY 0.75 MG/0.5ML SOPN SMARTSIG:1 SUB-Q Once a Week   VITAMIN D PO Take by mouth.   [DISCONTINUED] FLUoxetine (PROZAC) 20 MG capsule Take 3 capsules (60 mg total) by mouth daily.      Allergies:   Codeine, Atorvastatin, and Metformin and related   Social History   Socioeconomic History   Marital status: Married    Spouse name: Not on file   Number of children: Not on file   Years of education: Not on file   Highest education level: Not on file  Occupational History   Not on file  Tobacco Use   Smoking status: Former   Smokeless tobacco: Former    Quit date: 09/23/1970  Vaping Use   Vaping Use: Never used  Substance and Sexual Activity   Alcohol use: No   Drug use: No   Sexual activity: Not on file  Other Topics Concern   Not on file  Social History Narrative   Not on file   Social Determinants of Health   Financial Resource Strain: Not on file  Food Insecurity: Not on file  Transportation Needs: Not on file  Physical Activity: Not on file  Stress: Not on file  Social Connections: Not on file     Family History: The patient's family history includes Aneurysm in his brother and father; Cancer in his father; Heart disease in his mother.  ROS:   Please see the history of present illness.    Brain fog has become an increasing issue.  He still walks a mile several times a week, part of which is up an incline.  It causes shortness of breath but no chest pain.  All other systems reviewed and are negative.  EKGs/Labs/Other Studies Reviewed:    The following studies were reviewed today:  2D Doppler echocardiogram 02/06/2021: IMPRESSIONS   1. Left ventricular ejection fraction, by estimation, is 60 to 65%. The  left ventricle has normal function. The left ventricle has no regional  wall motion abnormalities. There is mild left ventricular hypertrophy.  Left ventricular diastolic parameters  were normal.   2. Right ventricular systolic function is normal. The right ventricular  size is normal.   3. The mitral valve is normal in structure. Mild mitral valve  regurgitation.   4. AV is thickened, calcified with mildly restricted motion. Peak and   mean gradients through the valve are 22 and 14 mm Hg respectively.  Compared to previous echo from 2020, mild increase in mean gradient. . The  aortic valve is tricuspid. Aortic valve  regurgitation is not visualized.   5. The inferior vena cava is normal in size with greater than 50%  respiratory variability, suggesting right atrial pressure of 3 mmHg.   Comparison(s): The left ventricular function is unchanged.  Cardiac catheterization 07/04/2018: Coronary Diagrams  Diagnostic Dominance: Right  Conclusion  Widely patent left main.  Calcification is noted on cine fluoroscopy. Luminal irregularities in the LAD proximal to mid.  Calcification is noted in the proximal vessel on cine fluoroscopy.  Mid to distal LAD up to 40 % generalized narrowing.  The third diagonal contains eccentric 60 to 70% ostial narrowing. Circumflex gives origin to 3 obtuse marginal branches.  The larger branch, OM 3 contains 30% generalized proximal to mid narrowing. Right coronary artery contains luminal irregularities up to 30% proximal to distal. Upper normal LVEDP at 18 mmHg.  EKG:  EKG inferior Q waves involving 3 and aVF, new compared to prior study.  PR interval is 280 ms.  Recent Labs: No results found for requested labs within last 365 days.  Recent Lipid Panel    Component Value Date/Time   CHOL 187 07/04/2018 0156   TRIG 97 07/04/2018 0156   HDL 47 07/04/2018 0156   CHOLHDL 4.0 07/04/2018 0156   VLDL 19 07/04/2018 0156   LDLCALC 121 (H) 07/04/2018 0156    Physical Exam:    VS:  BP 108/74   Pulse 75   Ht 5' 11.25" (1.81 m)   Wt 277 lb (125.6 kg)   SpO2 97%   BMI 38.36 kg/m     Wt Readings from Last 3 Encounters:  03/18/22 277 lb (125.6 kg)  01/19/21 288 lb 9.6 oz (130.9 kg)  11/13/19 289 lb 12.8 oz (131.5 kg)     GEN: Morbid obesity. No acute distress HEENT: Normal NECK: No JVD. LYMPHATICS: No lymphadenopathy CARDIAC: Soft right upper sternal and left mid sternal systolic  murmur. RRR no gallop, or edema. VASCULAR:  Normal Pulses. No bruits. RESPIRATORY:  Clear to auscultation without rales, wheezing or rhonchi  ABDOMEN: Soft, non-tender, non-distended, No pulsatile mass, MUSCULOSKELETAL: No deformity  SKIN: Warm and dry NEUROLOGIC:  Alert and oriented x 3 PSYCHIATRIC:  Normal affect   ASSESSMENT:    1. Coronary artery disease of native artery of native heart with stable angina pectoris ([place])   2. Essential hypertension   3. Elevated lipoprotein(a)   4. Controlled type 2 diabetes mellitus with other circulatory complication, with long-term current use of insulin ([place])   5. Ascending aorta dilatation ([facility])    PLAN:    In order of problems listed above:  We may have crescendo angina.  We discussed evaluation.  There has been some concern in the past about the possibility of microvascular angina.  Under the circumstances with prolonged anginal episode and new Q waves, he needs to undergo coronary angiography with possible PCI if there is new obstructive disease.  If not, we will do a full CMD study to have further characterize his coronary disease. Blood pressure is relatively low for age.  Continue low-dose losartan therapy. Did not discuss Depending upon his LVEDP, consideration of Farxiga or Vania Rea may be reasonable to do. Beta-blocker therapy may also need to be considered given atherosclerosis and dilated aorta.   The patient was counseled to undergo left heart catheterization, coronary angiography, and possible percutaneous coronary intervention with stent implantation. The procedural risks and benefits were discussed in detail. The risks discussed included death, stroke, myocardial infarction, life-threatening bleeding, limb ischemia, kidney injury, allergy, and possible emergency cardiac surgery. The risk of these significant complications were estimated to occur less than 1% of the time. After discussion, the patient has agreed to  proceed.    Medication Adjustments/Labs and Tests Ordered: Current medicines are reviewed at length with the patient today.  Concerns regarding medicines are outlined above.  No  orders of the defined types were placed in this encounter.  No orders of the defined types were placed in this encounter.   There are no Patient Instructions on file for this visit.   Signed, Tamiya Colello W Lyza Houseworth III, MD  03/18/2022 9:17 AM    [facility] 

## 2022-03-18 NOTE — Telephone Encounter (Signed)
Returned call to patient's wife [place] ([place] per [facility]).  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  Discussed with Dr. Tamala Julian: OK to continue working and performing regular activities, but if he experiences chest pain/pressure he needs to stop the activity and take his NTG. If chest pain/pressure is not relieved by NTG patient needs to go to ED for evaluation.  [place] verbalized understanding and expressed appreciation for easing her mind.

## 2022-03-22 ENCOUNTER — Telehealth: Payer: Self-pay | Admitting: *Deleted

## 2022-03-22 NOTE — Telephone Encounter (Signed)
Cardiac Catheterization scheduled at [facility] for: Tuesday March 23, 2022 10:30 AM Arrival time and place: [place] Entrance A at: 8:30 AM  Nothing to eat after midnight prior to procedure, clear liquids until 5 AM day of procedure.  Medication instructions: -Usual morning medications can be taken with sips of water including aspirin 81 mg and Plavix 75 mg.  Patient reports he takes Trulicity weekly on Saturdays- last dose 03/20/22.  Confirmed patient has responsible adult to drive home post procedure and be with patient first 24 hours after arriving home.  Patient reports no new symptoms concerning for COVID-19 in the past 10 days.  Reviewed procedure instructions with patient.

## 2022-03-23 ENCOUNTER — Encounter (HOSPITAL_COMMUNITY)
Admission: RE | Disposition: A | Discharge status: Home / Self Care | Payer: Self-pay | Source: Home / Self Care | Attending: Interventional Cardiology

## 2022-03-23 ENCOUNTER — Ambulatory Visit (HOSPITAL_COMMUNITY)
Admission: RE | Admit: 2022-03-23 | Discharge: 2022-03-23 | Disposition: A | Discharge status: Home / Self Care | Payer: Medicare Other | Attending: Interventional Cardiology | Admitting: Interventional Cardiology

## 2022-03-23 DIAGNOSIS — E785 Hyperlipidemia, unspecified: Secondary | ICD-10-CM | POA: Insufficient documentation

## 2022-03-23 DIAGNOSIS — E119 Type 2 diabetes mellitus without complications: Secondary | ICD-10-CM | POA: Diagnosis not present

## 2022-03-23 DIAGNOSIS — G4730 Sleep apnea, unspecified: Secondary | ICD-10-CM | POA: Diagnosis not present

## 2022-03-23 DIAGNOSIS — R9431 Abnormal electrocardiogram [ECG] [EKG]: Secondary | ICD-10-CM | POA: Diagnosis present

## 2022-03-23 DIAGNOSIS — I10 Essential (primary) hypertension: Secondary | ICD-10-CM | POA: Diagnosis present

## 2022-03-23 DIAGNOSIS — Z87891 Personal history of nicotine dependence: Secondary | ICD-10-CM | POA: Diagnosis not present

## 2022-03-23 DIAGNOSIS — I2510 Atherosclerotic heart disease of native coronary artery without angina pectoris: Secondary | ICD-10-CM

## 2022-03-23 DIAGNOSIS — R0602 Shortness of breath: Secondary | ICD-10-CM | POA: Diagnosis not present

## 2022-03-23 DIAGNOSIS — I77819 Aortic ectasia, unspecified site: Secondary | ICD-10-CM | POA: Diagnosis not present

## 2022-03-23 DIAGNOSIS — Z7985 Long-term (current) use of injectable non-insulin antidiabetic drugs: Secondary | ICD-10-CM | POA: Insufficient documentation

## 2022-03-23 DIAGNOSIS — E7841 Elevated Lipoprotein(a): Secondary | ICD-10-CM | POA: Insufficient documentation

## 2022-03-23 DIAGNOSIS — I209 Angina pectoris, unspecified: Secondary | ICD-10-CM | POA: Diagnosis present

## 2022-03-23 DIAGNOSIS — Z79899 Other long term (current) drug therapy: Secondary | ICD-10-CM | POA: Insufficient documentation

## 2022-03-23 DIAGNOSIS — G4733 Obstructive sleep apnea (adult) (pediatric): Secondary | ICD-10-CM | POA: Diagnosis present

## 2022-03-23 DIAGNOSIS — I25118 Atherosclerotic heart disease of native coronary artery with other forms of angina pectoris: Secondary | ICD-10-CM | POA: Insufficient documentation

## 2022-03-23 HISTORY — PX: LEFT HEART CATH AND CORONARY ANGIOGRAPHY: CATH118249

## 2022-03-23 HISTORY — PX: INTRAVASCULAR PRESSURE WIRE/FFR STUDY: CATH118243

## 2022-03-23 LAB — GLUCOSE, CAPILLARY
Glucose-Capillary: 106 mg/dL — ABNORMAL HIGH (ref 70–99)
Glucose-Capillary: 104 mg/dL — ABNORMAL HIGH (ref 70–99)

## 2022-03-23 LAB — POCT ACTIVATED CLOTTING TIME: Activated Clotting Time: 275 seconds

## 2022-03-23 SURGERY — LEFT HEART CATH AND CORONARY ANGIOGRAPHY
Anesthesia: LOCAL

## 2022-03-23 MED ORDER — LABETALOL HCL 5 MG/ML IV SOLN: 10.0000 mg | INTRAVENOUS | Status: DC | PRN

## 2022-03-23 MED ORDER — SODIUM CHLORIDE 0.9% FLUSH: 3.0000 mL | INTRAVENOUS | Status: DC | PRN

## 2022-03-23 MED ORDER — ASPIRIN 81 MG PO CHEW: 81.0000 mg | CHEWABLE_TABLET | ORAL | Status: DC

## 2022-03-23 MED ORDER — IOHEXOL 350 MG/ML SOLN
INTRAVENOUS | Status: DC | PRN
  Administered 2022-03-23: 110 mL

## 2022-03-23 MED ORDER — LIDOCAINE HCL (PF) 1 % IJ SOLN
INTRAMUSCULAR | Status: AC
  Filled 2022-03-23: qty 30

## 2022-03-23 MED ORDER — SODIUM CHLORIDE 0.9% FLUSH: 3.0000 mL | Freq: Two times a day (BID) | INTRAVENOUS | Status: DC

## 2022-03-23 MED ORDER — SODIUM CHLORIDE 0.9 % IV SOLN: 250.0000 mL | INTRAVENOUS | Status: DC | PRN

## 2022-03-23 MED ORDER — HEPARIN SODIUM (PORCINE) 1000 UNIT/ML IJ SOLN
INTRAMUSCULAR | Status: AC
  Filled 2022-03-23: qty 10

## 2022-03-23 MED ORDER — SODIUM CHLORIDE 0.9 % WEIGHT BASED INFUSION
3.0000 mL/kg/h | INTRAVENOUS | Status: AC
  Administered 2022-03-23: 3 mL/kg/h via INTRAVENOUS

## 2022-03-23 MED ORDER — ONDANSETRON HCL 4 MG/2ML IJ SOLN: 4.0000 mg | Freq: Four times a day (QID) | INTRAMUSCULAR | Status: DC | PRN

## 2022-03-23 MED ORDER — HEPARIN (PORCINE) IN NACL 1000-0.9 UT/500ML-% IV SOLN
INTRAVENOUS | Status: AC
  Filled 2022-03-23: qty 1000

## 2022-03-23 MED ORDER — HEPARIN (PORCINE) IN NACL 1000-0.9 UT/500ML-% IV SOLN
INTRAVENOUS | Status: DC | PRN
  Administered 2022-03-23 (×2): 500 mL

## 2022-03-23 MED ORDER — MIDAZOLAM HCL 2 MG/2ML IJ SOLN
INTRAMUSCULAR | Status: AC
  Filled 2022-03-23: qty 2

## 2022-03-23 MED ORDER — LIDOCAINE HCL (PF) 1 % IJ SOLN
INTRAMUSCULAR | Status: DC | PRN
  Administered 2022-03-23: 2 mL

## 2022-03-23 MED ORDER — FENTANYL CITRATE (PF) 100 MCG/2ML IJ SOLN
INTRAMUSCULAR | Status: AC
  Filled 2022-03-23: qty 2

## 2022-03-23 MED ORDER — VERAPAMIL HCL 2.5 MG/ML IV SOLN
INTRAVENOUS | Status: AC
  Filled 2022-03-23: qty 2

## 2022-03-23 MED ORDER — ADENOSINE (DIAGNOSTIC) 140MCG/KG/MIN
INTRAVENOUS | Status: DC | PRN
  Administered 2022-03-23: 140 ug/kg/min via INTRAVENOUS

## 2022-03-23 MED ORDER — HYDRALAZINE HCL 20 MG/ML IJ SOLN: 10.0000 mg | INTRAMUSCULAR | Status: DC | PRN

## 2022-03-23 MED ORDER — VERAPAMIL HCL 2.5 MG/ML IV SOLN
INTRAVENOUS | Status: DC | PRN
  Administered 2022-03-23: 10 mL via INTRA_ARTERIAL

## 2022-03-23 MED ORDER — FENTANYL CITRATE (PF) 100 MCG/2ML IJ SOLN
INTRAMUSCULAR | Status: DC | PRN
  Administered 2022-03-23: 50 ug via INTRAVENOUS

## 2022-03-23 MED ORDER — OXYCODONE HCL 5 MG PO TABS: 5.0000 mg | ORAL_TABLET | ORAL | Status: DC | PRN

## 2022-03-23 MED ORDER — HEPARIN SODIUM (PORCINE) 1000 UNIT/ML IJ SOLN
INTRAMUSCULAR | Status: DC | PRN
  Administered 2022-03-23: 2000 [IU] via INTRAVENOUS
  Administered 2022-03-23: 4000 [IU] via INTRAVENOUS
  Administered 2022-03-23: 6000 [IU] via INTRAVENOUS

## 2022-03-23 MED ORDER — SODIUM CHLORIDE 0.9 % WEIGHT BASED INFUSION: 1.0000 mL/kg/h | INTRAVENOUS | Status: DC

## 2022-03-23 MED ORDER — SODIUM CHLORIDE 0.9 % IV SOLN: INTRAVENOUS | Status: DC

## 2022-03-23 MED ORDER — ACETAMINOPHEN 325 MG PO TABS: 650.0000 mg | ORAL_TABLET | ORAL | Status: DC | PRN

## 2022-03-23 MED ORDER — MIDAZOLAM HCL 2 MG/2ML IJ SOLN
INTRAMUSCULAR | Status: DC | PRN
  Administered 2022-03-23: 1 mg via INTRAVENOUS

## 2022-03-23 SURGICAL SUPPLY — 14 items
BAND CMPR LRG ZPHR (HEMOSTASIS) ×1
BAND ZEPHYR COMPRESS 30 LONG (HEMOSTASIS) IMPLANT
CATH 5FR JL3.5 JR4 ANG PIG MP (CATHETERS) IMPLANT
CATH VISTA GUIDE 6FR XBLAD3.5 (CATHETERS) IMPLANT
GLIDESHEATH SLEND A-KIT 6F 22G (SHEATH) IMPLANT
GUIDEWIRE INQWIRE 1.5J.035X260 (WIRE) IMPLANT
GUIDEWIRE PRESSURE X 175 (WIRE) IMPLANT
INQWIRE 1.5J .035X260CM (WIRE) ×1
KIT ESSENTIALS PG (KITS) IMPLANT
KIT HEART LEFT (KITS) ×1 IMPLANT
PACK CARDIAC CATHETERIZATION (CUSTOM PROCEDURE TRAY) ×1 IMPLANT
SHEATH PROBE COVER 6X72 (BAG) IMPLANT
TRANSDUCER W/STOPCOCK (MISCELLANEOUS) ×1 IMPLANT
TUBING CIL FLEX 10 FLL-RA (TUBING) ×1 IMPLANT

## 2022-03-23 NOTE — CV Procedure (Signed)
Luminal irregularities right coronary, ostial diagonal #2 with 70% stenosis, tandem 30 to 40% stenoses in the mid LAD, generalized mild atherosclerosis in the circumflex and obtuse marginal LV function reveals mild distal anterior wall hypokinesis.  EF 45 to 50%.  LVEDP CMD study demonstrated CFR 1.5, IMR is 20 and maximal hyperemia. IMR at rest 80; IMR maximal hyperemia 20 (noted guide catheter migrated outside the LM -which would adversely impact transit time in throat calculations off)

## 2022-03-23 NOTE — Interval H&P Note (Signed)
Cath Lab Visit (complete for each Cath Lab visit)  Clinical Evaluation Leading to the Procedure:   ACS: No.  Non-ACS:    Anginal Classification: CCS IV  Anti-ischemic medical therapy: Minimal Therapy (1 class of medications)  Non-Invasive Test Results: No non-invasive testing performed  Prior CABG: No previous CABG      History and Physical Interval Note:  03/23/2022 12:01 PM  Scott Young  has presented today for surgery, with the diagnosis of chest pressure.  The various methods of treatment have been discussed with the patient and family. After consideration of risks, benefits and other options for treatment, the patient has consented to  Procedure(s): LEFT HEART CATH AND CORONARY ANGIOGRAPHY (N/A) as a surgical intervention.  The patient's history has been reviewed, patient examined, no change in status, stable for surgery.  I have reviewed the patient's chart and labs.  Questions were answered to the patient's satisfaction.     Belva Crome III

## 2022-03-24 ENCOUNTER — Emergency Department (HOSPITAL_COMMUNITY)
Admission: EM | Admit: 2022-03-24 | Discharge: 2022-03-25 | Disposition: A | Discharge status: Home / Self Care | Payer: Medicare Other | Attending: Emergency Medicine | Admitting: Emergency Medicine

## 2022-03-24 ENCOUNTER — Telehealth: Payer: Self-pay | Admitting: Interventional Cardiology

## 2022-03-24 ENCOUNTER — Other Ambulatory Visit: Payer: Self-pay

## 2022-03-24 ENCOUNTER — Emergency Department (HOSPITAL_COMMUNITY): Payer: Medicare Other

## 2022-03-24 ENCOUNTER — Encounter (HOSPITAL_COMMUNITY): Payer: Self-pay | Admitting: Interventional Cardiology

## 2022-03-24 DIAGNOSIS — E785 Hyperlipidemia, unspecified: Secondary | ICD-10-CM | POA: Diagnosis not present

## 2022-03-24 DIAGNOSIS — Z7982 Long term (current) use of aspirin: Secondary | ICD-10-CM | POA: Insufficient documentation

## 2022-03-24 DIAGNOSIS — I209 Angina pectoris, unspecified: Secondary | ICD-10-CM

## 2022-03-24 DIAGNOSIS — Z79899 Other long term (current) drug therapy: Secondary | ICD-10-CM | POA: Diagnosis not present

## 2022-03-24 DIAGNOSIS — I10 Essential (primary) hypertension: Secondary | ICD-10-CM | POA: Diagnosis not present

## 2022-03-24 DIAGNOSIS — R110 Nausea: Secondary | ICD-10-CM | POA: Diagnosis not present

## 2022-03-24 DIAGNOSIS — R0789 Other chest pain: Secondary | ICD-10-CM | POA: Diagnosis present

## 2022-03-24 DIAGNOSIS — R7989 Other specified abnormal findings of blood chemistry: Secondary | ICD-10-CM

## 2022-03-24 DIAGNOSIS — R079 Chest pain, unspecified: Secondary | ICD-10-CM | POA: Diagnosis not present

## 2022-03-24 LAB — COMPREHENSIVE METABOLIC PANEL
ALT: 24 U/L (ref 0–44)
AST: 33 U/L (ref 15–41)
Albumin: 3.8 g/dL (ref 3.5–5.0)
Alkaline Phosphatase: 95 U/L (ref 38–126)
Anion gap: 11 (ref 5–15)
BUN: 14 mg/dL (ref 8–23)
CO2: 24 mmol/L (ref 22–32)
Calcium: 9 mg/dL (ref 8.9–10.3)
Chloride: 102 mmol/L (ref 98–111)
Creatinine, Ser: 1.19 mg/dL (ref 0.61–1.24)
GFR, Estimated: >60 mL/min (ref >60)
Glucose, Bld: 111 mg/dL — ABNORMAL HIGH (ref 70–99)
Potassium: 4.1 mmol/L (ref 3.5–5.1)
Sodium: 137 mmol/L (ref 135–145)
Total Bilirubin: 0.9 mg/dL (ref 0.3–1.2)
Total Protein: 6.4 g/dL — ABNORMAL LOW (ref 6.5–8.1)

## 2022-03-24 LAB — CBC WITH DIFFERENTIAL/PLATELET
Abs Immature Granulocytes: 0.01 10*3/uL (ref 0.00–0.07)
Basophils Absolute: 0 10*3/uL (ref 0.0–0.1)
Basophils Relative: 1 %
Eosinophils Absolute: 0.2 10*3/uL (ref 0.0–0.5)
Eosinophils Relative: 3 %
HCT: 42.6 % (ref 39.0–52.0)
Hemoglobin: 14.3 g/dL (ref 13.0–17.0)
Immature Granulocytes: 0 %
Lymphocytes Relative: 41 %
Lymphs Abs: 2.8 10*3/uL (ref 0.7–4.0)
MCH: 31.1 pg (ref 26.0–34.0)
MCHC: 33.6 g/dL (ref 30.0–36.0)
MCV: 92.6 fL (ref 80.0–100.0)
Monocytes Absolute: 0.5 10*3/uL (ref 0.1–1.0)
Monocytes Relative: 7 %
Neutro Abs: 3.4 10*3/uL (ref 1.7–7.7)
Neutrophils Relative %: 48 %
Platelets: 206 10*3/uL (ref 150–400)
RBC: 4.6 MIL/uL (ref 4.22–5.81)
RDW: 12.8 % (ref 11.5–15.5)
WBC: 6.9 10*3/uL (ref 4.0–10.5)
nRBC: 0 % (ref 0.0–0.2)

## 2022-03-24 LAB — TROPONIN I (HIGH SENSITIVITY): Troponin I (High Sensitivity): 18 ng/L — ABNORMAL HIGH (ref <18)

## 2022-03-24 NOTE — Telephone Encounter (Signed)
Pt had a heart cath done by Dr. Tamala Julian yesterday.  Cath report revealed microvascular dysfunction with appropriate therapy initiated at that time.   Pt states starting today at around 3:15 pm, he noted generalized weakness, dizziness, and feeling extremely nauseated.  Pt states shortly after that he started to experience  heavy chest pressure that radiated pain up into his neck and jaw area.   He states he took one SL nitro and a baby ASA, and this slightly eased off.  Pt states he is still having chest pressure and pain radiating up into the left side of his neck and jaw area.  He states he is still feeling very weak and nauseated.  He voiced no complaints of sob.  He states he is not monitoring his BP/HR at home , so unclear of his vital signs at this time.  Pt lives in [place].  Pt is seeking advisement on what he should do, and if he should go to the ER for further evaluation.  Advised the pt to call EMS to be transported to a local ER near him, given he lives all the way in [place].   Pt states he does not want to call EMS at this time, or go to a ER in [place].  He states he is going to have his wife drive him to [facility] ER for evaluation.  Pt did say that he will call EMS en route, if symptoms worsen during his ride time.  Urgent 911 precautions provided to both the pt and wife.   Advised him to take all his meds with him to the ER.    Pt aware that I will make Dr. Tamala Julian and his RN aware of this plan, so that they can follow-up with progress tomorrow.  Did reiterate again to him that if his symptoms worsen between now and his commute from [place] to [facility] ER, he must call 911 for this.   Pt verbalized understanding and agrees with this plan.

## 2022-03-24 NOTE — ED Provider Triage Note (Signed)
Emergency Medicine Provider Triage Evaluation Note  Scott Young , a 71 y.o. male  was evaluated in triage.  Pt complains of chest pain.  Patient had heart catheterization yesterday that showed Nonobstructive disease with medical management.  Patient reports that today about 3:00 he had a tight squeezing pressure in his chest that radiated into his jaw.  No shortness of breath.  Patient reports nausea.  No diaphoresis or vomiting.  Patient did take 2 nitro with some relief of symptoms.  Patient reports his pain is a 1/10 at this time.  Review of Systems  Positive: Chest pain Negative: Short of breath, leg swelling  Physical Exam  BP 116/77 (BP Location: Left Arm)   Pulse 64   Temp 97.7 F (36.5 C) (Oral)   Resp 18   SpO2 96%  Gen:   Awake, no distress   Resp:  Normal effort  MSK:   Moves extremities without difficulty  Other:  Heart regular rate and rhythm.  Lungs clear to auscultation bilaterally.  Medical Decision Making  Medically screening exam initiated at 8:22 PM.  Appropriate orders placed.  Scott Young was informed that the remainder of the evaluation will be completed by another provider, this initial triage assessment does not replace that evaluation, and the importance of remaining in the ED until their evaluation is complete.  Patient with chest pain.  Recent heart catheterization yesterday.  Chest pain work-up initiated which is pending at this time.   Gregori, Abril, PA-C 03/24/22 2023

## 2022-03-24 NOTE — ED Triage Notes (Signed)
Pt reported to ED with c/o left sided chest pain that began at approximately 3pm. Pt states he also felt weak and nauseated. Had cardiac catheterization yesterday.

## 2022-03-24 NOTE — Telephone Encounter (Signed)
Pt c/o of Chest Pain: STAT if CP now or developed within 24 hours  1. Are you having CP right now? no  2. Are you experiencing any other symptoms (ex. SOB, nausea, vomiting, sweating)? SOB, fatigue, nausea, burning pain in neck and jaws   3. How long have you been experiencing CP? Start 3:15pm and its starting to pass  4. Is your CP continuous or coming and going? Continuous during that time  5. Have you taken Nitroglycerin? Yes, and it felt better and one 80 mg aspirin  ?

## 2022-03-25 DIAGNOSIS — E785 Hyperlipidemia, unspecified: Secondary | ICD-10-CM

## 2022-03-25 DIAGNOSIS — R079 Chest pain, unspecified: Secondary | ICD-10-CM

## 2022-03-25 DIAGNOSIS — I10 Essential (primary) hypertension: Secondary | ICD-10-CM

## 2022-03-25 LAB — TROPONIN I (HIGH SENSITIVITY): Troponin I (High Sensitivity): 26 ng/L — ABNORMAL HIGH (ref <18)

## 2022-03-25 MED ORDER — ISOSORBIDE MONONITRATE ER 30 MG PO TB24: 30.0000 mg | ORAL_TABLET | Freq: Every day | ORAL | Status: DC

## 2022-03-25 MED ORDER — ISOSORBIDE MONONITRATE ER 60 MG PO TB24: 60.0000 mg | ORAL_TABLET | Freq: Every day | ORAL | 1 refills | Status: DC

## 2022-03-25 MED ORDER — ISOSORBIDE MONONITRATE ER 30 MG PO TB24
60.0000 mg | ORAL_TABLET | Freq: Every day | ORAL | Status: DC
  Administered 2022-03-25: 60 mg via ORAL
  Filled 2022-03-25: qty 2

## 2022-03-25 NOTE — ED Provider Notes (Signed)
[facility] EMERGENCY DEPARTMENT Provider Note   CSN: [id number] Arrival date & time: 03/24/22  1907     History  Chief Complaint  Patient presents with   Chest Pain    Scott Young is a 71 y.o. male.  The history is provided by the patient.  Chest Pain He has history of hypertension, hyperlipidemia, coronary artery disease and comes in because of an episode of chest discomfort yesterday afternoon.  He states that he started feeling weak and having nausea and this is followed by a pressure feeling in his chest.  There was associated dyspnea but no vomiting and no diaphoresis.  He has had similar episodes in the past.  He took 1 dose of nitroglycerin with partial relief and a second dose of nitroglycerin with almost complete relief.  He called his cardiologist office and was told to come to the emergency department.  He had a heart catheterization 2 days ago which was reported to show small vessel disease.   Home Medications Prior to Admission medications   Medication Sig Start Date End Date Taking? Authorizing Provider  Ascorbic Acid (VITAMIN C) 1000 MG tablet Take 1,000 mg by mouth daily.   Yes [provider]  aspirin 81 MG tablet Take 81 mg by mouth at bedtime.    Yes [provider]  atorvastatin (LIPITOR) 20 MG tablet Take 20 mg by mouth at bedtime. 12/19/20  Yes [provider]  carboxymethylcellulose (REFRESH TEARS) 0.5 % SOLN Place 1 drop into both eyes daily.   Yes [provider]  clopidogrel (PLAVIX) 75 MG tablet TAKE 1 TABLET BY MOUTH DAILY  WITH BREAKFAST Patient taking differently: Take 75 mg by mouth daily. 03/08/22  Yes Lyn Records, MD  cyanocobalamin 1000 MCG tablet Take 1,000 mcg by mouth daily.   Yes [provider]  ezetimibe (ZETIA) 10 MG tablet Take 1 tablet (10 mg total) by mouth daily. Patient taking differently: Take 10 mg by mouth every evening. 10/24/18  Yes Lyn Records, MD  fexofenadine  (ALLEGRA) 180 MG tablet Take 180 mg by mouth daily.   Yes [provider]  FLUoxetine HCl 60 MG TABS Take 60 mg by mouth daily at 6 (six) AM.   Yes [provider]  fluticasone (FLONASE) 50 MCG/ACT nasal spray Place 1 spray into both nostrils daily. 09/04/15  Yes [provider]  losartan (COZAAR) 25 MG tablet Take 25 mg by mouth daily. 09/02/15  Yes [provider]  Magnesium 250 MG TABS Take 250 mg by mouth daily.   Yes [provider]  Milk Thistle 250 MG CAPS Take 250 mg by mouth daily.   Yes [provider]  Misc Natural Products (PROSTATE SUPPORT PO) Take 3 capsules by mouth daily.   Yes [provider]  nitroGLYCERIN (NITROSTAT) 0.4 MG SL tablet Place 1 tablet (0.4 mg total) under the tongue every 5 (five) minutes as needed for chest pain. 10/27/21  Yes Lyn Records, MD  pyridOXINE (VITAMIN B-6) 100 MG tablet Take 100 mg by mouth daily.   Yes [provider]  TRULICITY 0.75 MG/0.5ML SOPN Inject 0.75 mg as directed once a week. Sunday 07/15/21  Yes [provider]  zinc gluconate 50 MG tablet Take 50 mg by mouth daily.   Yes [provider]  [facility] VERIO test strip 1 each daily. 09/09/20   [provider]      Allergies    Codeine and Metformin and related  Review of Systems   Review of Systems  Cardiovascular:  Positive for chest pain.  All other systems reviewed and are negative.   Physical Exam Updated Vital Signs BP (!) 159/99   Pulse (!) 58   Temp (!) 97.1 F (36.2 C) (Oral)   Resp 14   SpO2 94%  Physical Exam Vitals and nursing note reviewed.   71 year old male, resting comfortably and in no acute distress. Vital signs are significant for elevated blood pressure and borderline slow heart rate. Oxygen saturation is 94%, which is normal. Head is normocephalic and atraumatic. PERRLA, EOMI. Oropharynx is clear. Neck is nontender and supple without adenopathy or JVD. Back is  nontender and there is no CVA tenderness. Lungs are clear without rales, wheezes, or rhonchi. Chest is nontender. Heart has regular rate and rhythm without murmur. Abdomen is soft, flat, nontender. Extremities have 1+ edema, full range of motion is present. Skin is warm and dry without rash. Moves all extremities equally.  ED Results / Procedures / Treatments   Labs (all labs ordered are listed, but only abnormal results are displayed) Labs Reviewed  COMPREHENSIVE METABOLIC PANEL - Abnormal; Notable for the following components:      Result Value   Glucose, Bld 111 (*)    Total Protein 6.4 (*)    All other components within normal limits  TROPONIN I (HIGH SENSITIVITY) - Abnormal; Notable for the following components:   Troponin I (High Sensitivity) 18 (*)    All other components within normal limits  CBC WITH DIFFERENTIAL/PLATELET  TROPONIN I (HIGH SENSITIVITY)    EKG EKG Interpretation  Date/Time:  Wednesday March 24 2022 20:20:16 EDT Ventricular Rate:  68 PR Interval:  288 QRS Duration: 88 QT Interval:  392 QTC Calculation: 416 R Axis:   20 Text Interpretation: Sinus rhythm with 1st degree A-V block Minimal voltage criteria for LVH, may be normal variant ( R in aVL ) Inferior infarct , age undetermined Cannot rule out Anterior infarct , age undetermined Abnormal ECG When compared with ECG of 23-Mar-2022 13:34, No significant change was found Confirmed by Dione Booze ([id number]) on 03/24/2022 11:23:40 PM  Radiology DG Chest 2 View  Result Date: 03/24/2022 CLINICAL DATA:  Chest pain EXAM: CHEST - 2 VIEW COMPARISON:  07/03/2018 FINDINGS: No acute airspace disease or effusion. Stable cardiomediastinal silhouette. No pneumothorax. IMPRESSION: No active cardiopulmonary disease. Electronically Signed   By: Jasmine Pang M.D.   On: 03/24/2022 20:56   CARDIAC CATHETERIZATION  Result Date: 03/23/2022 CONCLUSIONS: Diffuse atherosclerosis involving the proximal to mid LAD with  tandem 40% stenoses in the proximal and mid segment.  Ostial diagonal #2 contains 70% stenosis. Left main is widely patent Circumflex contains diffuse atherosclerosis but no focal obstruction. RCA contains diffuse atherosclerosis but no focal obstruction LAD suggests mild anteroapical hypokinesis.  EF 45 to 50%.  LVEDP 17 mmHg. 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  There may be some technical issues because the last shot at completion of the study demonstrated that the guide catheter had migrated out of the ostium of the left main.  This could influence both CFR and IMR. RECOMMENDATIONS: No progression of epicardial disease Evidence of microvascular dysfunction with low CFR, normal IMR during hyperemia, and elevated IMR at rest.  These findings suggest microvascular dysfunction and appropriate therapy would include an ACE inhibitor/statin/and antiplatelet therapy.  May need to consider adding a nondihydropyridine calcium channel blocker.    Procedures  Procedures  Cardiac monitor shows normal sinus rhythm, per my interpretation.  Medications Ordered in ED Medications - No data to display  ED Course/ Medical Decision Making/ A&P                           Medical Decision Making  Chest discomfort which resolved following 2 nitroglycerin consistent with angina pectoris.  With pain relief from nitroglycerin, doubt ACS.  No other symptoms or findings to suggest pulmonary embolism, aortic dissection.  I have reviewed and interpreted his electrocardiogram, and my interpretation is left ventricular hypertrophy unchanged from prior.  Chest x-ray shows no active cardiopulmonary process.  I have independently viewed the images, and agree with the radiologist's interpretation.  I have reviewed his past records, and note that on 03/23/2022 he had a cardiac catheterization which did note a 70% stenosis of diagonal #2 off of the LAD, and 40% stenoses in  the proximal and mid segments of the LAD.  Atherosclerotic disease was noted in the circumflex and right coronary arteries with no focal obstruction.  He was noted to have microvascular disease and recommendations were for ACE inhibitor/statin/antiplatelet therapy.  However, he is already on a statin and aspirin plus clopidogrel and is on an ARB.  Note was made that he may need to consider nondihydropyridine calcium channel blocker.  I reviewed and interpreted his laboratory tests, and my interpretation is borderline elevated random glucose level, mildly elevated troponin.  Elevated troponin is likely secondary to recent cardiac catheterization, repeat troponin is pending.  Elevated random glucose has been noted with prior blood draws.  Repeat troponin has increased slightly.  I still have very low index of suspicion for ACS.  However, with rising troponin, I feel cardiology evaluation is necessary.  Case is discussed with Dr. Davina Poke of cardiology service who states he will see the patient in the ED.  I anticipate discharge on long-acting nitroglycerin.  CRITICAL CARE Performed by: Delora Fuel Total critical care time: 35 minutes Critical care time was exclusive of separately billable procedures and treating other patients. Critical care was necessary to treat or prevent imminent or life-threatening deterioration. Critical care was time spent personally by me on the following activities: development of treatment plan with patient and/or surrogate as well as nursing, discussions with consultants, evaluation of patient's response to treatment, examination of patient, obtaining history from patient or surrogate, ordering and performing treatments and interventions, ordering and review of laboratory studies, ordering and review of radiographic studies, pulse oximetry and re-evaluation of patient's condition.  Final Clinical Impression(s) / ED Diagnoses Final diagnoses:  Angina pectoris ([place])  Elevated  troponin    Rx / DC Orders ED Discharge Orders     None         Delora Fuel, MD 97/41/63 [phone]

## 2022-03-25 NOTE — ED Provider Notes (Signed)
  Physical Exam  BP (!) 145/81   Pulse 68   Temp 97.8 F (36.6 C) (Oral)   Resp 16   SpO2 95%   Physical Exam Vitals and nursing note reviewed.  Constitutional:      General: He is not in acute distress.    Appearance: Normal appearance.  HENT:     Mouth/Throat:     Mouth: Mucous membranes are moist.  Eyes:     Conjunctiva/sclera: Conjunctivae normal.  Cardiovascular:     Rate and Rhythm: Normal rate and regular rhythm.  Pulmonary:     Effort: Pulmonary effort is normal. No respiratory distress.     Breath sounds: Normal breath sounds.  Abdominal:     General: Abdomen is flat.     Palpations: Abdomen is soft.     Tenderness: There is no abdominal tenderness.  Musculoskeletal:     Right lower leg: No edema.     Left lower leg: No edema.  Skin:    General: Skin is warm and dry.     Capillary Refill: Capillary refill takes less than 2 seconds.  Neurological:     Mental Status: He is alert and oriented to person, place, and time. Mental status is at baseline.  Psychiatric:        Mood and Affect: Mood normal.        Behavior: Behavior normal.     Procedures  .Critical Care  Performed by: Cristie Hem, MD Authorized by: Cristie Hem, MD   Critical care provider statement:    Critical care time (minutes):  30   Critical care end time:  03/25/2022 11:54 AM   Critical care was necessary to treat or prevent imminent or life-threatening deterioration of the following conditions:  Cardiac failure and circulatory failure   Critical care was time spent personally by me on the following activities:  Development of treatment plan with patient or surrogate, discussions with consultants, evaluation of patient's response to treatment, examination of patient, ordering and performing treatments and interventions, pulse oximetry, re-evaluation of patient's condition and review of old charts   ED Course / MDM   Clinical Course as of 03/25/22 1153  Thu Mar 25, 2022   0747 Received sign out pending cardiology evaluation. Patient with recent LHC showing microvascular disease. With anginal type chest pain, mildly elevated troponin. ECG reviewed by me, similar to previous ECG. Cardiology consulted for further management, possible medication adjustment and discharge vs admission. [WS]  1145 Cardiology evaluated patient.  Discussed with cardiology, seen by Dr. Harrington Challenger.  Cardiology will start patient on her.  Patient does have outpatient follow-up.  Discussed results with patient.  Discussed return precautions with the patient. Patient reports no ongoing chest pain after NTG . [WS]    Clinical Course User Index [WS] Cristie Hem, MD   Medical Decision Making Problems Addressed: Angina pectoris [facility]): acute illness or injury that poses a threat to life or bodily functions Elevated troponin: acute illness or injury that poses a threat to life or bodily functions  Amount and/or Complexity of Data Reviewed Independent Historian:     Details: Family member External Data Reviewed: labs, radiology and ECG.    Details: LHC Labs: ordered. Decision-making details documented in ED Course. ECG/medicine tests: independent interpretation performed.  Risk Prescription drug management. Decision regarding hospitalization.         Cristie Hem, MD 03/25/22 1154

## 2022-03-25 NOTE — Discharge Instructions (Addendum)
We evaluated you for your chest pain.  The cardiologists came and saw you.  We feel that it is safe to go home.  They have started you on a long-acting medicine called Imdur which can help prevent chest pain.  Please return if you experience severe pain, worsening pain, nausea or vomiting, fainting, sweating, difficulty breathing, or any other concerning symptoms.

## 2022-03-25 NOTE — Consult Note (Addendum)
Cardiology Consultation   Patient ID: Scott Young MRN: [id number]; DOB: 02-13-1951  Admit date: 03/24/2022 Date of Consult: 03/25/2022  PCP:  Seward Carol, MD   [place] [facility] Cardiologist:  Sinclair Grooms, MD    Patient Profile:   Scott Young is a 71 y.o. male with a hx of HTN, HLD, OSA and recent cardiac cath with mild nonobstructive disease who is being seen 03/25/2022 for the evaluation of chest pain at the request of Dr. Roxanne Mins.  History of Present Illness:   Scott Young is a 71 year old male with past medical history noted above.  He was recently seen in the office 10/5 with Dr. Tamala Julian after complaining of intermittent episodes of chest tightness.  He underwent outpatient cardiac catheterization 10/10 which showed diffuse atherosclerosis involving proximal/mid LAD with tandem 40% stenoses in the proximal/mid segment, ostial diagonal with 70% stenosis.  Coronary microvascular disease study suggested microvascular dysfunction.  He was continued on aspirin, Plavix, Zetia, atorvastatin 20 mg daily and losartan.  Presented to the ED 10/11 with left-sided chest pain that started around 3 PM.  Reports he was in his usual state of health up until the afternoon when he developed centralized/epigastric discomfort which radiated up into his chest and into his jaw.  He did take a sublingual nitroglycerin without significant improvement.  He took 81 mg aspirin and symptoms did start to dissipate.  When his wife arrived home around 5 PM she suggested that he take an additional sublingual nitroglycerin as his symptoms were not completely resolved.  He did call the office with his symptoms and was advised he should be evaluated in the ED.   In the ED his labs showed sodium 137, potassium 4.1, creatinine 1.1, high-sensitivity troponin 18>> 26, WBC 6.9, hemoglobin 14.2.  Chest x-ray negative.  EKG sinus rhythm with isolated T wave inversion in lead III present on prior tracings.   He was pain-free on arrival to the ED.  Cardiology asked to evaluate given his troponin elevation.    Past Medical History:  Diagnosis Date   Depression    H/O seasonal allergies    tx. OTC meds   Hyperlipidemia    Hypertension    Prostatitis    Sleep apnea    cpap   Umbilical hernia    resolved -s/p surgical repair    Past Surgical History:  Procedure Laterality Date   APPENDECTOMY     CATARACT EXTRACTION Left    30 yrs ago   COLONOSCOPY      multiple- x1 colon polyp removed   COLONOSCOPY WITH PROPOFOL N/A 10/22/2014   Procedure: COLONOSCOPY WITH PROPOFOL;  Surgeon: Garlan Fair, MD;  Location: [facility];  Service: Endoscopy;  Laterality: N/A;   EYE SURGERY     choriziom removed right eye 12'15   HERNIA REPAIR     umbilical hernia repair   INTRAVASCULAR PRESSURE WIRE/FFR STUDY N/A 03/23/2022   Procedure: INTRAVASCULAR PRESSURE WIRE/FFR STUDY;  Surgeon: Belva Crome, MD;  Location: [facility];  Service: Cardiovascular;  Laterality: N/A;   LEFT HEART CATH AND CORONARY ANGIOGRAPHY N/A 07/04/2018   Procedure: LEFT HEART CATH AND CORONARY ANGIOGRAPHY;  Surgeon: Belva Crome, MD;  Location: [facility];  Service: Cardiovascular;  Laterality: N/A;   LEFT HEART CATH AND CORONARY ANGIOGRAPHY N/A 03/23/2022   Procedure: LEFT HEART CATH AND CORONARY ANGIOGRAPHY;  Surgeon: Belva Crome, MD;  Location: [facility];  Service: Cardiovascular;  Laterality: N/A;  TONSILLECTOMY       Home Medications:  Prior to Admission medications   Medication Sig Start Date End Date Taking? Authorizing Provider  Ascorbic Acid (VITAMIN C) 1000 MG tablet Take 1,000 mg by mouth daily.   Yes [provider]  aspirin 81 MG tablet Take 81 mg by mouth at bedtime.    Yes [provider]  atorvastatin (LIPITOR) 20 MG tablet Take 20 mg by mouth at bedtime. 12/19/20  Yes [provider]  carboxymethylcellulose (REFRESH TEARS) 0.5 % SOLN Place 1 drop into  both eyes daily.   Yes [provider]  clopidogrel (PLAVIX) 75 MG tablet TAKE 1 TABLET BY MOUTH DAILY  WITH BREAKFAST Patient taking differently: Take 75 mg by mouth daily. 03/08/22  Yes Lyn Records, MD  cyanocobalamin 1000 MCG tablet Take 1,000 mcg by mouth daily.   Yes [provider]  ezetimibe (ZETIA) 10 MG tablet Take 1 tablet (10 mg total) by mouth daily. Patient taking differently: Take 10 mg by mouth every evening. 10/24/18  Yes Lyn Records, MD  fexofenadine (ALLEGRA) 180 MG tablet Take 180 mg by mouth daily.   Yes [provider]  FLUoxetine HCl 60 MG TABS Take 60 mg by mouth daily at 6 (six) AM.   Yes [provider]  fluticasone (FLONASE) 50 MCG/ACT nasal spray Place 1 spray into both nostrils daily. 09/04/15  Yes [provider]  losartan (COZAAR) 25 MG tablet Take 25 mg by mouth daily. 09/02/15  Yes [provider]  Magnesium 250 MG TABS Take 250 mg by mouth daily.   Yes [provider]  Milk Thistle 250 MG CAPS Take 250 mg by mouth daily.   Yes [provider]  Misc Natural Products (PROSTATE SUPPORT PO) Take 3 capsules by mouth daily.   Yes [provider]  nitroGLYCERIN (NITROSTAT) 0.4 MG SL tablet Place 1 tablet (0.4 mg total) under the tongue every 5 (five) minutes as needed for chest pain. 10/27/21  Yes Lyn Records, MD  pyridOXINE (VITAMIN B-6) 100 MG tablet Take 100 mg by mouth daily.   Yes [provider]  TRULICITY 0.75 MG/0.5ML SOPN Inject 0.75 mg as directed once a week. Sunday 07/15/21  Yes [provider]  zinc gluconate 50 MG tablet Take 50 mg by mouth daily.   Yes [provider]  [facility] VERIO test strip 1 each daily. 09/09/20   [provider]    Inpatient Medications: Scheduled Meds:  Continuous Infusions:  PRN Meds:   Allergies:    Allergies  Allergen Reactions   Codeine Nausea And Vomiting   Metformin And Related Diarrhea and Nausea  And Vomiting    Social History:   Social History   Socioeconomic History   Marital status: Married    Spouse name: Not on file   Number of children: Not on file   Years of education: Not on file   Highest education level: Not on file  Occupational History   Not on file  Tobacco Use   Smoking status: Former   Smokeless tobacco: Former    Quit date: 09/23/1970  Vaping Use   Vaping Use: Never used  Substance and Sexual Activity   Alcohol use: No   Drug use: No   Sexual activity: Not on file  Other Topics Concern   Not on file  Social History Narrative   Not on file   Social Determinants of Health   Financial Resource Strain: Not on file  Food Insecurity: Not on file  Transportation Needs: Not on file  Physical Activity: Not on file  Stress: Not on file  Social Connections: Not on file  Intimate Partner Violence: Not on file    Family History:    Family History  Problem Relation Age of Onset   Heart disease Mother    Cancer Father        colon   Aneurysm Father    Aneurysm Brother      ROS:  Please see the history of present illness.   All other ROS reviewed and negative.     Physical Exam/Data:   Vitals:   03/25/22 0830 03/25/22 0900 03/25/22 0930 03/25/22 1020  BP: 135/79 (!) 152/88 (!) 149/84 (!) 149/90  Pulse: 65 63 68 (!) 49  Resp: 15 14 14 20   Temp:      TempSrc:      SpO2: 100% 92% 98% 96%   No intake or output data in the 24 hours ending 03/25/22 1030    03/23/2022    9:37 AM 03/18/2022    8:48 AM 01/19/2021    9:12 AM  Last 3 Weights  Weight (lbs) 268 lb 277 lb 288 lb 9.6 oz  Weight (kg) 121.564 kg 125.646 kg 130.908 kg     There is no height or weight on file to calculate BMI.  General:  Well nourished, well developed, in no acute distress HEENT: normal Neck: no JVD Vascular: No carotid bruits; Distal pulses 2+ bilaterally Cardiac:  normal S1, S2; RRR; soft systolic murmur  Lungs:  clear to auscultation bilaterally, no wheezing,  rhonchi or rales  Abd: soft, nontender, no hepatomegaly  Ext: no edema Musculoskeletal:  No deformities, BUE and BLE strength normal and equal Skin: warm and dry  Neuro:  CNs 2-12 intact, no focal abnormalities noted Psych:  Normal affect   EKG:  The EKG was personally reviewed and demonstrates: Sinus rhythm with isolated T wave inversion in lead III Telemetry:  Telemetry was personally reviewed and demonstrates: Sinus rhythm  Relevant CV Studies:  Cath: 03/23/22  CONCLUSIONS: Diffuse atherosclerosis involving the proximal to mid LAD with tandem 40% stenoses in the proximal and mid segment.  Ostial diagonal #2 contains 70% stenosis. Left main is widely patent Circumflex contains diffuse atherosclerosis but no focal obstruction. RCA contains diffuse atherosclerosis but no focal obstruction LAD suggests mild anteroapical hypokinesis.  EF 45 to 50%.  LVEDP 17 mmHg. 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  There may be some technical issues because the last shot at completion of the study demonstrated that the guide catheter had migrated out of the ostium of the left main.  This could influence both CFR and IMR.   RECOMMENDATIONS: No progression of epicardial disease Evidence of microvascular dysfunction with low CFR, normal IMR during hyperemia, and elevated IMR at rest.  These findings suggest microvascular dysfunction and appropriate therapy would include an ACE inhibitor/statin/and antiplatelet therapy.  May need to consider adding a nondihydropyridine calcium channel blocker.  Diagnostic Dominance: Right     Laboratory Data:  High Sensitivity Troponin:   Recent Labs  Lab 03/24/22 2023 03/25/22 0524  TROPONINIHS 18* 26*     Chemistry Recent Labs  Lab 03/24/22 2023  NA 137  K 4.1  CL 102  CO2 24  GLUCOSE 111*  BUN 14  CREATININE 1.19  CALCIUM 9.0  GFRNONAA >60  ANIONGAP 11    Recent Labs  Lab  03/24/22 2023  PROT 6.4*  ALBUMIN 3.8  AST 33  ALT 24  ALKPHOS 95  BILITOT 0.9   Lipids No results for input(s): "CHOL", "TRIG", "HDL", "LABVLDL", "LDLCALC", "CHOLHDL" in the last 168 hours.  Hematology Recent Labs  Lab 03/24/22 2023  WBC 6.9  RBC 4.60  HGB 14.3  HCT 42.6  MCV 92.6  MCH 31.1  MCHC 33.6  RDW 12.8  PLT 206   Thyroid No results for input(s): "TSH", "FREET4" in the last 168 hours.  BNPNo results for input(s): "BNP", "PROBNP" in the last 168 hours.  DDimer No results for input(s): "DDIMER" in the last 168 hours.   Radiology/Studies:  DG Chest 2 View  Result Date: 03/24/2022 CLINICAL DATA:  Chest pain EXAM: CHEST - 2 VIEW COMPARISON:  07/03/2018 FINDINGS: No acute airspace disease or effusion. Stable cardiomediastinal silhouette. No pneumothorax. IMPRESSION: No active cardiopulmonary disease. Electronically Signed   By: Jasmine Pang M.D.   On: 03/24/2022 20:56   CARDIAC CATHETERIZATION  Result Date: 03/23/2022 CONCLUSIONS: Diffuse atherosclerosis involving the proximal to mid LAD with tandem 40% stenoses in the proximal and mid segment.  Ostial diagonal #2 contains 70% stenosis. Left main is widely patent Circumflex contains diffuse atherosclerosis but no focal obstruction. RCA contains diffuse atherosclerosis but no focal obstruction LAD suggests mild anteroapical hypokinesis.  EF 45 to 50%.  LVEDP 17 mmHg. 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  There may be some technical issues because the last shot at completion of the study demonstrated that the guide catheter had migrated out of the ostium of the left main.  This could influence both CFR and IMR. RECOMMENDATIONS: No progression of epicardial disease Evidence of microvascular dysfunction with low CFR, normal IMR during hyperemia, and elevated IMR at rest.  These findings suggest microvascular dysfunction and appropriate therapy would include an ACE  inhibitor/statin/and antiplatelet therapy.  May need to consider adding a nondihydropyridine calcium channel blocker.     Assessment and Plan:   Bertram Haddix is a 71 y.o. male with a hx of HTN, HLD, OSA and recent cardiac cath with mild nonobstructive disease who is being seen 03/25/2022 for the evaluation of chest pain at the request of Dr. Preston Fleeting.  Chest pain Microvascular disease --Went cardiac catheterization 10/10 with mild nonobstructive disease in LAD and diagonal.  Did undergo microvascular study with most suggestive of small vessel disease.  He did receive relief after 2 sublingual nitroglycerin. -- We will add Imdur 60 mg daily -- Continue aspirin, Plavix, statin, losartan -- Keep scheduled follow-up  Hypertension -- Blood pressures elevated in the ED but he has not had his normal medications -- Continue losartan 25 mg daily  Hyperlipidemia -- Continue atorvastatin 20 mg daily, Zetia 10 mg daily  -- he has been intolerant to higher doses  Will send new prescription for Imdur to his local pharmacy.  He will keep his scheduled follow-up appointment.  For questions or updates, please contact [facility] Please consult [email or web address] for contact info under    Signed, Laverda Page, NP  03/25/2022 10:30 AM  Patient seen.  Agree with findings as noted above by L Roberts    Pt recently admitted for CP with cath showing no very mild dz. Sugg of small vessel dz.      Will start Imdur.   Keep on same meds     Will make sure meds otherwise  Dietrich Pates MD

## 2022-03-26 NOTE — Telephone Encounter (Signed)
Spoke with pt's wife,DPR who is concerned re: pt's CP episode last night.  Pt's wife advised of Dr Thompson Caul recommendation to take nitro for CP episodes and keep follow up appointment as scheduled.  Reviewed ED precautions.  Pt's wife verbalized understanding and agrees with current plan.

## 2022-03-26 NOTE — Telephone Encounter (Signed)
Pt advised per Dr Tamala Julian - It is okay to use sublingual nitroglycerin even if he is on isosorbide mononitrate.  We need to see if nitroglycerin more rapidly relieves the chest discomfort. Reviewed ED precautions. Pt verbalizes understanding and agrees with current plan.

## 2022-03-26 NOTE — Telephone Encounter (Signed)
Pt c/o of Chest Pain: STAT if CP now or developed within 24 hours  1. Are you having CP right now? Chest is sore, but not having the deep pain  2. Are you experiencing any other symptoms (ex. SOB, nausea, vomiting, sweating)? No   3. How long have you been experiencing CP? Started again last night   4. Is your CP continuous or coming and going? Coming and going   5. Have you taken Nitroglycerin? No    Was put on isosorbide at the hospital, was not having CP when released. CP started reoccurring last night and he so he is wanting to know if isosorbide should be increased.  ?

## 2022-03-26 NOTE — Telephone Encounter (Signed)
Spoke with pt who states he was awakened around 2am with the same crushing, burning pain he had during his cath.  The pain lasted until 630am when it eased off.  He did not take any Nitro during the event and denies any additional symptoms.  Pt started Imdur 60mg  yesterday at noon.  He has had some headache which resolved with Tylenol.  Current BP 120/72 and HR-78. Pt advised will forward to Dr Tamala Julian and his RN  for further review and recommendation.  Pt verbalizes understanding and agrees with current plan.

## 2022-03-29 ENCOUNTER — Ambulatory Visit: Payer: Medicare Other | Admitting: Nurse Practitioner

## 2022-04-07 ENCOUNTER — Telehealth: Payer: Self-pay | Admitting: Interventional Cardiology

## 2022-04-07 MED ORDER — NITROGLYCERIN 0.4 MG SL SUBL: 0.4000 mg | SUBLINGUAL_TABLET | SUBLINGUAL | 11 refills | Status: AC | PRN

## 2022-04-07 NOTE — Telephone Encounter (Signed)
Pt's medication was sent to pt's pharmacy as requested. Confirmation received.  °

## 2022-04-07 NOTE — Telephone Encounter (Signed)
*  STAT* If patient is at the pharmacy, call can be transferred to refill team.   1. Which medications need to be refilled? (please list name of each medication and dose if known) nitroGLYCERIN (NITROSTAT) 0.4 MG SL tablet  2. Which pharmacy/location (including street and city if local pharmacy) is medication to be sent to? [place], [place] - [address]  3. Do they need a 30 day or 90 day supply? 30 day

## 2022-04-09 ENCOUNTER — Other Ambulatory Visit: Payer: Self-pay | Admitting: *Deleted

## 2022-04-09 MED ORDER — ISOSORBIDE MONONITRATE ER 60 MG PO TB24: 60.0000 mg | ORAL_TABLET | Freq: Every day | ORAL | 1 refills | Status: DC

## 2022-04-17 NOTE — Progress Notes (Unsigned)
Cardiology Office Note:    Date:  04/19/2022   ID:  Scott Young, DOB May 23, 1951, MRN [id number]  PCP:  Seward Carol, MD   [facility] Providers Cardiologist:  Sinclair Grooms, MD     Referring MD: Seward Carol, MD   Chief Complaint: CAD, microvascular angina  History of Present Illness:    Scott Young is a very pleasant 71 y.o. male with a hx of CAD, HTN, HLD, sleep apnea, and first degree AV block, and OSA on CPAP.  Cardiac catheterization 07/04/2018 revealed nonobstructive coronary artery disease in LAD, circumflex, and right coronary.  Seen by Dr. Tamala Julian on 03/18/2022 with increased dyspnea on exertion.  Within the previous 2 to 4 weeks he had recurring episodes of tightness in his chest.  That week he had a prolonged episode of chest pressure that required 3 nitroglycerin tablets to resolve.  EKG at office visit revealed more well-formed inferior Q waves than on previous tracings.  He was not having any chest discomfort at the time of the visit.  Through shared decision making, it was decided to proceed with coronary angiography with possible PCI.   Left heart cath 03/23/2022 revealed diffuse atherosclerosis involving the proximal to mid LAD and tandem 40% stenosis in the proximal and mid segment.  Ostial diagonal #2 contains 70% stenosis.  LM widely patent.  Circumflex and RCA with diffuse atherosclerosis but no focal obstruction.  LAD suggest mild anterior apical hypokinesis, EF 45 to 50%, LVEDP 17 mmHg. Evidence of microvascular dysfunction with low CFR, normal IMR during hyperemia and elevated INR at rest.  Appropriate therapy including ACE inhibitor/statin/antiplatelet therapy with consideration of non-dihydropyridine CCB.   Seen in ED on 03/25/2022 for chest pain.  He reported being in his usual state of health until the afternoon when he developed centralized/epigastric discomfort radiating up into his chest and into his jaw.  He took 1 SL NTG without sniffing  improvement.  Took 81 mg aspirin and symptoms improved.  Around 5 PM when his wife returned home.  She advised him to take an additional SL NTG and he was advised by our office to go to ED for evaluation. Hs troponin mildly elevated at 18 ? 26.  EKG revealed SR with isolated T wave inversion in lead III, present on prior tracings.  He was pain-free on arrival to the ED.  Imdur 60 mg daily was added. Continue losartan, aspirin, Plavix, atorvastatin, Zetia.  Today, he is here alone for follow-up. Reports he feels the best he has felt in a long time. Has not had any chest pain since the return ED visit on 03/25/22. Has resumed exercise, walks 3 x per week 1.6 miles, half is uphill. No chest pain, DOE, palpitations or other symptoms with walking. Has mild occasional pain at the base of his skull since starting Imdur.     Past Medical History:  Diagnosis Date   Depression    H/O seasonal allergies    tx. OTC meds   Hyperlipidemia    Hypertension    Prostatitis    Sleep apnea    cpap   Umbilical hernia    resolved -s/p surgical repair    Past Surgical History:  Procedure Laterality Date   APPENDECTOMY     CATARACT EXTRACTION Left    30 yrs ago   COLONOSCOPY      multiple- x1 colon polyp removed   COLONOSCOPY WITH PROPOFOL N/A 10/22/2014   Procedure: COLONOSCOPY WITH PROPOFOL;  Surgeon: Garlan Fair,  MD;  Location: [facility];  Service: Endoscopy;  Laterality: N/A;   EYE SURGERY     choriziom removed right eye 12'15   HERNIA REPAIR     umbilical hernia repair   INTRAVASCULAR PRESSURE WIRE/FFR STUDY N/A 03/23/2022   Procedure: INTRAVASCULAR PRESSURE WIRE/FFR STUDY;  Surgeon: Belva Crome, MD;  Location: [place] [facility];  Service: Cardiovascular;  Laterality: N/A;   LEFT HEART CATH AND CORONARY ANGIOGRAPHY N/A 07/04/2018   Procedure: LEFT HEART CATH AND CORONARY ANGIOGRAPHY;  Surgeon: Belva Crome, MD;  Location: [place] [facility];  Service: Cardiovascular;  Laterality: N/A;    LEFT HEART CATH AND CORONARY ANGIOGRAPHY N/A 03/23/2022   Procedure: LEFT HEART CATH AND CORONARY ANGIOGRAPHY;  Surgeon: Belva Crome, MD;  Location: [place] [facility];  Service: Cardiovascular;  Laterality: N/A;   TONSILLECTOMY      Current Medications: Current Meds  Medication Sig   Ascorbic Acid (VITAMIN C) 1000 MG tablet Take 1,000 mg by mouth daily.   aspirin 81 MG tablet Take 81 mg by mouth at bedtime.    atorvastatin (LIPITOR) 20 MG tablet Take 20 mg by mouth at bedtime.   carboxymethylcellulose (REFRESH TEARS) 0.5 % SOLN Place 1 drop into both eyes daily.   clopidogrel (PLAVIX) 75 MG tablet TAKE 1 TABLET BY MOUTH DAILY  WITH BREAKFAST (Patient taking differently: Take 75 mg by mouth daily.)   cyanocobalamin 1000 MCG tablet Take 1,000 mcg by mouth daily.   ezetimibe (ZETIA) 10 MG tablet Take 1 tablet (10 mg total) by mouth daily. (Patient taking differently: Take 10 mg by mouth every evening.)   fexofenadine (ALLEGRA) 180 MG tablet Take 180 mg by mouth daily.   FLUoxetine HCl 60 MG TABS Take 60 mg by mouth daily at 6 (six) AM.   fluticasone (FLONASE) 50 MCG/ACT nasal spray Place 1 spray into both nostrils daily.   isosorbide mononitrate (IMDUR) 60 MG 24 hr tablet Take 1 tablet (60 mg total) by mouth daily.   losartan (COZAAR) 25 MG tablet Take 25 mg by mouth daily.   Magnesium 250 MG TABS Take 250 mg by mouth daily.   Milk Thistle 250 MG CAPS Take 250 mg by mouth daily.   Misc Natural Products (PROSTATE SUPPORT PO) Take 3 capsules by mouth daily.   nitroGLYCERIN (NITROSTAT) 0.4 MG SL tablet Place 1 tablet (0.4 mg total) under the tongue every 5 (five) minutes as needed for chest pain.   ONETOUCH VERIO test strip 1 each daily.   pyridOXINE (VITAMIN B-6) 100 MG tablet Take 100 mg by mouth daily.   TRULICITY [id number] 0000000 SOPN Inject 0.75 mg as directed once a week. Sunday   zinc gluconate 50 MG tablet Take 50 mg by mouth daily.     Allergies:   Codeine and Metformin and related    Social History   Socioeconomic History   Marital status: Married    Spouse name: Not on file   Number of children: Not on file   Years of education: Not on file   Highest education level: Not on file  Occupational History   Not on file  Tobacco Use   Smoking status: Former   Smokeless tobacco: Former    Quit date: 09/23/1970  Vaping Use   Vaping Use: Never used  Substance and Sexual Activity   Alcohol use: No   Drug use: No   Sexual activity: Not on file  Other Topics Concern   Not on file  Social History  Narrative   Not on file   Social Determinants of Health   Financial Resource Strain: Not on file  Food Insecurity: Not on file  Transportation Needs: Not on file  Physical Activity: Not on file  Stress: Not on file  Social Connections: Not on file     Family History: The patient's family history includes Aneurysm in his brother and father; Cancer in his father; Heart disease in his mother.  ROS:   Please see the history of present illness.   All other systems reviewed and are negative.  Labs/Other Studies Reviewed:    The following studies were reviewed today:  LHC 03/23/22  CONCLUSIONS: Diffuse atherosclerosis involving the proximal to mid LAD with tandem 40% stenoses in the proximal and mid segment.  Ostial diagonal #2 contains 70% stenosis. Left main is widely patent Circumflex contains diffuse atherosclerosis but no focal obstruction. RCA contains diffuse atherosclerosis but no focal obstruction LAD suggests mild anteroapical hypokinesis.  EF 45 to 50%.  LVEDP 17 mmHg. 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  There may be some technical issues because the last shot at completion of the study demonstrated that the guide catheter had migrated out of the ostium of the left main.  This could influence both CFR and IMR.   RECOMMENDATIONS: No progression of epicardial disease Evidence of  microvascular dysfunction with low CFR, normal IMR during hyperemia, and elevated IMR at rest.  These findings suggest microvascular dysfunction and appropriate therapy would include an ACE inhibitor/statin/and antiplatelet therapy.  May need to consider adding a nondihydropyridine calcium channel blocker.   Diagnostic Dominance: Right    Echo 02/06/21   1. Left ventricular ejection fraction, by estimation, is 60 to 65%. The  left ventricle has normal function. The left ventricle has no regional  wall motion abnormalities. There is mild left ventricular hypertrophy.  Left ventricular diastolic parameters  were normal.   2. Right ventricular systolic function is normal. The right ventricular  size is normal.   3. The mitral valve is normal in structure. Mild mitral valve  regurgitation.   4. AV is thickened, calcified with mildly restricted motion. Peak and  mean gradients through the valve are 22 and 14 mm Hg respectively.  Compared to previous echo from 2020, mild increase in mean gradient. . The  aortic valve is tricuspid. Aortic valve  regurgitation is not visualized.   5. The inferior vena cava is normal in size with greater than 50%  respiratory variability, suggesting right atrial pressure of 3 mmHg.   Comparison(s): The left ventricular function is unchanged.    Recent Labs: 03/24/2022: ALT 24; BUN 14; Creatinine, Ser 1.19; Hemoglobin 14.3; Platelets 206; Potassium 4.1; Sodium 137  Recent Lipid Panel    Component Value Date/Time   CHOL 187 07/04/2018 0156   TRIG 97 07/04/2018 0156   HDL 47 07/04/2018 0156   CHOLHDL 4.0 07/04/2018 0156   VLDL 19 07/04/2018 0156   LDLCALC 121 (H) 07/04/2018 0156     Risk Assessment/Calculations:           Physical Exam:    VS:  BP 126/78   Pulse 68   Ht 6' (1.829 m)   Wt 277 lb 9.6 oz (125.9 kg)   SpO2 94%   BMI 37.65 kg/m     Wt Readings from Last 3 Encounters:  04/19/22 277 lb 9.6 oz (125.9 kg)  03/23/22 268 lb  (121.6 kg)  03/18/22 277  lb (125.6 kg)     GEN:  Well nourished, well developed in no acute distress HEENT: Normal NECK: No JVD; No carotid bruits CARDIAC: RRR, no murmurs, rubs, gallops RESPIRATORY:  Clear to auscultation without rales, wheezing or rhonchi  ABDOMEN: Soft, non-tender, protuberant MUSCULOSKELETAL:  No edema; No deformity. 2+ pedal pulses, equal bilaterally SKIN: Warm and dry NEUROLOGIC:  Alert and oriented x 3 PSYCHIATRIC:  Normal affect   EKG:  EKG is not ordered today.    Diagnoses:    1. Coronary artery disease involving native coronary artery of native heart without angina pectoris   2. Essential hypertension   3. Aortic valve stenosis, etiology of cardiac valve disease unspecified   4. Murmur   5. Hyperlipidemia LDL goal <70   6. Nonrheumatic aortic valve stenosis   7. Microvascular angina   8. Ascending aorta dilatation (HCC)   9. Class 2 severe obesity with serious comorbidity and body mass index (BMI) of 37.0 to 37.9 in adult, unspecified obesity type (HCC)    Assessment and Plan:     CAD, non-obstructive: LHC 10/10 showed diffuse atherosclerosis involving proximal/mid LAD with tandem 40% stenoses in the proximal/mid segment, ostial diagonal with 70% stenosis, no focal obstruction circumflex, RCA. Widely patent left main. Symptoms of angina significantly improved on current medical therapy.    Microvascular disease: Coronary microvascular  dysfunction with low CFR, normal IMR during hyperemia, and elevated IMR at rest suggestive of microvascular dysfunction on [facility] 03/23/22. He is feeling great on GDMT including losartan, isosorbide mononitrate, Plavix, aspirin, and atorvastatin. No bleeding concerns.  No changes in medical therapy today.  Aortic stenosis: Echo 02/06/21 revealed AV is thickened, calcified with mildly restricted motion. Peak and mean gradients through the valve are 22 and 14 mmHg respectively. Mild increase in mean gradient from 2020. Will  repeat echo for evaluation of structural disease.   Thoracic aortic dilatation: Ascending aortic diameter 40 mm on echo 2020 no aortic abnormality on echo 01/2021. Family history of aortic aneurysm. Will repeat echo for evaluation.  Hypertension: BP is well-controlled in the clinic. Home BP also well-controlled.   Obesity: He is working on weight loss through a weight loss management program. Has significantly reduced his portion sizes. Encouraged continued weight loss and 150 minutes of moderate intensity exercise each week.   Hyperlipidemia LDL goal < 70: LDL 34 on 11/13/21.  Continue atorvastatin and ezetimibe.     Disposition: 6 months with Dr. Irish Lack (transfer from Dr. Tamala Julian)  Medication Adjustments/Labs and Tests Ordered: Current medicines are reviewed at length with the patient today.  Concerns regarding medicines are outlined above.  Orders Placed This Encounter  Procedures   ECHOCARDIOGRAM COMPLETE   No orders of the defined types were placed in this encounter.   Patient Instructions  Medication Instructions:   Your physician recommends that you continue on your current medications as directed. Please refer to the Current Medication list given to you today.   *If you need a refill on your cardiac medications before your next appointment, please call your pharmacy*   Lab Work:  None ordered.  If you have labs (blood work) drawn today and your tests are completely normal, you will receive your results only by: [place] (if you have MyChart) OR A paper copy in the mail If you have any lab test that is abnormal or we need to change your treatment, we will call you to review the results.   Testing/Procedures:  Your physician has requested that you have  an echocardiogram. Echocardiography is a painless test that uses sound waves to create images of your heart. It provides your doctor with information about the size and shape of your heart and how well your heart's  chambers and valves are working. This procedure takes approximately one hour. There are no restrictions for this procedure. Please do NOT wear cologne, perfume, aftershave, or lotions (deodorant is allowed). Please arrive 15 minutes prior to your appointment time.    Follow-Up: At [facility], you and your health needs are our priority.  As part of our continuing mission to provide you with exceptional heart care, we have created designated Provider Care Teams.  These Care Teams include your primary Cardiologist (physician) and Advanced Practice Providers (APPs -  Physician Assistants and Nurse Practitioners) who all work together to provide you with the care you need, when you need it.  We recommend signing up for the patient portal called "MyChart".  Sign up information is provided on this After Visit Summary.  MyChart is used to connect with patients for Virtual Visits (Telemedicine).  Patients are able to view lab/test results, encounter notes, upcoming appointments, etc.  Non-urgent messages can be sent to your provider as well.   To learn more about what you can do with MyChart, go to [email or web address].    Your next appointment:   6 month(s)  The format for your next appointment:   In Person  Provider:   Casandra Doffing, MD    Other Instructions  Your physician wants you to follow-up in: 6 months with Dr. Irish Lack.  You will receive a reminder letter in the mail two months in advance. If you don't receive a letter, please call our office to schedule the follow-up appointment.   Important Information About Sugar         Signed, Emmaline Life, NP  04/19/2022 5:18 PM    [place]

## 2022-04-19 ENCOUNTER — Encounter: Payer: Self-pay | Admitting: Nurse Practitioner

## 2022-04-19 ENCOUNTER — Ambulatory Visit: Payer: Medicare Other | Attending: Nurse Practitioner | Admitting: Nurse Practitioner

## 2022-04-19 VITALS — BP 126/78 | HR 68 | Ht 72.0 in | Wt 277.6 lb

## 2022-04-19 DIAGNOSIS — I2089 Other forms of angina pectoris: Secondary | ICD-10-CM

## 2022-04-19 DIAGNOSIS — R011 Cardiac murmur, unspecified: Secondary | ICD-10-CM | POA: Diagnosis not present

## 2022-04-19 DIAGNOSIS — I2510 Atherosclerotic heart disease of native coronary artery without angina pectoris: Secondary | ICD-10-CM

## 2022-04-19 DIAGNOSIS — Z6837 Body mass index (BMI) 37.0-37.9, adult: Secondary | ICD-10-CM

## 2022-04-19 DIAGNOSIS — I350 Nonrheumatic aortic (valve) stenosis: Secondary | ICD-10-CM | POA: Diagnosis not present

## 2022-04-19 DIAGNOSIS — I10 Essential (primary) hypertension: Secondary | ICD-10-CM | POA: Diagnosis not present

## 2022-04-19 DIAGNOSIS — I77810 Thoracic aortic ectasia: Secondary | ICD-10-CM

## 2022-04-19 DIAGNOSIS — E785 Hyperlipidemia, unspecified: Secondary | ICD-10-CM

## 2022-04-19 NOTE — Patient Instructions (Addendum)
Medication Instructions:   Your physician recommends that you continue on your current medications as directed. Please refer to the Current Medication list given to you today.   *If you need a refill on your cardiac medications before your next appointment, please call your pharmacy*   Lab Work:  None ordered.  If you have labs (blood work) drawn today and your tests are completely normal, you will receive your results only by: [place] (if you have MyChart) OR A paper copy in the mail If you have any lab test that is abnormal or we need to change your treatment, we will call you to review the results.   Testing/Procedures:  Your physician has requested that you have an echocardiogram. Echocardiography is a painless test that uses sound waves to create images of your heart. It provides your doctor with information about the size and shape of your heart and how well your heart's chambers and valves are working. This procedure takes approximately one hour. There are no restrictions for this procedure. Please do NOT wear cologne, perfume, aftershave, or lotions (deodorant is allowed). Please arrive 15 minutes prior to your appointment time.    Follow-Up: At [facility], you and your health needs are our priority.  As part of our continuing mission to provide you with exceptional heart care, we have created designated Provider Care Teams.  These Care Teams include your primary Cardiologist (physician) and Advanced Practice Providers (APPs -  Physician Assistants and Nurse Practitioners) who all work together to provide you with the care you need, when you need it.  We recommend signing up for the patient portal called "MyChart".  Sign up information is provided on this After Visit Summary.  MyChart is used to connect with patients for Virtual Visits (Telemedicine).  Patients are able to view lab/test results, encounter notes, upcoming appointments, etc.  Non-urgent messages  can be sent to your provider as well.   To learn more about what you can do with MyChart, go to [email or web address].    Your next appointment:   6 month(s)  The format for your next appointment:   In Person  Provider:   Casandra Doffing, MD    Other Instructions  Your physician wants you to follow-up in: 6 months with Dr. Irish Lack.  You will receive a reminder letter in the mail two months in advance. If you don't receive a letter, please call our office to schedule the follow-up appointment.   Important Information About Sugar

## 2022-05-13 ENCOUNTER — Ambulatory Visit (HOSPITAL_COMMUNITY): Payer: Medicare Other | Attending: Internal Medicine

## 2022-05-13 DIAGNOSIS — I10 Essential (primary) hypertension: Secondary | ICD-10-CM | POA: Insufficient documentation

## 2022-05-13 DIAGNOSIS — I350 Nonrheumatic aortic (valve) stenosis: Secondary | ICD-10-CM | POA: Diagnosis not present

## 2022-05-13 DIAGNOSIS — I2510 Atherosclerotic heart disease of native coronary artery without angina pectoris: Secondary | ICD-10-CM | POA: Insufficient documentation

## 2022-05-13 LAB — ECHOCARDIOGRAM COMPLETE
AR max vel: 1.43 cm2
AV Area VTI: 1.54 cm2
AV Area mean vel: 1.48 cm2
AV Mean grad: 16 mmHg
AV Peak grad: 28.9 mmHg
Ao pk vel: 2.69 m/s
Area-P 1/2: 3.74 cm2
S' Lateral: 3.5 cm

## 2022-05-13 MED ORDER — PERFLUTREN LIPID MICROSPHERE
1.0000 mL | INTRAVENOUS | Status: AC | PRN
  Administered 2022-05-13: 2 mL via INTRAVENOUS

## 2022-07-05 ENCOUNTER — Other Ambulatory Visit: Payer: Self-pay | Admitting: Interventional Cardiology

## 2022-07-05 ENCOUNTER — Other Ambulatory Visit: Payer: Self-pay | Admitting: Psychiatry

## 2022-07-05 DIAGNOSIS — F32A Depression, unspecified: Secondary | ICD-10-CM

## 2022-08-03 ENCOUNTER — Encounter: Payer: Self-pay | Admitting: Psychiatry

## 2022-08-03 ENCOUNTER — Ambulatory Visit: Payer: Medicare Other | Admitting: Psychiatry

## 2022-08-03 DIAGNOSIS — F32A Depression, unspecified: Secondary | ICD-10-CM | POA: Diagnosis not present

## 2022-08-03 MED ORDER — FLUOXETINE HCL 20 MG PO CAPS: 60.0000 mg | ORAL_CAPSULE | Freq: Every day | ORAL | 3 refills | Status: DC

## 2022-08-03 NOTE — Progress Notes (Unsigned)
Drevin Wiles [id number] 08-06-1950 72 y.o.  Subjective:   Patient ID:  Scott Young is a 72 y.o. (DOB Feb 04, 1951) male.  Chief Complaint: No chief complaint on file.   HPI Dequincy Clemensen presents to the office today for follow-up of depression.   He reports he has had "a little off and on" depression. He reports occ brief episodes of lower energy and motivation. He reports that it is "short-lived" and he is able to push through this and it does not last more than a few hours. He reports that otherwise his energy and motivation has been good and improved overall. Denies any significant or unexplained anxiety. He reports that his sleep has been good. He reports on some occasions immediately upon awakening he will things and people that are not there. He reports that it is typically, "soft and gentle and beautiful." He reports that there are a few occasions where he has perceived these things as real and reacted to them. He reports that it lasts only a few seconds. He went through a period where this was occurring almost nightly for a week and then it is infrequent. He reports that his appetite has ben good. He has been trying to limit sugar. He reports adequate concentration. He reports that his focus and clarity is not as good first thing in the morning. Denies SI.   He reports that about a month ago he noticed a period of depression for about 3 weeks.  He continues to use his cPap machine.  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 He repots that he and his wife are contemplating retirement. He and his wife are planning to move to their townhouse in [place]. Daughter has a neuromuscular disease and neurocognitive disorder and has been living in their townhouse. They plan on trying to help care for her.   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    [place] ED from 03/24/2022 in [facility] at [facility] Admission (Discharged)  from 03/23/2022 in [place] CATH LAB  C-SSRS RISK CATEGORY No Risk No Risk        Review of Systems:  Review of Systems  Gastrointestinal:        Nausea after taking Trulicity. He has notified PCP  Musculoskeletal:  Negative for gait problem.  Psychiatric/Behavioral:         Please refer to HPI    Medications: I have reviewed the patient's current medications.  Current Outpatient Medications  Medication Sig Dispense Refill   Ascorbic Acid (VITAMIN C) 1000 MG tablet Take 1,000 mg by mouth daily.     aspirin 81 MG tablet Take 81 mg by mouth at bedtime.      atorvastatin (LIPITOR) 20 MG tablet Take 20 mg by mouth at bedtime.     carboxymethylcellulose (REFRESH TEARS) 0.5 % SOLN Place 1 drop into both eyes daily.     clopidogrel (PLAVIX) 75 MG tablet TAKE 1 TABLET BY MOUTH DAILY  WITH BREAKFAST 15 tablet 23   cyanocobalamin 1000 MCG tablet Take 1,000 mcg by mouth daily.     ezetimibe (ZETIA) 10 MG tablet Take 1 tablet (10 mg total) by mouth daily. (Patient taking differently: Take 10 mg by mouth every evening.) 90 tablet 3   fexofenadine (ALLEGRA) 180 MG tablet Take 180 mg by mouth daily.     FLUoxetine (PROZAC) 20 MG capsule TAKE 3 CAPSULES BY MOUTH DAILY 270 capsule 3  FLUoxetine HCl 60 MG TABS Take 60 mg by mouth daily at 6 (six) AM.     fluticasone (FLONASE) 50 MCG/ACT nasal spray Place 1 spray into both nostrils daily.     isosorbide mononitrate (IMDUR) 60 MG 24 hr tablet TAKE 1 TABLET BY MOUTH DAILY 180 tablet 1   losartan (COZAAR) 25 MG tablet Take 25 mg by mouth daily.     Magnesium 250 MG TABS Take 250 mg by mouth daily.     Milk Thistle 250 MG CAPS Take 250 mg by mouth daily.     Misc Natural Products (PROSTATE SUPPORT PO) Take 3 capsules by mouth daily.     nitroGLYCERIN (NITROSTAT) 0.4 MG SL tablet Place 1 tablet (0.4 mg total) under the tongue every 5 (five) minutes as needed for chest pain. 25 tablet 11   ONETOUCH VERIO test strip 1 each  daily.     pyridOXINE (VITAMIN B-6) 100 MG tablet Take 100 mg by mouth daily.     TRULICITY A999333 0000000 SOPN Inject 0.75 mg as directed once a week. Sunday     zinc gluconate 50 MG tablet Take 50 mg by mouth daily.     No current facility-administered medications for this visit.    Medication Side Effects: None  Allergies:  Allergies  Allergen Reactions   Codeine Nausea And Vomiting   Metformin And Related Diarrhea and Nausea And Vomiting    Past Medical History:  Diagnosis Date   Depression    H/O seasonal allergies    tx. OTC meds   Hyperlipidemia    Hypertension    Prostatitis    Sleep apnea    cpap   Umbilical hernia    resolved -s/p surgical repair    Past Medical History, Surgical history, Social history, and Family history were reviewed and updated as appropriate.   Please see review of systems for further details on the patient's review from today.   Objective:   Physical Exam:  There were no vitals taken for this visit.  Physical Exam  Lab Review:     Component Value Date/Time   NA 137 03/24/2022 2023   NA 137 03/18/2022 0936   K 4.1 03/24/2022 2023   CL 102 03/24/2022 2023   CO2 24 03/24/2022 2023   GLUCOSE 111 (H) 03/24/2022 2023   BUN 14 03/24/2022 2023   BUN 18 03/18/2022 0936   CREATININE 1.19 03/24/2022 2023   CREATININE 1.20 12/06/2011 1133   CALCIUM 9.0 03/24/2022 2023   PROT 6.4 (L) 03/24/2022 2023   ALBUMIN 3.8 03/24/2022 2023   AST 33 03/24/2022 2023   ALT 24 03/24/2022 2023   ALKPHOS 95 03/24/2022 2023   BILITOT 0.9 03/24/2022 2023   GFRNONAA >60 03/24/2022 2023   GFRAA >60 07/05/2018 0525       Component Value Date/Time   WBC 6.9 03/24/2022 2023   RBC 4.60 03/24/2022 2023   HGB 14.3 03/24/2022 2023   HGB 14.2 03/18/2022 0936   HCT 42.6 03/24/2022 2023   HCT 41.9 03/18/2022 0936   PLT 206 03/24/2022 2023   PLT 196 03/18/2022 0936   MCV 92.6 03/24/2022 2023   MCV 92 03/18/2022 0936   MCH 31.1 03/24/2022 2023   MCHC  33.6 03/24/2022 2023   RDW 12.8 03/24/2022 2023   RDW 13.4 03/18/2022 0936   LYMPHSABS 2.8 03/24/2022 2023   MONOABS 0.5 03/24/2022 2023   EOSABS 0.2 03/24/2022 2023   BASOSABS 0.0 03/24/2022 2023    No results found  for: "POCLITH", "LITHIUM"   No results found for: "PHENYTOIN", "PHENOBARB", "VALPROATE", "CBMZ"   .res Assessment: Plan:    Discussed incresing Prozac to 80 mg if depression recurs.   There are no diagnoses linked to this encounter.   Please see After Visit Summary for patient specific instructions.  No future appointments.  No orders of the defined types were placed in this encounter.   -------------------------------

## 2022-08-09 ENCOUNTER — Other Ambulatory Visit: Payer: Self-pay | Admitting: *Deleted

## 2022-08-09 ENCOUNTER — Other Ambulatory Visit: Payer: Self-pay

## 2022-08-09 MED ORDER — ISOSORBIDE MONONITRATE ER 60 MG PO TB24: 60.0000 mg | ORAL_TABLET | Freq: Every day | ORAL | 2 refills | Status: DC

## 2022-08-09 MED ORDER — ISOSORBIDE MONONITRATE ER 60 MG PO TB24: 60.0000 mg | ORAL_TABLET | Freq: Every day | ORAL | 1 refills | Status: DC

## 2022-08-09 NOTE — Addendum Note (Signed)
Addended by: Gaetano Net on: 08/09/2022 10:00 AM   Modules accepted: Orders

## 2022-09-21 ENCOUNTER — Other Ambulatory Visit: Payer: Self-pay | Admitting: *Deleted

## 2022-09-21 MED ORDER — CLOPIDOGREL BISULFATE 75 MG PO TABS: 75.0000 mg | ORAL_TABLET | Freq: Every day | ORAL | 0 refills | Status: DC

## 2022-09-21 NOTE — Addendum Note (Signed)
Addended by: Burnetta Sabin on: 09/21/2022 04:27 PM   Modules accepted: Orders

## 2022-10-14 ENCOUNTER — Other Ambulatory Visit: Payer: Self-pay | Admitting: Nurse Practitioner

## 2022-11-15 ENCOUNTER — Other Ambulatory Visit: Payer: Self-pay | Admitting: Interventional Cardiology

## 2022-11-16 ENCOUNTER — Encounter: Payer: Self-pay | Admitting: Interventional Cardiology

## 2022-11-16 ENCOUNTER — Ambulatory Visit: Payer: Medicare Other | Attending: Interventional Cardiology | Admitting: Interventional Cardiology

## 2022-11-16 VITALS — BP 118/80 | HR 73 | Ht 72.0 in | Wt 281.8 lb

## 2022-11-16 DIAGNOSIS — I350 Nonrheumatic aortic (valve) stenosis: Secondary | ICD-10-CM

## 2022-11-16 DIAGNOSIS — E785 Hyperlipidemia, unspecified: Secondary | ICD-10-CM | POA: Diagnosis not present

## 2022-11-16 DIAGNOSIS — I2089 Other forms of angina pectoris: Secondary | ICD-10-CM

## 2022-11-16 DIAGNOSIS — I10 Essential (primary) hypertension: Secondary | ICD-10-CM

## 2022-11-16 NOTE — Progress Notes (Signed)
Cardiology Office Note   Date:  11/16/2022   ID:  Scott Young, DOB Oct 03, 1950, MRN [id number]  PCP:  Renford Dills, MD    No chief complaint on file.  CAD  Wt Readings from Last 3 Encounters:  11/16/22 281 lb 12.8 oz (127.8 kg)  04/19/22 277 lb 9.6 oz (125.9 kg)  03/23/22 268 lb (121.6 kg)       History of Present Illness: Scott Young is a 72 y.o. male former patient of Dr. Katrinka Blazing  with a hx of CAD, HTN, HLD, sleep apnea, and first degree AV block, and OSA on CPAP.   Cardiac catheterization 07/04/2018 revealed nonobstructive coronary artery disease in LAD, circumflex, and right coronary.   Seen by Dr. Katrinka Blazing on 03/18/2022 with increased dyspnea on exertion.  Within the previous 2 to 4 weeks he had recurring episodes of tightness in his chest.  That week he had a prolonged episode of chest pressure that required 3 nitroglycerin tablets to resolve.  EKG at office visit revealed more well-formed inferior Q waves than on previous tracings.  He was not having any chest discomfort at the time of the visit.  Through shared decision making, it was decided to proceed with coronary angiography with possible PCI.    Left heart cath 03/23/2022 revealed diffuse atherosclerosis involving the proximal to mid LAD and tandem 40% stenosis in the proximal and mid segment.  Ostial diagonal #2 contains 70% stenosis.  LM widely patent.  Circumflex and RCA with diffuse atherosclerosis but no focal obstruction.  LAD suggest mild anterior apical hypokinesis, EF 45 to 50%, LVEDP 17 mmHg. Evidence of microvascular dysfunction with low CFR, normal IMR during hyperemia and elevated INR at rest.  Appropriate therapy including ACE inhibitor/statin/antiplatelet therapy with consideration of non-dihydropyridine CCB.   Was feeling well at his visit in November 2023: " with a hx of CAD, HTN, HLD, sleep apnea, and first degree AV block, and OSA on CPAP.   Cardiac catheterization 07/04/2018 revealed nonobstructive  coronary artery disease in LAD, circumflex, and right coronary.   Seen by Dr. Katrinka Blazing on 03/18/2022 with increased dyspnea on exertion.  Within the previous 2 to 4 weeks he had recurring episodes of tightness in his chest.  That week he had a prolonged episode of chest pressure that required 3 nitroglycerin tablets to resolve.  EKG at office visit revealed more well-formed inferior Q waves than on previous tracings.  He was not having any chest discomfort at the time of the visit.  Through shared decision making, it was decided to proceed with coronary angiography with possible PCI.    Left heart cath 03/23/2022 revealed diffuse atherosclerosis involving the proximal to mid LAD and tandem 40% stenosis in the proximal and mid segment.  Ostial diagonal #2 contains 70% stenosis.  LM widely patent.  Circumflex and RCA with diffuse atherosclerosis but no focal obstruction.  LAD suggest mild anterior apical hypokinesis, EF 45 to 50%, LVEDP 17 mmHg. Evidence of microvascular dysfunction with low CFR, normal IMR during hyperemia and elevated INR at rest.  Appropriate therapy including ACE inhibitor/statin/antiplatelet therapy with consideration of non-dihydropyridine CCB. "  Denies :  Dizziness.  Orthopnea. Palpitations. Paroxysmal nocturnal dyspnea. Shortness of breath. Syncope.    Rare chest pain and very rare NTG usage- improved.  Knee pain limits walking and other exertion.   Planning on retiring and joining the [employer] in a few months.   Past Medical History:  Diagnosis Date   Depression    H/O seasonal  allergies    tx. OTC meds   Hyperlipidemia    Hypertension    Prostatitis    Sleep apnea    cpap   Umbilical hernia    resolved -s/p surgical repair    Past Surgical History:  Procedure Laterality Date   APPENDECTOMY     CATARACT EXTRACTION Left    30 yrs ago   COLONOSCOPY      multiple- x1 colon polyp removed   COLONOSCOPY WITH PROPOFOL N/A 10/22/2014   Procedure: COLONOSCOPY WITH PROPOFOL;   Surgeon: Charolett Bumpers, MD;  Location: [facility];  Service: Endoscopy;  Laterality: N/A;   CORONARY PRESSURE/FFR STUDY N/A 03/23/2022   Procedure: INTRAVASCULAR PRESSURE WIRE/FFR STUDY;  Surgeon: Lyn Records, MD;  Location: [facility];  Service: Cardiovascular;  Laterality: N/A;   EYE SURGERY     choriziom removed right eye 12'15   HERNIA REPAIR     umbilical hernia repair   LEFT HEART CATH AND CORONARY ANGIOGRAPHY N/A 07/04/2018   Procedure: LEFT HEART CATH AND CORONARY ANGIOGRAPHY;  Surgeon: Lyn Records, MD;  Location: [facility];  Service: Cardiovascular;  Laterality: N/A;   LEFT HEART CATH AND CORONARY ANGIOGRAPHY N/A 03/23/2022   Procedure: LEFT HEART CATH AND CORONARY ANGIOGRAPHY;  Surgeon: Lyn Records, MD;  Location: [facility];  Service: Cardiovascular;  Laterality: N/A;   TONSILLECTOMY       Current Outpatient Medications  Medication Sig Dispense Refill   Ascorbic Acid (VITAMIN C) 1000 MG tablet Take 1,000 mg by mouth daily.     aspirin 81 MG tablet Take 81 mg by mouth at bedtime.      atorvastatin (LIPITOR) 20 MG tablet Take 20 mg by mouth at bedtime.     benzonatate (TESSALON) 100 MG capsule Take 100 mg by mouth 3 (three) times daily as needed.     carboxymethylcellulose (REFRESH TEARS) 0.5 % SOLN Place 1 drop into both eyes daily.     clopidogrel (PLAVIX) 75 MG tablet Take 1 tablet (75 mg total) by mouth daily with breakfast. 90 tablet 0   cyanocobalamin 1000 MCG tablet Take 1,000 mcg by mouth daily.     doxycycline (VIBRAMYCIN) 100 MG capsule Take 100 mg by mouth 2 (two) times daily.     ezetimibe (ZETIA) 10 MG tablet Take 1 tablet (10 mg total) by mouth daily. (Patient taking differently: Take 10 mg by mouth every evening.) 90 tablet 3   fexofenadine (ALLEGRA) 180 MG tablet Take 180 mg by mouth daily.     FLUoxetine (PROZAC) 20 MG capsule Take 3 capsules (60 mg total) by mouth daily. 270 capsule 3   fluticasone (FLONASE) 50 MCG/ACT nasal  spray Place 1 spray into both nostrils daily.     isosorbide mononitrate (IMDUR) 60 MG 24 hr tablet TAKE 1 TABLET BY MOUTH DAILY 100 tablet 2   losartan (COZAAR) 25 MG tablet Take 25 mg by mouth daily.     Magnesium 250 MG TABS Take 250 mg by mouth daily.     Misc Natural Products (PROSTATE SUPPORT PO) Take 3 capsules by mouth daily.     naproxen (NAPROSYN) 500 MG tablet naproxen 500 mg tablet     nitroGLYCERIN (NITROSTAT) 0.4 MG SL tablet Place 1 tablet (0.4 mg total) under the tongue every 5 (five) minutes as needed for chest pain. 25 tablet 11   ONETOUCH VERIO test strip 1 each daily.     TRULICITY 0.75 MG/0.5ML SOPN Inject 0.75 mg  as directed once a week. Sunday     zinc gluconate 50 MG tablet Take 50 mg by mouth daily.     pyridOXINE (VITAMIN B-6) 100 MG tablet Take 100 mg by mouth daily. (Patient not taking: Reported on 11/16/2022)     No current facility-administered medications for this visit.    Allergies:   Codeine and Metformin and related    Social History:  The patient  reports that he has quit smoking. He quit smokeless tobacco use about 52 years ago. He reports that he does not drink alcohol and does not use drugs.   Family History:  The patient's family history includes Aneurysm in his brother and father; Cancer in his father; Heart disease in his mother.    ROS:  Please see the history of present illness.   Otherwise, review of systems are positive for knee pain.   All other systems are reviewed and negative.    PHYSICAL EXAM: VS:  BP 118/80   Pulse 73   Ht 6' (1.829 m)   Wt 281 lb 12.8 oz (127.8 kg)   SpO2 93%   BMI 38.22 kg/m  , BMI Body mass index is 38.22 kg/m. GEN: Well nourished, well developed, in no acute distress HEENT: normal Neck: no JVD, carotid bruits, or masses Cardiac: RRR; 2/6 early systolic murmur, rubs, or gallops,no edema  Respiratory:  clear to auscultation bilaterally, normal work of breathing GI: soft, nontender, nondistended, + BS MS: no  deformity or atrophy Skin: warm and dry, no rash Neuro:  Strength and sensation are intact Psych: euthymic mood, full affect   EKG:   The ekg ordered 03/2022 demonstrates NSR, prolonged PR,inferior Q waves   Recent Labs: 03/24/2022: ALT 24; BUN 14; Creatinine, Ser 1.19; Hemoglobin 14.3; Platelets 206; Potassium 4.1; Sodium 137   Lipid Panel    Component Value Date/Time   CHOL 187 07/04/2018 0156   TRIG 97 07/04/2018 0156   HDL 47 07/04/2018 0156   CHOLHDL 4.0 07/04/2018 0156   VLDL 19 07/04/2018 0156   LDLCALC 121 (H) 07/04/2018 0156     Other studies Reviewed: Additional studies/ records that were reviewed today with results demonstrating: labs reviewed.   ASSESSMENT AND PLAN:  CAD: Nonobstructive.  Microvascular dysfunction noted with low CFR and normal IMR during hyperemia.  Elevated INR at rest suggestive of microvascular dysfunction and October 2023.  Angina well managed. Rare NTG usage in the past 6 months. Aortic stenosis: By November 2023 echo, still mild.  No syncope.  Echo in a few years or if sx arise. Dilated thoracic aorta: Mild.  HTN: The current medical regimen is effective;  continue present plan and medications. Obesity: Whole food, plant based diet. Hopefully will be able to increase exercise to the target below. Diabetes: A1c 7.0 on Trulicity.  Whole food plant-based diet. High fiber diet.   Current medicines are reviewed at length with the patient today.  The patient concerns regarding his medicines were addressed.  The following changes have been made:  No change  Labs/ tests ordered today include:  No orders of the defined types were placed in this encounter.   Recommend 150 minutes/week of aerobic exercise Low fat, low carb, high fiber diet recommended  Disposition:   FU in 1 year   Signed, Lance Muss, MD  11/16/2022 10:32 AM    [facility] [address] [place], [place], [place]  [address] Phone: [phone]; Fax:  [phone]

## 2022-11-16 NOTE — Patient Instructions (Signed)
Medication Instructions:  Your physician recommends that you continue on your current medications as directed. Please refer to the Current Medication list given to you today.  *If you need a refill on your cardiac medications before your next appointment, please call your pharmacy*   Lab Work: none If you have labs (blood work) drawn today and your tests are completely normal, you will receive your results only by: MyChart Message (if you have MyChart) OR A paper copy in the mail If you have any lab test that is abnormal or we need to change your treatment, we will call you to review the results.   Testing/Procedures: none   Follow-Up: At [facility], you and your health needs are our priority.  As part of our continuing mission to provide you with exceptional heart care, we have created designated Provider Care Teams.  These Care Teams include your primary Cardiologist (physician) and Advanced Practice Providers (APPs -  Physician Assistants and Nurse Practitioners) who all work together to provide you with the care you need, when you need it.  We recommend signing up for the patient portal called "MyChart".  Sign up information is provided on this After Visit Summary.  MyChart is used to connect with patients for Virtual Visits (Telemedicine).  Patients are able to view lab/test results, encounter notes, upcoming appointments, etc.  Non-urgent messages can be sent to your provider as well.   To learn more about what you can do with MyChart, go to [email or web address].    Your next appointment:   12 month(s)  Provider:   Jayadeep Varanasi, MD     Other Instructions    

## 2023-04-27 ENCOUNTER — Encounter: Payer: Self-pay | Admitting: Psychiatry

## 2023-05-09 ENCOUNTER — Telehealth: Payer: Self-pay | Admitting: *Deleted

## 2023-05-09 NOTE — Telephone Encounter (Signed)
   Pre-operative Risk Assessment    Patient Name: Scott Young  DOB: [birth year] MRN: [id number]   Last OV: Dr. Eldridge Dace 11/16/2022 Upcoming OV: None  Request for Surgical Clearance    Procedure:   Colonoscopy  Date of Surgery:  Clearance 06/22/23                                 Surgeon:  Dr. Kathi Der Surgeon's Group or Practice Name:  [facility] Phone number:  [phone] Fax number:  [phone]   Type of Clearance Requested:   - Medical  - Pharmacy:  Hold Aspirin and Clopidogrel (Plavix) 5 days prior.   Type of Anesthesia:   Propofol   Additional requests/questions:    Signed, Emmit Pomfret   05/09/2023, 1:14 PM

## 2023-05-10 NOTE — Telephone Encounter (Signed)
Vm is full. Pt needs a tele pre op appt per pre op APP today to schedule after the new year.

## 2023-05-10 NOTE — Telephone Encounter (Signed)
   Name: Scott Young  DOB: 1950/09/26  MRN: [id number]  Primary Cardiologist: Lance Muss, MD  Chart reviewed as part of pre-operative protocol coverage. Because of [name] past medical history and time since last visit, he will require a follow-up telephone visit in order to better assess preoperative cardiovascular risk.  Pre-op covering staff: - Please schedule appointment and call patient to inform them. If patient already had an upcoming appointment within acceptable timeframe, please add "pre-op clearance" to the appointment notes so provider is aware. - Please contact requesting surgeon's office via preferred method (i.e, phone, fax) to inform them of need for appointment prior to surgery.  He can hold his Plavix x 5 days prior to the procedure but would prefer continuing asa  throughout.  Sharlene Dory, PA-C  05/10/2023, 7:55 AM

## 2023-05-16 NOTE — Telephone Encounter (Addendum)
Tried x 2 to reach the pt to set up tele appt, though vm is full could not leave message to call back. I will now update the requesting office the pt will need a tele appt before he can be cleared.

## 2023-05-18 ENCOUNTER — Telehealth: Payer: Self-pay | Admitting: *Deleted

## 2023-05-18 NOTE — Telephone Encounter (Signed)
Pt has been scheduled tele pre op appt 06/14/23 for procedure 06/22/23 and med hold x 5 days Plavxi. Med rec and consent are done.      Patient Consent for Virtual Visit        Scott Young has provided verbal consent on 05/18/2023 for a virtual visit (video or telephone).   CONSENT FOR VIRTUAL VISIT FOR:  Scott Young  By participating in this virtual visit I agree to the following:  I hereby voluntarily request, consent and authorize [facility] and its employed or contracted physicians, physician assistants, nurse practitioners or other licensed health care professionals (the Practitioner), to provide me with telemedicine health care services (the "Services") as deemed necessary by the treating Practitioner. I acknowledge and consent to receive the Services by the Practitioner via telemedicine. I understand that the telemedicine visit will involve communicating with the Practitioner through live audiovisual communication technology and the disclosure of certain medical information by electronic transmission. I acknowledge that I have been given the opportunity to request an in-person assessment or other available alternative prior to the telemedicine visit and am voluntarily participating in the telemedicine visit.  I understand that I have the right to withhold or withdraw my consent to the use of telemedicine in the course of my care at any time, without affecting my right to future care or treatment, and that the Practitioner or I may terminate the telemedicine visit at any time. I understand that I have the right to inspect all information obtained and/or recorded in the course of the telemedicine visit and may receive copies of available information for a reasonable fee.  I understand that some of the potential risks of receiving the Services via telemedicine include:  Delay or interruption in medical evaluation due to technological equipment failure or disruption; Information  transmitted may not be sufficient (e.g. poor resolution of images) to allow for appropriate medical decision making by the Practitioner; and/or  In rare instances, security protocols could fail, causing a breach of personal health information.  Furthermore, I acknowledge that it is my responsibility to provide information about my medical history, conditions and care that is complete and accurate to the best of my ability. I acknowledge that Practitioner's advice, recommendations, and/or decision may be based on factors not within their control, such as incomplete or inaccurate data provided by me or distortions of diagnostic images or specimens that may result from electronic transmissions. I understand that the practice of medicine is not an exact science and that Practitioner makes no warranties or guarantees regarding treatment outcomes. I acknowledge that a copy of this consent can be made available to me via my patient portal [facility] MyChart), or I can request a printed copy by calling the office of [facility].    I understand that my insurance will be billed for this visit.   I have read or had this consent read to me. I understand the contents of this consent, which adequately explains the benefits and risks of the Services being provided via telemedicine.  I have been provided ample opportunity to ask questions regarding this consent and the Services and have had my questions answered to my satisfaction. I give my informed consent for the services to be provided through the use of telemedicine in my medical care

## 2023-05-18 NOTE — Telephone Encounter (Signed)
  Pt is returning call, he said, the home number on file is an old and not a working number. He said, to call him back at [phone]

## 2023-05-18 NOTE — Telephone Encounter (Signed)
Pt has been scheduled tele pre op appt 06/14/23 for procedure 06/22/23 and med hold x 5 days Plavxi. Med rec and consent are done.

## 2023-05-18 NOTE — Telephone Encounter (Signed)
3rd attempt. Pt's VM is full could not leave message to call back. I will update the requesting office the pt needs to call our office to schedule a televisit for pre op clearance.   I will remove from the pre op call back pool until the pt calls back.

## 2023-06-07 ENCOUNTER — Other Ambulatory Visit: Payer: Self-pay | Admitting: Psychiatry

## 2023-06-07 DIAGNOSIS — F32A Depression, unspecified: Secondary | ICD-10-CM

## 2023-06-14 ENCOUNTER — Ambulatory Visit: Payer: Medicare Other | Attending: Physician Assistant

## 2023-06-14 DIAGNOSIS — Z01810 Encounter for preprocedural cardiovascular examination: Secondary | ICD-10-CM

## 2023-06-14 NOTE — Progress Notes (Signed)
 Virtual Visit via Telephone Note   Because of Monique Arakawa's co-morbid illnesses, he is at least at moderate risk for complications without adequate follow up.  This format is felt to be most appropriate for this patient at this time.  The patient did not have access to video technology/had technical difficulties with video requiring transitioning to audio format only (telephone).  All issues noted in this document were discussed and addressed.  No physical exam could be performed with this format.  Please refer to the patient's chart for his consent to telehealth for [facility].  Evaluation Performed:  Preoperative cardiovascular risk assessment _____________   Date:  06/14/2023   Patient ID:  Scott Young, DOB 1950-11-02, MRN [id number] Patient Location:  Home Provider location:   Office  Primary Care Provider:  Rexanne Ingle, MD Primary Cardiologist:  Candyce Reek, MD  Chief Complaint / Patient Profile   72 y.o. y/o male with a h/o CAD, HTN, HLD, sleep apnea, and first-degree AV block who is pending colonoscopy and presents today for telephonic preoperative cardiovascular risk assessment.  History of Present Illness    Scott Young is a 72 y.o. male who presents via audio/video conferencing for a telehealth visit today.  Pt was last seen in cardiology clinic on 11/16/22 by Dr. Reek.  At that time Scott Young was doing well.  The patient is now pending procedure as outlined above. Since his last visit, he has been doing pretty good overall.  At times, he does endorse some pressure and needs to use 1 nitroglycerin  tab and usually his symptoms subside.  This has happened about 3 or 4 times since he saw Dr. Reek back in June.  He states that the episodes are accompanied with weakness and nausea.  This is nothing new and has been happening for a while now.  He was diagnosed with microvascular disease and is on long-acting nitrates.  He states that he climbs stairs  multiple times a day without issue and has been moving since September which requires lifting heavy boxes and unpacking.  He lives in a townhome and takes care of his patio and maintenance is his patio furniture.  He has not had any symptoms with this activity.  For that reason, he has exceeded a 4 METS on the DASI.  He can hold his Plavix  x 5 days prior to the procedure but would prefer continuing asa  throughout.   We discussed referral to Dr. Wendel for microvascular disease and establishing care.  I will send a message to get an appointment for him in the next few months.   Past Medical History    Past Medical History:  Diagnosis Date   Depression    H/O seasonal allergies    tx. OTC meds   Hyperlipidemia    Hypertension    Prostatitis    Sleep apnea    cpap   Umbilical hernia    resolved -s/p surgical repair   Past Surgical History:  Procedure Laterality Date   APPENDECTOMY     CATARACT EXTRACTION Left    30 yrs ago   COLONOSCOPY      multiple- x1 colon polyp removed   COLONOSCOPY WITH PROPOFOL  N/A 10/22/2014   Procedure: COLONOSCOPY WITH PROPOFOL ;  Surgeon: Gladis MARLA Louder, MD;  Location: [facility];  Service: Endoscopy;  Laterality: N/A;   CORONARY PRESSURE/FFR STUDY N/A 03/23/2022   Procedure: INTRAVASCULAR PRESSURE WIRE/FFR STUDY;  Surgeon: Claudene Victory ORN, MD;  Location: [facility];  Service: Cardiovascular;  Laterality: N/A;   EYE SURGERY     choriziom removed right eye 12'15   HERNIA REPAIR     umbilical hernia repair   LEFT HEART CATH AND CORONARY ANGIOGRAPHY N/A 07/04/2018   Procedure: LEFT HEART CATH AND CORONARY ANGIOGRAPHY;  Surgeon: Claudene Victory ORN, MD;  Location: [facility];  Service: Cardiovascular;  Laterality: N/A;   LEFT HEART CATH AND CORONARY ANGIOGRAPHY N/A 03/23/2022   Procedure: LEFT HEART CATH AND CORONARY ANGIOGRAPHY;  Surgeon: Claudene Victory ORN, MD;  Location: [facility];  Service: Cardiovascular;  Laterality: N/A;    TONSILLECTOMY      Allergies  Allergies  Allergen Reactions   Codeine Nausea And Vomiting    Other Reaction(s): GI Intolerance   Metformin And Related Diarrhea and Nausea And Vomiting    Home Medications    Prior to Admission medications   Medication Sig Start Date End Date Taking? Authorizing Provider  Ascorbic Acid (VITAMIN C) 1000 MG tablet Take 1,000 mg by mouth daily.    [provider]  aspirin  81 MG tablet Take 81 mg by mouth at bedtime.     [provider]  atorvastatin  (LIPITOR) 20 MG tablet Take 20 mg by mouth at bedtime. 12/19/20   [provider]  benzonatate (TESSALON) 100 MG capsule Take 100 mg by mouth 3 (three) times daily as needed. Patient not taking: Reported on 05/18/2023 09/15/22   [provider]  carboxymethylcellulose (REFRESH TEARS) 0.5 % SOLN Place 1 drop into both eyes daily.    [provider]  Cholecalciferol (D3) 25 MCG (1000 UT) capsule Take 1,000 Units by mouth daily.    [provider]  clopidogrel  (PLAVIX ) 75 MG tablet TAKE 1 TABLET BY MOUTH DAILY  WITH BREAKFAST 11/16/22   Dann Candyce RAMAN, MD  Cranberry 125 MG TABS Take 1 tablet by mouth daily at 12 noon.    [provider]  cyanocobalamin 1000 MCG tablet Take 1,000 mcg by mouth daily.    [provider]  doxycycline (VIBRAMYCIN) 100 MG capsule Take 100 mg by mouth 2 (two) times daily. Patient not taking: Reported on 05/18/2023 09/15/22   [provider]  ezetimibe  (ZETIA ) 10 MG tablet Take 1 tablet (10 mg total) by mouth daily. Patient taking differently: Take 10 mg by mouth every evening. 10/24/18   Claudene Victory ORN, MD  fexofenadine (ALLEGRA) 180 MG tablet Take 180 mg by mouth daily.    [provider]  FLUoxetine  (PROZAC ) 20 MG capsule TAKE 3 CAPSULES BY MOUTH DAILY 06/10/23   Franchot Raisin, PMHNP  fluticasone  (FLONASE ) 50 MCG/ACT nasal spray Place 1 spray into both nostrils daily. 09/04/15   [provider]  isosorbide  mononitrate (IMDUR ) 60 MG 24 hr tablet TAKE 1 TABLET BY MOUTH DAILY 10/14/22   Swinyer, Rosaline HERO, NP  losartan  (COZAAR ) 25 MG tablet Take 25 mg by mouth daily. 09/02/15   [provider]  Magnesium 250 MG TABS Take 250 mg by mouth daily.    [provider]  Misc Natural Products (PROSTATE SUPPORT PO) Take 3 capsules by mouth daily.    [provider]  naproxen (NAPROSYN) 500 MG tablet naproxen 500 mg tablet Patient not taking: Reported on 05/18/2023 06/30/18   [provider]  nitroGLYCERIN  (NITROSTAT ) 0.4 MG SL tablet Place 1 tablet (0.4 mg total) under the tongue every 5 (five) minutes as needed for chest pain. Patient not taking: Reported on 05/18/2023 04/07/22   Claudene Victory  W, MD  [facility] VERIO test strip 1 each daily. 09/09/20   [provider]  pyridOXINE (VITAMIN B-6) 100 MG tablet Take 100 mg by mouth daily. Patient not taking: Reported on 11/16/2022    [provider]  TRULICITY 0.75 MG/0.5ML SOPN Inject 0.75 mg as directed once a week. Sunday 07/15/21   [provider]  zinc gluconate 50 MG tablet Take 50 mg by mouth daily. Patient not taking: Reported on 05/18/2023    [provider]    Physical Exam    Vital Signs:  Scott Young does not have vital signs available for review today.  Given telephonic nature of communication, physical exam is limited. AAOx3. NAD. Normal affect.  Speech and respirations are unlabored.  Accessory Clinical Findings    None  Assessment & Plan    1.  Preoperative Cardiovascular Risk Assessment:  Scott Young perioperative risk of a major cardiac event is 0.9% according to the Revised Cardiac Risk Index (RCRI).  Therefore, he is at low risk for perioperative complications.   His functional capacity is good at 6.05 METs according to the [facility] Activity Status Index (DASI). Recommendations: According to ACC/AHA guidelines, no further cardiovascular testing needed.  The  patient may proceed to surgery at acceptable risk.   Antiplatelet and/or Anticoagulation Recommendations: The patient should remain on Aspirin  without interruption.   Clopidogrel  (Plavix ) can be held for 5 days prior to his surgery and resumed as soon as possible post op.  A copy of this note will be routed to requesting surgeon.  Time:   Today, I have spent 14 minutes with the patient with telehealth technology discussing medical history, symptoms, and management plan.     Scott LOISE Fabry, PA-C  06/14/2023, 8:23 AM

## 2023-06-22 DIAGNOSIS — D122 Benign neoplasm of ascending colon: Secondary | ICD-10-CM | POA: Diagnosis not present

## 2023-06-22 DIAGNOSIS — Z800 Family history of malignant neoplasm of digestive organs: Secondary | ICD-10-CM | POA: Diagnosis not present

## 2023-06-22 DIAGNOSIS — K5730 Diverticulosis of large intestine without perforation or abscess without bleeding: Secondary | ICD-10-CM | POA: Diagnosis not present

## 2023-06-24 DIAGNOSIS — D122 Benign neoplasm of ascending colon: Secondary | ICD-10-CM | POA: Diagnosis not present

## 2023-07-01 ENCOUNTER — Other Ambulatory Visit: Payer: Self-pay | Admitting: Nurse Practitioner

## 2023-07-01 ENCOUNTER — Other Ambulatory Visit: Payer: Self-pay | Admitting: Interventional Cardiology

## 2023-07-01 ENCOUNTER — Telehealth: Payer: Self-pay | Admitting: Nurse Practitioner

## 2023-07-01 NOTE — Telephone Encounter (Signed)
  Per MyChart scheduling message:     Pt c/o of Chest Pain: STAT if active CP, including tightness, pressure, jaw pain, radiating pain to shoulder/upper arm/back, CP unrelieved by Nitro. Symptoms reported of SOB, nausea, vomiting, sweating.  1. Are you having CP right now?    2. Are you experiencing any other symptoms (ex. SOB, nausea, vomiting, sweating)?   3. Is your CP continuous or coming and going?   4. Have you taken Nitroglycerin?   5. How long have you been experiencing CP?    6. If NO CP at time of call then end call with telling Pt to call back or call 911 if Chest pain returns prior to return call from triage team.    I was having chest pressure when I made the appointment request. I have been having this off and on for quite a while. The time I made my request chest pressure has subsided. Often when I have it, I also have some nausea and weakness. I may have these episodes two or three times during the course of a week. Usually there is no pain, but chest pressure, nausea and weakness. I am not having chest pressure right now. I have been wondering if the 60 mg of isosorbide is enough?

## 2023-07-01 NOTE — Telephone Encounter (Signed)
Spoke with the patient who reports that he has been having more chest pressure over the last few months. He states that he has had to take nitroglycerin only about 4-5 times since he was last seen. He does report that he gets nauseous sometimes. He reports that his symptoms are the same as they have always been. He does have microvascular disease and takes Imdur 60 mg daily. He has previously seen Dr. Eldridge Dace but has not had been established with a new cardiologist. I have scheduled him to come in next week to see an APP for evaluation.

## 2023-07-04 NOTE — Progress Notes (Unsigned)
Cardiology Office Note    Date:  07/05/2023  ID:  Scott Young, DOB 09/18/1950, MRN [id number] PCP:  Renford Dills, MD  Cardiologist:  Lance Muss, MD  Electrophysiologist:  None   Chief Complaint: chest pressure  History of Present Illness: Scott Young is a 73 y.o. 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 Prior cardiac testing 2017 reassuring. He then had cath in 2020 showing mild-moderate coronary artery disease recommended for medical therapy. Repeat cath 03/2022 (troponin bump at that time to 26) had shown no progression of epicardial CAD (tandem 40% prox and mid LAD, 70% D2, diffuse RCA/Cx without focal obstruction), but question of microvascular disease ("coronary microvascular disease study demonstrated CFR 1.5 and IMR of 20 at peak hyperemia/40 at rest. There may be some technical issues because the last shot at completion of the study demonstrated that the guide catheter had migrated out of the ostium of the left main. This could influence both CFR and IMR." LVEF was 45-50% by cath. F/u echo 04/2022 showed EF 60-65%, G1DD, elevated LVEDP, mildly enlarged RV, moderate AS, mild dilation of ascending aorta. He had a preop colonoscopy telephone visit in December and colonoscopy went uneventfully per his report, one adenomatous polyp (routine screening).  He returns for follow-up seeking earlier visit due to progressively worsening chest discomfort over the last 3-4 months. This seems worse than his prior baseline angina. It is now happening about 2-3 times a week, described as pressure, sometimes occurring with activity, other times without provocation. Sometimes he is able to move about without any provocation of the symptoms. He has also woken up with it before. He had an episode on Sunday that lasted several hours.  He did not wish to proceed to the hospital. He took a SL NTG and it very gradually eased off. He feels fine today. When he gets these episodes he sometimes feels weak, nauseated, and as if he cannot fully take a deep breath. He reports his previous chest pain was a more focal central pain; this is more tightness/pressure.  Labwork independently reviewed: [facility] 04/2023 HDL 53, LDL 56, trig 125, Z3Y 6.6, Hgb 14.4, K 4.3, Cr not listed, TSH 1.580, ALT wnl 03/2022 K 4.1, Cr 1.19, LFTs ok, CBC OK 2010 LDL 121  ROS: .    Please see the history of present illness.  All other systems are reviewed and otherwise negative.  Studies Reviewed: Marland Kitchen    EKG:  EKG is ordered today, personally reviewed, demonstrating NSR 68 first degree AVB, no acute STTW changes from prior  CV Studies: Cardiac studies reviewed are outlined and summarized above. Otherwise please see EMR for full report.   Current Reported Medications:.    Current Meds  Medication Sig   aspirin 81 MG tablet Take 81 mg by mouth at bedtime.    atorvastatin (LIPITOR) 20 MG tablet Take 20 mg by mouth at bedtime.   carboxymethylcellulose (REFRESH TEARS) 0.5 % SOLN Place 1 drop into both eyes daily.   clopidogrel (PLAVIX) 75 MG tablet TAKE 1 TABLET BY MOUTH DAILY  WITH BREAKFAST   ezetimibe (ZETIA) 10 MG tablet Take 1 tablet (10 mg total) by mouth daily. (Patient taking differently: Take 10 mg by mouth every evening.)   fexofenadine (ALLEGRA) 180 MG tablet Take 180 mg by mouth daily.   FLUoxetine (PROZAC) 20 MG capsule TAKE 3 CAPSULES  BY MOUTH DAILY   fluticasone (FLONASE) 50 MCG/ACT nasal spray Place 1 spray into both nostrils daily.   isosorbide mononitrate (IMDUR) 60 MG 24 hr tablet TAKE 1 TABLET BY MOUTH DAILY   losartan (COZAAR) 25 MG tablet Take 25 mg by mouth daily.   Misc Natural Products (PROSTATE SUPPORT PO) Take 3 capsules by mouth daily.   nitroGLYCERIN (NITROSTAT) 0.4 MG SL tablet Place 1 tablet (0.4 mg total) under the tongue  every 5 (five) minutes as needed for chest pain.   ONETOUCH VERIO test strip 1 each daily.   TRULICITY 0.75 MG/0.5ML SOPN Inject 0.75 mg as directed once a week. Sunday    Physical Exam:    VS:  BP 120/76 (BP Location: Left Arm, Patient Position: Sitting, Cuff Size: Normal)   Pulse 71   Resp 16   Ht 6' (1.829 m)   Wt 278 lb (126.1 kg)   SpO2 97%   BMI 37.70 kg/m    Wt Readings from Last 3 Encounters:  07/05/23 278 lb (126.1 kg)  11/16/22 281 lb 12.8 oz (127.8 kg)  04/19/22 277 lb 9.6 oz (125.9 kg)    GEN: Well nourished, well developed in no acute distress NECK: No JVD; No carotid bruits CARDIAC: RRR, 2/6 SEM. Quiet but preserved S2. No rubs or gallops RESPIRATORY:  Clear to auscultation without rales, wheezing or rhonchi  ABDOMEN: Soft, non-tender, non-distended EXTREMITIES:  No edema; No acute deformity   Asessement and Plan:.    1. Chest pain, CAD, HLD - discussed with Dr. Jacinto Halim, discussed cardiac PET vs cath vs intensification of medical therapy. Patient concerned about progression of symptoms from his previous chest pain. Difficult to know if this represents progression of underlying epicardial CAD, microvascular angina, aortic stenosis or noncardiac process. Per shared decision making patient would like to proceed with repeat cardiac catheterization for definitive assessment. Will expedite this week. Per Dr. Jacinto Halim will plan right and left heart cath to also eval the AS as well. Update echocardiogram. Will check pre-cath CBC, BMET, Lipids/TSH were reassuring in 04/2023 by [facility]. Continue ASA, Plavix, atorvastatin, ezetimibe, isosorbide at present doses. Hold off titration of nitrate in case AS has worsened. He is not tachycardic, tachypneic or hypoxic. We also discussed return/ER precautions. Will get stat troponin today given episode 2 days ago - if this is abnormal he should be instructed to proceed to ED. If abnormal will continue plan for outpatient cath. Will s/o level to on call  APP if not resulted by 5pm.  Informed Consent   Shared Decision Making/Informed Consent The risks [stroke (1 in 1000), death (1 in 1000), kidney failure [usually temporary] (1 in 500), bleeding (1 in 200), allergic reaction [possibly serious] (1 in 200)], benefits (diagnostic support and management of coronary artery disease) and alternatives of a cardiac catheterization were discussed in detail with Mr. Easterlin and he is willing to proceed.    2. Moderate AS - R/LHC as above. Update echocardiogram.  3. Mildly dilated aorta - will reassess by echocardiogram as above.   4. HTN - BP controlled, no acute med changes made today.  5. First degree AVB - stable.    Disposition: F/u with me 3-4 weeks after cath.  Signed, Laurann Montana, PA-C

## 2023-07-04 NOTE — H&P (View-Only) (Signed)
Cardiology Office Note    Date:  07/05/2023  ID:  Scott Young, DOB Jan 18, 1951, MRN [id number] PCP:  Renford Dills, MD  Cardiologist:  Lance Muss, MD  Electrophysiologist:  None   Chief Complaint: chest pressure  History of Present Illness: Scott Young is a 73 y.o. 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 Prior cardiac testing 2017 reassuring. He then had cath in 2020 showing mild-moderate coronary artery disease recommended for medical therapy. Repeat cath 03/2022 (troponin bump at that time to 26) had shown no progression of epicardial CAD (tandem 40% prox and mid LAD, 70% D2, diffuse RCA/Cx without focal obstruction), but question of microvascular disease ("coronary microvascular disease study demonstrated CFR 1.5 and IMR of 20 at peak hyperemia/40 at rest. There may be some technical issues because the last shot at completion of the study demonstrated that the guide catheter had migrated out of the ostium of the left main. This could influence both CFR and IMR." LVEF was 45-50% by cath. F/u echo 04/2022 showed EF 60-65%, G1DD, elevated LVEDP, mildly enlarged RV, moderate AS, mild dilation of ascending aorta. He had a preop colonoscopy telephone visit in December and colonoscopy went uneventfully per his report, one adenomatous polyp (routine screening).  He returns for follow-up seeking earlier visit due to progressively worsening chest discomfort over the last 3-4 months. This seems worse than his prior baseline angina. It is now happening about 2-3 times a week, described as pressure, sometimes occurring with activity, other times without provocation. Sometimes he is able to move about without any provocation of the symptoms. He has also woken up with it before. He had an episode on Sunday that lasted several hours.  He did not wish to proceed to the hospital. He took a SL NTG and it very gradually eased off. He feels fine today. When he gets these episodes he sometimes feels weak, nauseated, and as if he cannot fully take a deep breath. He reports his previous chest pain was a more focal central pain; this is more tightness/pressure.  Labwork independently reviewed: [facility] 04/2023 HDL 53, LDL 56, trig 125, Y8M 6.6, Hgb 14.4, K 4.3, Cr not listed, TSH 1.580, ALT wnl 03/2022 K 4.1, Cr 1.19, LFTs ok, CBC OK 2010 LDL 121  ROS: .    Please see the history of present illness.  All other systems are reviewed and otherwise negative.  Studies Reviewed: Marland Kitchen    EKG:  EKG is ordered today, personally reviewed, demonstrating NSR 68 first degree AVB, no acute STTW changes from prior  CV Studies: Cardiac studies reviewed are outlined and summarized above. Otherwise please see EMR for full report.   Current Reported Medications:.    Current Meds  Medication Sig   aspirin 81 MG tablet Take 81 mg by mouth at bedtime.    atorvastatin (LIPITOR) 20 MG tablet Take 20 mg by mouth at bedtime.   carboxymethylcellulose (REFRESH TEARS) 0.5 % SOLN Place 1 drop into both eyes daily.   clopidogrel (PLAVIX) 75 MG tablet TAKE 1 TABLET BY MOUTH DAILY  WITH BREAKFAST   ezetimibe (ZETIA) 10 MG tablet Take 1 tablet (10 mg total) by mouth daily. (Patient taking differently: Take 10 mg by mouth every evening.)   fexofenadine (ALLEGRA) 180 MG tablet Take 180 mg by mouth daily.   FLUoxetine (PROZAC) 20 MG capsule TAKE 3 CAPSULES  BY MOUTH DAILY   fluticasone (FLONASE) 50 MCG/ACT nasal spray Place 1 spray into both nostrils daily.   isosorbide mononitrate (IMDUR) 60 MG 24 hr tablet TAKE 1 TABLET BY MOUTH DAILY   losartan (COZAAR) 25 MG tablet Take 25 mg by mouth daily.   Misc Natural Products (PROSTATE SUPPORT PO) Take 3 capsules by mouth daily.   nitroGLYCERIN (NITROSTAT) 0.4 MG SL tablet Place 1 tablet (0.4 mg total) under the tongue  every 5 (five) minutes as needed for chest pain.   ONETOUCH VERIO test strip 1 each daily.   TRULICITY 0.75 MG/0.5ML SOPN Inject 0.75 mg as directed once a week. Sunday    Physical Exam:    VS:  BP 120/76 (BP Location: Left Arm, Patient Position: Sitting, Cuff Size: Normal)   Pulse 71   Resp 16   Ht 6' (1.829 m)   Wt 278 lb (126.1 kg)   SpO2 97%   BMI 37.70 kg/m    Wt Readings from Last 3 Encounters:  07/05/23 278 lb (126.1 kg)  11/16/22 281 lb 12.8 oz (127.8 kg)  04/19/22 277 lb 9.6 oz (125.9 kg)    GEN: Well nourished, well developed in no acute distress NECK: No JVD; No carotid bruits CARDIAC: RRR, 2/6 SEM. Quiet but preserved S2. No rubs or gallops RESPIRATORY:  Clear to auscultation without rales, wheezing or rhonchi  ABDOMEN: Soft, non-tender, non-distended EXTREMITIES:  No edema; No acute deformity   Asessement and Plan:.    1. Chest pain, CAD, HLD - discussed with Dr. Jacinto Halim, discussed cardiac PET vs cath vs intensification of medical therapy. Patient concerned about progression of symptoms from his previous chest pain. Difficult to know if this represents progression of underlying epicardial CAD, microvascular angina, aortic stenosis or noncardiac process. Per shared decision making patient would like to proceed with repeat cardiac catheterization for definitive assessment. Will expedite this week. Per Dr. Jacinto Halim will plan right and left heart cath to also eval the AS as well. Update echocardiogram. Will check pre-cath CBC, BMET, Lipids/TSH were reassuring in 04/2023 by [facility]. Continue ASA, Plavix, atorvastatin, ezetimibe, isosorbide at present doses. Hold off titration of nitrate in case AS has worsened. He is not tachycardic, tachypneic or hypoxic. We also discussed return/ER precautions. Will get stat troponin today given episode 2 days ago - if this is abnormal he should be instructed to proceed to ED. If abnormal will continue plan for outpatient cath. Will s/o level to on call  APP if not resulted by 5pm.  Informed Consent   Shared Decision Making/Informed Consent The risks [stroke (1 in 1000), death (1 in 1000), kidney failure [usually temporary] (1 in 500), bleeding (1 in 200), allergic reaction [possibly serious] (1 in 200)], benefits (diagnostic support and management of coronary artery disease) and alternatives of a cardiac catheterization were discussed in detail with Mr. Yehl and he is willing to proceed.    2. Moderate AS - R/LHC as above. Update echocardiogram.  3. Mildly dilated aorta - will reassess by echocardiogram as above.   4. HTN - BP controlled, no acute med changes made today.  5. First degree AVB - stable.    Disposition: F/u with me 3-4 weeks after cath.  Signed, Laurann Montana, PA-C

## 2023-07-05 ENCOUNTER — Ambulatory Visit: Payer: Medicare Other | Attending: Physician Assistant | Admitting: Physician Assistant

## 2023-07-05 ENCOUNTER — Encounter: Payer: Self-pay | Admitting: *Deleted

## 2023-07-05 ENCOUNTER — Encounter: Payer: Self-pay | Admitting: Physician Assistant

## 2023-07-05 ENCOUNTER — Telehealth: Payer: Self-pay | Admitting: Physician Assistant

## 2023-07-05 VITALS — BP 120/76 | HR 71 | Resp 16 | Ht 72.0 in | Wt 278.0 lb

## 2023-07-05 DIAGNOSIS — E785 Hyperlipidemia, unspecified: Secondary | ICD-10-CM

## 2023-07-05 DIAGNOSIS — I440 Atrioventricular block, first degree: Secondary | ICD-10-CM | POA: Diagnosis not present

## 2023-07-05 DIAGNOSIS — I350 Nonrheumatic aortic (valve) stenosis: Secondary | ICD-10-CM

## 2023-07-05 DIAGNOSIS — I209 Angina pectoris, unspecified: Secondary | ICD-10-CM

## 2023-07-05 DIAGNOSIS — I10 Essential (primary) hypertension: Secondary | ICD-10-CM | POA: Diagnosis not present

## 2023-07-05 DIAGNOSIS — Z01810 Encounter for preprocedural cardiovascular examination: Secondary | ICD-10-CM

## 2023-07-05 DIAGNOSIS — R079 Chest pain, unspecified: Secondary | ICD-10-CM

## 2023-07-05 DIAGNOSIS — I77819 Aortic ectasia, unspecified site: Secondary | ICD-10-CM

## 2023-07-05 DIAGNOSIS — I25119 Atherosclerotic heart disease of native coronary artery with unspecified angina pectoris: Secondary | ICD-10-CM | POA: Diagnosis not present

## 2023-07-05 DIAGNOSIS — I2510 Atherosclerotic heart disease of native coronary artery without angina pectoris: Secondary | ICD-10-CM

## 2023-07-05 LAB — TROPONIN T: Troponin T (Highly Sensitive): 8 ng/L (ref 0–22)

## 2023-07-05 NOTE — Telephone Encounter (Signed)
Spoke with patient and informed him he did not need to complete echo prior to heart cath scheduled on 1/23. Patient verbalized understanding and expressed appreciation for call.

## 2023-07-05 NOTE — Telephone Encounter (Signed)
Pt wants to know if its ok to have his echo done after his cath or was it supposed to be scheduled before. Please advise

## 2023-07-05 NOTE — Patient Instructions (Addendum)
Medication Instructions:   Your physician recommends that you continue on your current medications as directed. Please refer to the Current Medication list given to you today.   *If you need a refill on your cardiac medications before your next appointment, please call your pharmacy*   Lab Work:   [facility]  FIRST FLOOR  [address] :  BMET AND CBC AND TROPONIN  TODAY     If you have labs (blood work) drawn today and your tests are completely normal, you will receive your results only by: MyChart Message (if you have MyChart) OR A paper copy in the mail If you have any lab test that is abnormal or we need to change your treatment, we will call you to review the results.   Testing/Procedures:   ON 07-07-23 Your physician has requested that you have a cardiac catheterization. Cardiac catheterization is used to diagnose and/or treat various heart conditions. Doctors may recommend this procedure for a number of different reasons. The most common reason is to evaluate chest pain. Chest pain can be a symptom of coronary artery disease (CAD), and cardiac catheterization can show whether plaque is narrowing or blocking your heart's arteries. This procedure is also used to evaluate the valves, as well as measure the blood flow and oxygen levels in different parts of your heart. For further information please visit [email or web address] Please follow instruction sheet, as given.   Your physician has requested that you have an echocardiogram. Echocardiography is a painless test that uses sound waves to create images of your heart. It provides your doctor with information about the size and shape of your heart and how well your heart's chambers and valves are working. This procedure takes approximately one hour. There are no restrictions for this procedure. Please do NOT wear cologne, perfume, aftershave, or lotions (deodorant is allowed). Please arrive 15 minutes prior to your  appointment time.  Please note: We ask at that you not bring children with you during ultrasound (echo/ vascular) testing. Due to room size and safety concerns, children are not allowed in the ultrasound rooms during exams. Our front office staff cannot provide observation of children in our lobby area while testing is being conducted. An adult accompanying a patient to their appointment will only be allowed in the ultrasound room at the discretion of the ultrasound technician under special circumstances. We apologize for any inconvenience.     Follow-Up: At [facility], you and your health needs are our priority.  As part of our continuing mission to provide you with exceptional heart care, we have created designated Provider Care Teams.  These Care Teams include your primary Cardiologist (physician) and Advanced Practice Providers (APPs -  Physician Assistants and Nurse Practitioners) who all work together to provide you with the care you need, when you need it.  We recommend signing up for the patient portal called "MyChart".  Sign up information is provided on this After Visit Summary.  MyChart is used to connect with patients for Virtual Visits (Telemedicine).  Patients are able to view lab/test results, encounter notes, upcoming appointments, etc.  Non-urgent messages can be sent to your provider as well.   To learn more about what you can do with MyChart, go to [email or web address].    Your next appointment:   AFTER 07-07-23  POST CATH   3-4  week(s)  Provider:  Ronie Spies PA-C     Other Instructions

## 2023-07-06 ENCOUNTER — Telehealth: Payer: Self-pay | Admitting: *Deleted

## 2023-07-06 LAB — BASIC METABOLIC PANEL
BUN/Creatinine Ratio: 14 (ref 10–24)
BUN: 17 mg/dL (ref 8–27)
CO2: 25 mmol/L (ref 20–29)
Calcium: 9.3 mg/dL (ref 8.6–10.2)
Chloride: 103 mmol/L (ref 96–106)
Creatinine, Ser: 1.21 mg/dL (ref 0.76–1.27)
Glucose: 91 mg/dL (ref 70–99)
Potassium: 4.7 mmol/L (ref 3.5–5.2)
Sodium: 142 mmol/L (ref 134–144)
eGFR: 63 mL/min/{1.73_m2} (ref >59)

## 2023-07-06 LAB — CBC
Hematocrit: 43.6 % (ref 37.5–51.0)
Hemoglobin: 13.9 g/dL (ref 13.0–17.7)
MCH: 30.2 pg (ref 26.6–33.0)
MCHC: 31.9 g/dL (ref 31.5–35.7)
MCV: 95 fL (ref 79–97)
Platelets: 199 10*3/uL (ref 150–450)
RBC: 4.61 x10E6/uL (ref 4.14–5.80)
RDW: 13 % (ref 11.6–15.4)
WBC: 6 10*3/uL (ref 3.4–10.8)

## 2023-07-06 NOTE — Telephone Encounter (Signed)
Cardiac Catheterization scheduled at [facility] for: Thursday July 07, 2023 12:30 PM Arrival time [facility] Main Entrance A at: 10:30 AM  Nothing to eat after midnight prior to procedure, clear liquids until 5 AM day of procedure.  Medication instructions: -Usual morning medications can be taken with sips of water including aspirin 81 mg and Plavix 75 mg  Patient reports last dose of weekly Trulicity was 07/03/23.  Plan to go home the same day, you will only stay overnight if medically necessary.  You must have responsible adult to drive you home.  Someone must be with you the first 24 hours after you arrive home.  Reviewed procedure instructions with patient.

## 2023-07-07 ENCOUNTER — Ambulatory Visit (HOSPITAL_COMMUNITY)
Admission: RE | Admit: 2023-07-07 | Discharge: 2023-07-07 | Disposition: A | Discharge status: Home / Self Care | Payer: Medicare Other | Attending: Cardiology | Admitting: Cardiology

## 2023-07-07 ENCOUNTER — Encounter (HOSPITAL_COMMUNITY)
Admission: RE | Disposition: A | Discharge status: Home / Self Care | Payer: Self-pay | Source: Home / Self Care | Attending: Cardiology

## 2023-07-07 ENCOUNTER — Other Ambulatory Visit: Payer: Self-pay

## 2023-07-07 ENCOUNTER — Encounter (HOSPITAL_COMMUNITY): Payer: Self-pay | Admitting: Cardiology

## 2023-07-07 DIAGNOSIS — I2584 Coronary atherosclerosis due to calcified coronary lesion: Secondary | ICD-10-CM | POA: Diagnosis not present

## 2023-07-07 DIAGNOSIS — I10 Essential (primary) hypertension: Secondary | ICD-10-CM | POA: Diagnosis not present

## 2023-07-07 DIAGNOSIS — Z7982 Long term (current) use of aspirin: Secondary | ICD-10-CM | POA: Diagnosis not present

## 2023-07-07 DIAGNOSIS — I440 Atrioventricular block, first degree: Secondary | ICD-10-CM | POA: Insufficient documentation

## 2023-07-07 DIAGNOSIS — Z79899 Other long term (current) drug therapy: Secondary | ICD-10-CM | POA: Diagnosis not present

## 2023-07-07 DIAGNOSIS — E785 Hyperlipidemia, unspecified: Secondary | ICD-10-CM | POA: Diagnosis not present

## 2023-07-07 DIAGNOSIS — I25119 Atherosclerotic heart disease of native coronary artery with unspecified angina pectoris: Secondary | ICD-10-CM | POA: Diagnosis not present

## 2023-07-07 DIAGNOSIS — I350 Nonrheumatic aortic (valve) stenosis: Secondary | ICD-10-CM | POA: Insufficient documentation

## 2023-07-07 DIAGNOSIS — I2510 Atherosclerotic heart disease of native coronary artery without angina pectoris: Secondary | ICD-10-CM

## 2023-07-07 DIAGNOSIS — Z7902 Long term (current) use of antithrombotics/antiplatelets: Secondary | ICD-10-CM | POA: Diagnosis not present

## 2023-07-07 DIAGNOSIS — I209 Angina pectoris, unspecified: Secondary | ICD-10-CM

## 2023-07-07 DIAGNOSIS — G4733 Obstructive sleep apnea (adult) (pediatric): Secondary | ICD-10-CM | POA: Insufficient documentation

## 2023-07-07 HISTORY — PX: CORONARY PRESSURE/FFR STUDY: CATH118243

## 2023-07-07 HISTORY — PX: RIGHT/LEFT HEART CATH AND CORONARY ANGIOGRAPHY: CATH118266

## 2023-07-07 LAB — POCT I-STAT EG7
Acid-Base Excess: 1 mmol/L (ref 0.0–2.0)
Acid-Base Excess: 1 mmol/L (ref 0.0–2.0)
Acid-Base Excess: 2 mmol/L (ref 0.0–2.0)
Bicarbonate: 27.2 mmol/L (ref 20.0–28.0)
Bicarbonate: 27.5 mmol/L (ref 20.0–28.0)
Bicarbonate: 28.1 mmol/L — ABNORMAL HIGH (ref 20.0–28.0)
Calcium, Ion: 1.18 mmol/L (ref 1.15–1.40)
Calcium, Ion: 1.23 mmol/L (ref 1.15–1.40)
Calcium, Ion: 1.23 mmol/L (ref 1.15–1.40)
HCT: 40 % (ref 39.0–52.0)
HCT: 40 % (ref 39.0–52.0)
HCT: 41 % (ref 39.0–52.0)
Hemoglobin: 13.6 g/dL (ref 13.0–17.0)
Hemoglobin: 13.6 g/dL (ref 13.0–17.0)
Hemoglobin: 13.9 g/dL (ref 13.0–17.0)
O2 Saturation: 64 %
O2 Saturation: 64 %
O2 Saturation: 68 %
Potassium: 3.9 mmol/L (ref 3.5–5.1)
Potassium: 4 mmol/L (ref 3.5–5.1)
Potassium: 4 mmol/L (ref 3.5–5.1)
Sodium: 140 mmol/L (ref 135–145)
Sodium: 140 mmol/L (ref 135–145)
Sodium: 140 mmol/L (ref 135–145)
TCO2: 29 mmol/L (ref 22–32)
TCO2: 29 mmol/L (ref 22–32)
TCO2: 30 mmol/L (ref 22–32)
pCO2, Ven: 47.3 mm[Hg] (ref 44–60)
pCO2, Ven: 47.9 mm[Hg] (ref 44–60)
pCO2, Ven: 49.1 mm[Hg] (ref 44–60)
pH, Ven: 7.367 (ref 7.25–7.43)
pH, Ven: 7.367 (ref 7.25–7.43)
pH, Ven: 7.368 (ref 7.25–7.43)
pO2, Ven: 35 mm[Hg] (ref 32–45)
pO2, Ven: 35 mm[Hg] (ref 32–45)
pO2, Ven: 37 mm[Hg] (ref 32–45)

## 2023-07-07 LAB — POCT ACTIVATED CLOTTING TIME: Activated Clotting Time: 268 s

## 2023-07-07 LAB — POCT I-STAT 7, (LYTES, BLD GAS, ICA,H+H)
Acid-Base Excess: 0 mmol/L (ref 0.0–2.0)
Bicarbonate: 26.2 mmol/L (ref 20.0–28.0)
Calcium, Ion: 1.2 mmol/L (ref 1.15–1.40)
HCT: 40 % (ref 39.0–52.0)
Hemoglobin: 13.6 g/dL (ref 13.0–17.0)
O2 Saturation: 93 %
Potassium: 4 mmol/L (ref 3.5–5.1)
Sodium: 139 mmol/L (ref 135–145)
TCO2: 28 mmol/L (ref 22–32)
pCO2 arterial: 45.3 mm[Hg] (ref 32–48)
pH, Arterial: 7.369 (ref 7.35–7.45)
pO2, Arterial: 68 mm[Hg] — ABNORMAL LOW (ref 83–108)

## 2023-07-07 LAB — GLUCOSE, CAPILLARY: Glucose-Capillary: 102 mg/dL — ABNORMAL HIGH (ref 70–99)

## 2023-07-07 SURGERY — RIGHT/LEFT HEART CATH AND CORONARY ANGIOGRAPHY
Anesthesia: LOCAL

## 2023-07-07 MED ORDER — AMLODIPINE BESYLATE 5 MG PO TABS: 5.0000 mg | ORAL_TABLET | Freq: Every day | ORAL | 2 refills | Status: DC

## 2023-07-07 MED ORDER — SODIUM CHLORIDE 0.9 % WEIGHT BASED INFUSION: 3.0000 mL/kg/h | INTRAVENOUS | Status: AC

## 2023-07-07 MED ORDER — LIDOCAINE HCL (PF) 1 % IJ SOLN
INTRAMUSCULAR | Status: DC | PRN
  Administered 2023-07-07 (×2): 2 mL

## 2023-07-07 MED ORDER — NITROGLYCERIN 1 MG/10 ML FOR IR/CATH LAB
INTRA_ARTERIAL | Status: AC
  Filled 2023-07-07: qty 10

## 2023-07-07 MED ORDER — HEPARIN SODIUM (PORCINE) 1000 UNIT/ML IJ SOLN
INTRAMUSCULAR | Status: DC | PRN
  Administered 2023-07-07: 5000 [IU] via INTRAVENOUS
  Administered 2023-07-07: 4000 [IU] via INTRAVENOUS

## 2023-07-07 MED ORDER — VERAPAMIL HCL 2.5 MG/ML IV SOLN
INTRAVENOUS | Status: DC | PRN
  Administered 2023-07-07: 10 mL via INTRA_ARTERIAL

## 2023-07-07 MED ORDER — SODIUM CHLORIDE 0.9% FLUSH: 3.0000 mL | Freq: Two times a day (BID) | INTRAVENOUS | Status: DC

## 2023-07-07 MED ORDER — IOHEXOL 350 MG/ML SOLN
INTRAVENOUS | Status: DC | PRN
  Administered 2023-07-07: 90 mL

## 2023-07-07 MED ORDER — HEPARIN (PORCINE) IN NACL 1000-0.9 UT/500ML-% IV SOLN
INTRAVENOUS | Status: DC | PRN
  Administered 2023-07-07 (×2): 500 mL

## 2023-07-07 MED ORDER — FENTANYL CITRATE (PF) 100 MCG/2ML IJ SOLN
INTRAMUSCULAR | Status: DC | PRN
  Administered 2023-07-07: 50 ug via INTRAVENOUS

## 2023-07-07 MED ORDER — ACETAMINOPHEN 325 MG PO TABS: 650.0000 mg | ORAL_TABLET | ORAL | Status: DC | PRN

## 2023-07-07 MED ORDER — MIDAZOLAM HCL 2 MG/2ML IJ SOLN
INTRAMUSCULAR | Status: AC
  Filled 2023-07-07: qty 2

## 2023-07-07 MED ORDER — MIDAZOLAM HCL 2 MG/2ML IJ SOLN
INTRAMUSCULAR | Status: DC | PRN
  Administered 2023-07-07: 2 mg via INTRAVENOUS

## 2023-07-07 MED ORDER — LIDOCAINE HCL (PF) 1 % IJ SOLN
INTRAMUSCULAR | Status: AC
  Filled 2023-07-07: qty 30

## 2023-07-07 MED ORDER — SODIUM CHLORIDE 0.9 % WEIGHT BASED INFUSION: 1.0000 mL/kg/h | INTRAVENOUS | Status: DC

## 2023-07-07 MED ORDER — ADENOSINE 12 MG/4ML IV SOLN
INTRAVENOUS | Status: AC
  Filled 2023-07-07: qty 4

## 2023-07-07 MED ORDER — ASPIRIN 81 MG PO CHEW: 81.0000 mg | CHEWABLE_TABLET | ORAL | Status: DC

## 2023-07-07 MED ORDER — ADENOSINE (DIAGNOSTIC) 140MCG/KG/MIN
INTRAVENOUS | Status: DC | PRN
  Administered 2023-07-07: 140 ug/kg/min via INTRAVENOUS

## 2023-07-07 MED ORDER — FENTANYL CITRATE (PF) 100 MCG/2ML IJ SOLN
INTRAMUSCULAR | Status: AC
  Filled 2023-07-07: qty 2

## 2023-07-07 MED ORDER — ADENOSINE 12 MG/4ML IV SOLN
INTRAVENOUS | Status: AC
  Filled 2023-07-07: qty 12

## 2023-07-07 MED ORDER — HEPARIN SODIUM (PORCINE) 1000 UNIT/ML IJ SOLN
INTRAMUSCULAR | Status: AC
  Filled 2023-07-07: qty 10

## 2023-07-07 MED ORDER — VERAPAMIL HCL 2.5 MG/ML IV SOLN
INTRAVENOUS | Status: AC
  Filled 2023-07-07: qty 2

## 2023-07-07 MED ORDER — ONDANSETRON HCL 4 MG/2ML IJ SOLN: 4.0000 mg | Freq: Four times a day (QID) | INTRAMUSCULAR | Status: DC | PRN

## 2023-07-07 SURGICAL SUPPLY — 14 items
CATH BALLN WEDGE 5F 110CM (CATHETERS) IMPLANT
CATH INFINITI AMBI 5FR TG (CATHETERS) IMPLANT
CATH LANGSTON DUAL LUM PIG 6FR (CATHETERS) IMPLANT
CATH LAUNCHER 6FR EBU3.5 (CATHETERS) IMPLANT
DEVICE RAD COMP TR BAND LRG (VASCULAR PRODUCTS) IMPLANT
GLIDESHEATH SLEND A-KIT 6F 22G (SHEATH) IMPLANT
GUIDEWIRE .025 260CM (WIRE) IMPLANT
GUIDEWIRE INQWIRE 1.5J.035X260 (WIRE) IMPLANT
GUIDEWIRE PRESSURE X 175 (WIRE) IMPLANT
INQWIRE 1.5J .035X260CM (WIRE) ×1
KIT ESSENTIALS PG (KITS) IMPLANT
PACK CARDIAC CATHETERIZATION (CUSTOM PROCEDURE TRAY) ×1 IMPLANT
SET ATX-X65L (MISCELLANEOUS) IMPLANT
SHEATH GLIDE SLENDER 4/5FR (SHEATH) IMPLANT

## 2023-07-07 NOTE — Progress Notes (Signed)
TR BAND REMOVAL  LOCATION:    right radial  DEFLATED PER PROTOCOL:    Yes.    TIME BAND OFF / DRESSING APPLIED:    1555 gauze dressing applied   SITE UPON ARRIVAL:    Level 0  SITE AFTER BAND REMOVAL:    Level 0  CIRCULATION SENSATION AND MOVEMENT:    Within Normal Limits   Yes.    COMMENTS:   no issues noted

## 2023-07-07 NOTE — Discharge Instructions (Signed)

## 2023-07-07 NOTE — Interval H&P Note (Signed)
History and Physical Interval Note:  07/07/2023 12:53 PM  Scott Young  has presented today for surgery, with the diagnosis of angina.  The various methods of treatment have been discussed with the patient and family. After consideration of risks, benefits and other options for treatment, the patient has consented to  Procedure(s): RIGHT/LEFT HEART CATH AND CORONARY ANGIOGRAPHY (N/A) and possible coronary angioplasty as a surgical intervention.  The patient's history has been reviewed, patient examined, no change in status, stable for surgery.  I have reviewed the patient's chart and labs.  Questions were answered to the patient's satisfaction.    Cath Lab Visit (complete for each Cath Lab visit)  Clinical Evaluation Leading to the Procedure:   ACS: No.  Non-ACS:    Anginal Classification: CCS III  Anti-ischemic medical therapy: Maximal Therapy (2 or more classes of medications)  Non-Invasive Test Results: No non-invasive testing performed  Prior CABG: No previous CABG  Yates Decamp

## 2023-07-11 MED FILL — Nitroglycerin IV Soln 100 MCG/ML in D5W: INTRA_ARTERIAL | Qty: 10 | Status: AC

## 2023-07-18 ENCOUNTER — Other Ambulatory Visit: Payer: Self-pay

## 2023-07-18 MED ORDER — AMLODIPINE BESYLATE 5 MG PO TABS: 5.0000 mg | ORAL_TABLET | Freq: Every day | ORAL | 3 refills | Status: DC

## 2023-07-25 ENCOUNTER — Ambulatory Visit (HOSPITAL_COMMUNITY): Payer: Medicare Other | Attending: Cardiovascular Disease

## 2023-07-25 DIAGNOSIS — I350 Nonrheumatic aortic (valve) stenosis: Secondary | ICD-10-CM | POA: Diagnosis not present

## 2023-07-25 LAB — ECHOCARDIOGRAM COMPLETE
AR max vel: 1.67 cm2
AV Area VTI: 1.79 cm2
AV Area mean vel: 1.67 cm2
AV Mean grad: 15.8 mm[Hg]
AV Peak grad: 28.3 mm[Hg]
Ao pk vel: 2.66 m/s
Area-P 1/2: 3.25 cm2
P 1/2 time: 577 ms
S' Lateral: 2.4 cm

## 2023-07-26 ENCOUNTER — Encounter: Payer: Self-pay | Admitting: Physician Assistant

## 2023-07-26 ENCOUNTER — Telehealth: Payer: Self-pay

## 2023-07-26 DIAGNOSIS — I350 Nonrheumatic aortic (valve) stenosis: Secondary | ICD-10-CM

## 2023-07-26 NOTE — Telephone Encounter (Signed)
-----   Message from Laurann Montana sent at 07/25/2023  1:12 PM EST ----- Please let pt know echo largely stable with normal pump function, mild stiffness of heart similar to prior study. Aortic valve narrowing is in the mild-moderate range. Recommend repeat echo 1 year, can put in order and recall for this.

## 2023-07-26 NOTE — Progress Notes (Signed)
Per d/w RN patient offered post cath f/u vs 6 mo f/u, he elected latter, recall in.

## 2023-07-26 NOTE — Telephone Encounter (Signed)
Spoke with patient concerning recent echo - order placed for repeat echo in 1 year

## 2023-08-04 ENCOUNTER — Ambulatory Visit: Payer: Medicare Other | Admitting: Psychiatry

## 2023-08-04 ENCOUNTER — Ambulatory Visit: Payer: Medicare Other | Admitting: Behavioral Health

## 2023-08-04 ENCOUNTER — Encounter: Payer: Self-pay | Admitting: Behavioral Health

## 2023-08-04 DIAGNOSIS — F32A Depression, unspecified: Secondary | ICD-10-CM

## 2023-08-04 MED ORDER — FLUOXETINE HCL 20 MG PO CAPS: 60.0000 mg | ORAL_CAPSULE | Freq: Every day | ORAL | 1 refills | Status: DC

## 2023-08-04 NOTE — Progress Notes (Signed)
[facility]  Patient ID: Scott Young,  MRN: [id number]  PCP: Renford Dills, MD  Date of Evaluation: 08/04/2023 Time spent:30 minutes  Chief Complaint:  Chief Complaint   Depression; Follow-up; Medication Refill; Patient Education     HISTORY/CURRENT STATUS: HPI  Scott Young, 73 year old male presents to the office today for follow-up of depression. He is a previous patient of Corie Chiquito. Says that he continues to enjoy a long period of stability.  Requesting no medication changes today and would like to continue with one year f/u's. He is eating and sleeping well. "Says that he has been blessed. Retired from  [employer].  He reports adequate concentration. He reports that his focus and clarity is not as good first thing in the morning. Denies SI.    He continues to use his cPap machine.   Living in townhouse now. Daughter has a neuromuscular disease and neurocognitive disorder and has been living in their townhouse. They plan on trying to help care for her.    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       Individual Medical History/ Review of Systems: Changes? :No   Allergies: Codeine and Metformin and related  Current Medications:  Current Outpatient Medications:    amLODipine (NORVASC) 5 MG tablet, Take 1 tablet (5 mg total) by mouth daily., Disp: 90 tablet, Rfl: 3   aspirin 81 MG tablet, Take 81 mg by mouth at bedtime. , Disp: , Rfl:    atorvastatin (LIPITOR) 20 MG tablet, Take 20 mg by mouth at bedtime., Disp: , Rfl:    carboxymethylcellulose (REFRESH TEARS) 0.5 % SOLN, Place 1 drop into both eyes daily., Disp: , Rfl:    clopidogrel (PLAVIX) 75 MG tablet, TAKE 1 TABLET BY MOUTH DAILY  WITH BREAKFAST, Disp: 100 tablet, Rfl: 1   ezetimibe (ZETIA) 10 MG tablet, Take 1 tablet (10 mg total) by mouth daily. (Patient taking differently: Take 10 mg by mouth every evening.), Disp: 90 tablet, Rfl: 3   fexofenadine (ALLEGRA) 180 MG tablet,  Take 180 mg by mouth daily., Disp: , Rfl:    FLUoxetine (PROZAC) 20 MG capsule, Take 3 capsules (60 mg total) by mouth daily., Disp: 270 capsule, Rfl: 1   fluticasone (FLONASE) 50 MCG/ACT nasal spray, Place 1 spray into both nostrils daily., Disp: , Rfl:    isosorbide mononitrate (IMDUR) 60 MG 24 hr tablet, TAKE 1 TABLET BY MOUTH DAILY, Disp: 100 tablet, Rfl: 1   losartan (COZAAR) 25 MG tablet, Take 25 mg by mouth daily., Disp: , Rfl:    Misc Natural Products (PROSTATE SUPPORT PO), Take 3 capsules by mouth daily., Disp: , Rfl:    nitroGLYCERIN (NITROSTAT) 0.4 MG SL tablet, Place 1 tablet (0.4 mg total) under the tongue every 5 (five) minutes as needed for chest pain., Disp: 25 tablet, Rfl: 11   ONETOUCH VERIO test strip, 1 each daily., Disp: , Rfl:    TRULICITY 0.75 MG/0.5ML SOPN, Inject 0.75 mg as directed once a week. Sunday, Disp: , Rfl:  Medication Side Effects: none  Family Medical/ Social History: Changes? No  MENTAL HEALTH EXAM:  There were no vitals taken for this visit.There is no height or weight on file to calculate BMI.  General Appearance: Casual, Neat, and Well Groomed  Eye Contact:  Good  Speech:  Clear and Coherent  Volume:  Normal  Mood:  NA  Affect:  Appropriate  Thought Process:  Coherent  Orientation:  Full (Time, Place,  and Person)  Thought Content: Logical   Suicidal Thoughts:  No  Homicidal Thoughts:  No  Memory:  WNL  Judgement:  Good  Insight:  Good  Psychomotor Activity:  Normal  Concentration:  Concentration: Good  Recall:  Good  Fund of Knowledge: Good  Language: Good  Assets:  Desire for Improvement  ADL's:  Intact  Cognition: WNL  Prognosis:  Good    DIAGNOSES:    ICD-10-CM   1. Depression, unspecified depression type  F32.A FLUoxetine (PROZAC) 20 MG capsule      Receiving Psychotherapy: No    RECOMMENDATIONS:   Pt seen for 30 minutes and time spent discussing Prozac dosage. Enjoying a calm period of stability. No significant  depression since last visit. Coping very well. He reports that depression then improved.   Encouraged pt to contact office if he experiences worsening depression.  Will continue Prozac 60 mg po qd for depression.  Pt to follow-up in one year or sooner if clinically indicated. Patient advised to contact office with any questions, adverse  effects, or acute worsening in signs and symptoms. Provided emergency contact information Reviewed PDMP    Joan Flores, NP

## 2023-08-30 DIAGNOSIS — G4733 Obstructive sleep apnea (adult) (pediatric): Secondary | ICD-10-CM | POA: Diagnosis not present

## 2023-09-08 DIAGNOSIS — I10 Essential (primary) hypertension: Secondary | ICD-10-CM | POA: Diagnosis not present

## 2023-09-08 DIAGNOSIS — E7800 Pure hypercholesterolemia, unspecified: Secondary | ICD-10-CM | POA: Diagnosis not present

## 2023-09-08 DIAGNOSIS — E1169 Type 2 diabetes mellitus with other specified complication: Secondary | ICD-10-CM | POA: Diagnosis not present

## 2023-09-08 DIAGNOSIS — G4733 Obstructive sleep apnea (adult) (pediatric): Secondary | ICD-10-CM | POA: Diagnosis not present

## 2023-09-08 DIAGNOSIS — I25118 Atherosclerotic heart disease of native coronary artery with other forms of angina pectoris: Secondary | ICD-10-CM | POA: Diagnosis not present

## 2023-09-08 DIAGNOSIS — I700 Atherosclerosis of aorta: Secondary | ICD-10-CM | POA: Diagnosis not present

## 2023-09-08 DIAGNOSIS — Z0000 Encounter for general adult medical examination without abnormal findings: Secondary | ICD-10-CM | POA: Diagnosis not present

## 2023-12-12 DIAGNOSIS — I350 Nonrheumatic aortic (valve) stenosis: Secondary | ICD-10-CM | POA: Diagnosis not present

## 2023-12-12 DIAGNOSIS — I25118 Atherosclerotic heart disease of native coronary artery with other forms of angina pectoris: Secondary | ICD-10-CM | POA: Diagnosis not present

## 2023-12-26 DIAGNOSIS — H17821 Peripheral opacity of cornea, right eye: Secondary | ICD-10-CM | POA: Diagnosis not present

## 2023-12-26 DIAGNOSIS — H2511 Age-related nuclear cataract, right eye: Secondary | ICD-10-CM | POA: Diagnosis not present

## 2023-12-26 DIAGNOSIS — E119 Type 2 diabetes mellitus without complications: Secondary | ICD-10-CM | POA: Diagnosis not present

## 2023-12-26 DIAGNOSIS — H43393 Other vitreous opacities, bilateral: Secondary | ICD-10-CM | POA: Diagnosis not present

## 2023-12-26 DIAGNOSIS — H04123 Dry eye syndrome of bilateral lacrimal glands: Secondary | ICD-10-CM | POA: Diagnosis not present

## 2023-12-26 DIAGNOSIS — H35372 Puckering of macula, left eye: Secondary | ICD-10-CM | POA: Diagnosis not present

## 2023-12-26 DIAGNOSIS — Z961 Presence of intraocular lens: Secondary | ICD-10-CM | POA: Diagnosis not present

## 2023-12-28 DIAGNOSIS — G4733 Obstructive sleep apnea (adult) (pediatric): Secondary | ICD-10-CM | POA: Diagnosis not present

## 2024-01-06 ENCOUNTER — Other Ambulatory Visit: Payer: Self-pay | Admitting: Nurse Practitioner

## 2024-01-12 DIAGNOSIS — I350 Nonrheumatic aortic (valve) stenosis: Secondary | ICD-10-CM | POA: Diagnosis not present

## 2024-01-12 DIAGNOSIS — I25118 Atherosclerotic heart disease of native coronary artery with other forms of angina pectoris: Secondary | ICD-10-CM | POA: Diagnosis not present

## 2024-01-14 ENCOUNTER — Other Ambulatory Visit: Payer: Self-pay | Admitting: Interventional Cardiology

## 2024-01-24 ENCOUNTER — Other Ambulatory Visit: Payer: Self-pay | Admitting: Behavioral Health

## 2024-01-24 DIAGNOSIS — F32A Depression, unspecified: Secondary | ICD-10-CM

## 2024-02-12 DIAGNOSIS — I25118 Atherosclerotic heart disease of native coronary artery with other forms of angina pectoris: Secondary | ICD-10-CM | POA: Diagnosis not present

## 2024-02-12 DIAGNOSIS — I350 Nonrheumatic aortic (valve) stenosis: Secondary | ICD-10-CM | POA: Diagnosis not present

## 2024-02-29 ENCOUNTER — Other Ambulatory Visit: Payer: Self-pay | Admitting: Physician Assistant

## 2024-03-12 DIAGNOSIS — E7800 Pure hypercholesterolemia, unspecified: Secondary | ICD-10-CM | POA: Diagnosis not present

## 2024-03-12 DIAGNOSIS — I10 Essential (primary) hypertension: Secondary | ICD-10-CM | POA: Diagnosis not present

## 2024-03-12 DIAGNOSIS — G4733 Obstructive sleep apnea (adult) (pediatric): Secondary | ICD-10-CM | POA: Diagnosis not present

## 2024-03-12 DIAGNOSIS — E1169 Type 2 diabetes mellitus with other specified complication: Secondary | ICD-10-CM | POA: Diagnosis not present

## 2024-03-12 DIAGNOSIS — I700 Atherosclerosis of aorta: Secondary | ICD-10-CM | POA: Diagnosis not present

## 2024-03-12 DIAGNOSIS — I25118 Atherosclerotic heart disease of native coronary artery with other forms of angina pectoris: Secondary | ICD-10-CM | POA: Diagnosis not present

## 2024-03-13 DIAGNOSIS — I350 Nonrheumatic aortic (valve) stenosis: Secondary | ICD-10-CM | POA: Diagnosis not present

## 2024-03-13 DIAGNOSIS — I25118 Atherosclerotic heart disease of native coronary artery with other forms of angina pectoris: Secondary | ICD-10-CM | POA: Diagnosis not present

## 2024-03-22 DIAGNOSIS — G4733 Obstructive sleep apnea (adult) (pediatric): Secondary | ICD-10-CM | POA: Diagnosis not present

## 2024-04-09 ENCOUNTER — Other Ambulatory Visit: Payer: Self-pay | Admitting: Nurse Practitioner

## 2024-04-16 ENCOUNTER — Observation Stay (HOSPITAL_BASED_OUTPATIENT_CLINIC_OR_DEPARTMENT_OTHER)
Admission: EM | Admit: 2024-04-16 | Discharge: 2024-04-19 | Disposition: A | Discharge status: Home / Self Care | Source: Ambulatory Visit | Attending: Internal Medicine | Admitting: Internal Medicine

## 2024-04-16 ENCOUNTER — Emergency Department (HOSPITAL_BASED_OUTPATIENT_CLINIC_OR_DEPARTMENT_OTHER): Admitting: Radiology

## 2024-04-16 ENCOUNTER — Other Ambulatory Visit: Payer: Self-pay

## 2024-04-16 ENCOUNTER — Encounter (HOSPITAL_BASED_OUTPATIENT_CLINIC_OR_DEPARTMENT_OTHER): Payer: Self-pay

## 2024-04-16 ENCOUNTER — Encounter: Payer: Self-pay | Admitting: Interventional Cardiology

## 2024-04-16 DIAGNOSIS — R079 Chest pain, unspecified: Secondary | ICD-10-CM | POA: Diagnosis present

## 2024-04-16 DIAGNOSIS — Z87891 Personal history of nicotine dependence: Secondary | ICD-10-CM | POA: Insufficient documentation

## 2024-04-16 DIAGNOSIS — R0789 Other chest pain: Secondary | ICD-10-CM | POA: Diagnosis not present

## 2024-04-16 DIAGNOSIS — Z7982 Long term (current) use of aspirin: Secondary | ICD-10-CM | POA: Diagnosis not present

## 2024-04-16 DIAGNOSIS — I350 Nonrheumatic aortic (valve) stenosis: Secondary | ICD-10-CM | POA: Insufficient documentation

## 2024-04-16 DIAGNOSIS — G4733 Obstructive sleep apnea (adult) (pediatric): Secondary | ICD-10-CM | POA: Diagnosis present

## 2024-04-16 DIAGNOSIS — N179 Acute kidney failure, unspecified: Secondary | ICD-10-CM | POA: Diagnosis not present

## 2024-04-16 DIAGNOSIS — E119 Type 2 diabetes mellitus without complications: Secondary | ICD-10-CM

## 2024-04-16 DIAGNOSIS — Z794 Long term (current) use of insulin: Secondary | ICD-10-CM | POA: Diagnosis not present

## 2024-04-16 DIAGNOSIS — N1831 Chronic kidney disease, stage 3a: Secondary | ICD-10-CM | POA: Insufficient documentation

## 2024-04-16 DIAGNOSIS — I129 Hypertensive chronic kidney disease with stage 1 through stage 4 chronic kidney disease, or unspecified chronic kidney disease: Secondary | ICD-10-CM | POA: Diagnosis not present

## 2024-04-16 DIAGNOSIS — E1122 Type 2 diabetes mellitus with diabetic chronic kidney disease: Secondary | ICD-10-CM | POA: Insufficient documentation

## 2024-04-16 DIAGNOSIS — Z7901 Long term (current) use of anticoagulants: Secondary | ICD-10-CM | POA: Diagnosis not present

## 2024-04-16 DIAGNOSIS — I10 Essential (primary) hypertension: Secondary | ICD-10-CM | POA: Diagnosis present

## 2024-04-16 DIAGNOSIS — F32A Depression, unspecified: Secondary | ICD-10-CM | POA: Diagnosis present

## 2024-04-16 LAB — CBC
HCT: 42.2 % (ref 39.0–52.0)
Hemoglobin: 14.1 g/dL (ref 13.0–17.0)
MCH: 30.8 pg (ref 26.0–34.0)
MCHC: 33.4 g/dL (ref 30.0–36.0)
MCV: 92.1 fL (ref 80.0–100.0)
Platelets: 191 K/uL (ref 150–400)
RBC: 4.58 MIL/uL (ref 4.22–5.81)
RDW: 12.8 % (ref 11.5–15.5)
WBC: 7 K/uL (ref 4.0–10.5)
nRBC: 0 % (ref 0.0–0.2)

## 2024-04-16 LAB — BASIC METABOLIC PANEL WITH GFR
Anion gap: 11 (ref 5–15)
BUN: 17 mg/dL (ref 8–23)
CO2: 24 mmol/L (ref 22–32)
Calcium: 9.7 mg/dL (ref 8.9–10.3)
Chloride: 103 mmol/L (ref 98–111)
Creatinine, Ser: 1.33 mg/dL — ABNORMAL HIGH (ref 0.61–1.24)
GFR, Estimated: 56 mL/min — ABNORMAL LOW (ref >60)
Glucose, Bld: 125 mg/dL — ABNORMAL HIGH (ref 70–99)
Potassium: 4.3 mmol/L (ref 3.5–5.1)
Sodium: 138 mmol/L (ref 135–145)

## 2024-04-16 LAB — TROPONIN T, HIGH SENSITIVITY: Troponin T High Sensitivity: <15 ng/L (ref 0–19)

## 2024-04-16 NOTE — ED Provider Notes (Signed)
 [facility] Emergency Department Provider Note MRN:  [id number]  Arrival date & time: 04/17/24     Chief Complaint   Chest Pain, Weakness, and Nausea   History of Present Illness   Scott Young is a 73 y.o. year-old male with a history of CAD presenting to the ED with chief complaint of chest pain.  For the past 4 days patient has had increased frequency of chest pain and other symptoms.  Explains that back in January he was having some chest discomfort and saw his cardiologist and they increased his dose of long-acting nitroglycerin  and he has been doing well since then.  Now he is having chest pressure associated with nausea and weakness, mild at rest but gets much worse with any exertion.  Feeling very weak and unwell even with mild exertional activities such as trying to help clean the house.  Review of Systems  A thorough review of systems was obtained and all systems are negative except as noted in the HPI and PMH.   Patient's Health History    Past Medical History:  Diagnosis Date   Depression    H/O seasonal allergies    tx. OTC meds   Hyperlipidemia    Hypertension    Prostatitis    Sleep apnea    cpap   Umbilical hernia    resolved -s/p surgical repair    Past Surgical History:  Procedure Laterality Date   APPENDECTOMY     CATARACT EXTRACTION Left    30 yrs ago   COLONOSCOPY      multiple- x1 colon polyp removed   COLONOSCOPY WITH PROPOFOL  N/A 10/22/2014   Procedure: COLONOSCOPY WITH PROPOFOL ;  Surgeon: Gladis MARLA Louder, MD;  Location: [facility];  Service: Endoscopy;  Laterality: N/A;   CORONARY PRESSURE/FFR STUDY N/A 03/23/2022   Procedure: INTRAVASCULAR PRESSURE WIRE/FFR STUDY;  Surgeon: Claudene Victory ORN, MD;  Location: [facility] INVASIVE CV LAB;  Service: Cardiovascular;  Laterality: N/A;   CORONARY PRESSURE/FFR STUDY N/A 07/07/2023   Procedure: CORONARY PRESSURE/FFR STUDY;  Surgeon: Ladona Heinz, MD;  Location: [facility] INVASIVE CV LAB;  Service:  Cardiovascular;  Laterality: N/A;   EYE SURGERY     choriziom removed right eye 12'15   HERNIA REPAIR     umbilical hernia repair   LEFT HEART CATH AND CORONARY ANGIOGRAPHY N/A 07/04/2018   Procedure: LEFT HEART CATH AND CORONARY ANGIOGRAPHY;  Surgeon: Claudene Victory ORN, MD;  Location: [facility] INVASIVE CV LAB;  Service: Cardiovascular;  Laterality: N/A;   LEFT HEART CATH AND CORONARY ANGIOGRAPHY N/A 03/23/2022   Procedure: LEFT HEART CATH AND CORONARY ANGIOGRAPHY;  Surgeon: Claudene Victory ORN, MD;  Location: [facility]VAS[facility] CV LAB;  Service: Cardiovascular;  Laterality: N/A;   RIGHT/LEFT HEART CATH AND CORONARY ANGIOGRAPHY N/A 07/07/2023   Procedure: RIGHT/LEFT HEART CATH AND CORONARY ANGIOGRAPHY;  Surgeon: Ladona Heinz, MD;  Location: [facility]VASIVE CV LAB;  Service: Cardiovascular;  Laterality: N/A;   TONSILLECTOMY      Family History  Problem Relation Age of Onset   Heart disease Mother    Cancer Father        colon   Aneurysm Father    Aneurysm Brother     Social History   Socioeconomic History   Marital status: Married    Spouse name: Not on file   Number of children: Not on file   Years of education: Not on file   Highest education level: Not on file  Occupational History   Not on file  Tobacco Use   Smoking status: Former   Smokeless tobacco: Former    Quit date: 09/23/1970  Vaping Use   Vaping status: Never Used  Substance and Sexual Activity   Alcohol use: No   Drug use: No   Sexual activity: Not Currently  Other Topics Concern   Not on file  Social History Narrative   Not on file   Social Drivers of Health   Financial Resource Strain: Not on file  Food Insecurity: Not on file  Transportation Needs: Not on file  Physical Activity: Not on file  Stress: Not on file  Social Connections: Unknown (10/27/2021)   Received from [facility]   Social Network    Social Network: Not on file  Intimate Partner Violence: Unknown (09/18/2021)   Received from [facility]   HITS     Physically Hurt: Not on file    Insult or Talk Down To: Not on file    Threaten Physical Harm: Not on file    Scream or Curse: Not on file     Physical Exam   Vitals:   04/16/24 2302 04/16/24 2330  BP: (!) 147/80   Pulse: 68   Resp: 18   Temp:  97.6 F (36.4 C)  SpO2: 99%     CONSTITUTIONAL: Well-appearing, NAD NEURO/PSYCH:  Alert and oriented x 3, no focal deficits EYES:  eyes equal and reactive ENT/NECK:  no LAD, no JVD CARDIO: Regular rate, well-perfused, systolic murmur noted PULM:  CTAB no wheezing or rhonchi GI/GU:  non-distended, non-tender MSK/SPINE:  No gross deformities, no edema SKIN:  no rash, atraumatic   *Additional and/or pertinent findings included in MDM below  Diagnostic and Interventional Summary    EKG Interpretation Date/Time:  Monday April 16 2024 17:48:52 EST Ventricular Rate:  70 PR Interval:  290 QRS Duration:  100 QT Interval:  396 QTC Calculation: 427 R Axis:   36  Text Interpretation: Sinus rhythm with 1st degree A-V block T wave abnormality, consider inferior ischemia Abnormal ECG When compared with ECG of 07-Jul-2023 15:00, Criteria for Inferior infarct are no longer Present Confirmed by Theadore Sharper [phone]) on 04/16/2024 10:50:30 PM       Labs Reviewed  BASIC METABOLIC PANEL WITH GFR - Abnormal; Notable for the following components:      Result Value   Glucose, Bld 125 (*)    Creatinine, Ser 1.33 (*)    GFR, Estimated 56 (*)    All other components within normal limits  CBC  MAGNESIUM  TROPONIN T, HIGH SENSITIVITY  TROPONIN T, HIGH SENSITIVITY    DG Chest 2 View  Final Result      Medications - No data to display   Procedures  /  Critical Care Procedures  ED Course and Medical Decision Making  Initial Impression and Ddx Concern for ACS, with pain and symptoms happening at rest and worsening with exertion, concern for possible unstable angina.  He has known CAD, had a catheterization at the beginning of the year.   He also has aortic stenosis which may be contributing.  Murmur on exam is fairly loud, possibly worse than last documented.  Reassuring vital signs at this time, nontoxic, minimal symptoms while at rest currently.  Reaching out to cardiology to discuss any benefits to admission for repeat ischemic evaluation versus further optimization of medical management and close follow-up.  Past medical/surgical history that increases complexity of ED encounter: CAD  Interpretation of Diagnostics I personally reviewed the EKG and my interpretation  is as follows: Sinus rhythm without ischemic changes  No significant blood count or electrolyte disturbance.  First troponin negative  Patient Reassessment and Ultimate Disposition/Management     Case discussed with Dr. Gail of cardiology, plan is for hospitalist admission.  Considerations while inpatient will include repeat echo, repeat cath, medical optimization.  Patient management required discussion with the following services or consulting groups:  Hospitalist Service and Cardiology  Complexity of Problems Addressed Acute illness or injury that poses threat of life of bodily function  Additional Data Reviewed and Analyzed Further history obtained from: Further history from spouse/family member  Additional Factors Impacting ED Encounter Risk Consideration of hospitalization  Ozell HERO. Theadore, MD [facility] Emergency Medicine [facility] [email or web address]  Final Clinical Impressions(s) / ED Diagnoses     ICD-10-CM   1. Chest pain, unspecified type  R07.9       ED Discharge Orders     None        Discharge Instructions Discussed with and Provided to Patient:   Discharge Instructions   None      Theadore Ozell HERO, MD 04/17/24 [phone]

## 2024-04-16 NOTE — Telephone Encounter (Signed)
 Agree. He has checked in to ER.

## 2024-04-16 NOTE — ED Triage Notes (Signed)
 Patient has been having chest tightness, nausea, and weakness since Friday, called his cardiology office and heard back 45 minutes ago and they suggested he come here due to his symptoms and cardiac history. Denis hx of MI

## 2024-04-17 ENCOUNTER — Encounter (HOSPITAL_COMMUNITY): Payer: Self-pay | Admitting: Internal Medicine

## 2024-04-17 ENCOUNTER — Other Ambulatory Visit: Payer: Self-pay

## 2024-04-17 DIAGNOSIS — Z743 Need for continuous supervision: Secondary | ICD-10-CM | POA: Diagnosis not present

## 2024-04-17 DIAGNOSIS — R0789 Other chest pain: Secondary | ICD-10-CM | POA: Diagnosis not present

## 2024-04-17 DIAGNOSIS — E119 Type 2 diabetes mellitus without complications: Secondary | ICD-10-CM

## 2024-04-17 DIAGNOSIS — R079 Chest pain, unspecified: Secondary | ICD-10-CM

## 2024-04-17 DIAGNOSIS — N179 Acute kidney failure, unspecified: Secondary | ICD-10-CM | POA: Diagnosis not present

## 2024-04-17 DIAGNOSIS — E1169 Type 2 diabetes mellitus with other specified complication: Secondary | ICD-10-CM | POA: Diagnosis not present

## 2024-04-17 DIAGNOSIS — G4733 Obstructive sleep apnea (adult) (pediatric): Secondary | ICD-10-CM | POA: Diagnosis not present

## 2024-04-17 DIAGNOSIS — I350 Nonrheumatic aortic (valve) stenosis: Secondary | ICD-10-CM | POA: Diagnosis not present

## 2024-04-17 LAB — HEMOGLOBIN A1C
Hgb A1c MFr Bld: 6.2 % — ABNORMAL HIGH (ref 4.8–5.6)
Mean Plasma Glucose: 131.24 mg/dL

## 2024-04-17 LAB — CBC
HCT: 42 % (ref 39.0–52.0)
Hemoglobin: 14.2 g/dL (ref 13.0–17.0)
MCH: 31.1 pg (ref 26.0–34.0)
MCHC: 33.8 g/dL (ref 30.0–36.0)
MCV: 92.1 fL (ref 80.0–100.0)
Platelets: 186 K/uL (ref 150–400)
RBC: 4.56 MIL/uL (ref 4.22–5.81)
RDW: 12.8 % (ref 11.5–15.5)
WBC: 6.9 K/uL (ref 4.0–10.5)
nRBC: 0 % (ref 0.0–0.2)

## 2024-04-17 LAB — GLUCOSE, CAPILLARY: Glucose-Capillary: 115 mg/dL — ABNORMAL HIGH (ref 70–99)

## 2024-04-17 LAB — TROPONIN T, HIGH SENSITIVITY: Troponin T High Sensitivity: <15 ng/L (ref 0–19)

## 2024-04-17 LAB — MAGNESIUM: Magnesium: 2.2 mg/dL (ref 1.7–2.4)

## 2024-04-17 LAB — CREATININE, SERUM
Creatinine, Ser: 1.4 mg/dL — ABNORMAL HIGH (ref 0.61–1.24)
GFR, Estimated: 53 mL/min — ABNORMAL LOW (ref >60)

## 2024-04-17 MED ORDER — ACETAMINOPHEN 325 MG PO TABS: 650.0000 mg | ORAL_TABLET | Freq: Four times a day (QID) | ORAL | Status: DC | PRN

## 2024-04-17 MED ORDER — ATORVASTATIN CALCIUM 10 MG PO TABS
20.0000 mg | ORAL_TABLET | Freq: Every day | ORAL | Status: DC
  Administered 2024-04-17 – 2024-04-18 (×2): 20 mg via ORAL
  Filled 2024-04-17 (×2): qty 2

## 2024-04-17 MED ORDER — FLUOXETINE HCL 20 MG PO CAPS
60.0000 mg | ORAL_CAPSULE | Freq: Every day | ORAL | Status: DC
  Administered 2024-04-18 – 2024-04-19 (×2): 60 mg via ORAL
  Filled 2024-04-17 (×2): qty 3

## 2024-04-17 MED ORDER — EZETIMIBE 10 MG PO TABS
10.0000 mg | ORAL_TABLET | Freq: Every evening | ORAL | Status: DC
  Administered 2024-04-17 – 2024-04-18 (×2): 10 mg via ORAL
  Filled 2024-04-17 (×2): qty 1

## 2024-04-17 MED ORDER — NITROGLYCERIN 0.4 MG SL SUBL: 0.4000 mg | SUBLINGUAL_TABLET | SUBLINGUAL | Status: DC | PRN

## 2024-04-17 MED ORDER — CLOPIDOGREL BISULFATE 75 MG PO TABS
75.0000 mg | ORAL_TABLET | Freq: Every day | ORAL | Status: DC
  Administered 2024-04-18 – 2024-04-19 (×2): 75 mg via ORAL
  Filled 2024-04-17 (×2): qty 1

## 2024-04-17 MED ORDER — ACETAMINOPHEN 650 MG RE SUPP: 650.0000 mg | Freq: Four times a day (QID) | RECTAL | Status: DC | PRN

## 2024-04-17 MED ORDER — AMLODIPINE BESYLATE 5 MG PO TABS
5.0000 mg | ORAL_TABLET | Freq: Every day | ORAL | Status: DC
  Administered 2024-04-17 – 2024-04-19 (×3): 5 mg via ORAL
  Filled 2024-04-17 (×3): qty 1

## 2024-04-17 MED ORDER — INSULIN ASPART 100 UNIT/ML IJ SOLN: 0.0000 [IU] | Freq: Three times a day (TID) | INTRAMUSCULAR | Status: DC

## 2024-04-17 MED ORDER — ASPIRIN 81 MG PO TBEC
81.0000 mg | DELAYED_RELEASE_TABLET | Freq: Every day | ORAL | Status: DC
  Administered 2024-04-18: 81 mg via ORAL
  Filled 2024-04-17 (×4): qty 1

## 2024-04-17 MED ORDER — ENOXAPARIN SODIUM 60 MG/0.6ML IJ SOSY
60.0000 mg | PREFILLED_SYRINGE | INTRAMUSCULAR | Status: DC
  Administered 2024-04-17 – 2024-04-18 (×2): 60 mg via SUBCUTANEOUS
  Filled 2024-04-17 (×2): qty 0.6

## 2024-04-17 MED ORDER — ASPIRIN 81 MG PO CHEW
324.0000 mg | CHEWABLE_TABLET | Freq: Once | ORAL | Status: AC
  Administered 2024-04-17: 324 mg via ORAL
  Filled 2024-04-17: qty 4

## 2024-04-17 NOTE — ED Provider Notes (Signed)
 Patient is currently still awaiting bed assignment for admission for high risk chest pain.  He is currently chest pain-free.  Will continue to monitor.   Lenor Hollering, MD 04/17/24 [phone]

## 2024-04-17 NOTE — Progress Notes (Signed)
 Hospitalist Transfer Note:    Nursing staff, Please call [facility] Admits & Consults System-Wide number on Amion [phone]) as soon as patient's arrival, so appropriate admitting provider can evaluate the pt.   Transferring facility: [facility] Requesting provider: Dr. Theadore (EDP at [facility]) Reason for transfer: admission for further evaluation and management of chest pain.   73 M w/ h/o CAD, who presented to [facility] ED complaining of  chest pain.   This patient, with a reported history of coronary artery disease, underwent left and right heart cath on 07/07/2023.  Since that time, he notes that he has been experiencing intermittent chest pain, which improves with sublingual nitroglycerin  as an outpatient.  However, over the last few days, he has noted increasing frequency of substernal chest pressure, which is now occurring at rest, worsening with exertion, and associated with some shortness of breath.  Vital signs in the ED were notable for the following: Afebrile; heart rates in the 60s to 70s; systolic blood pressures in the 120s to 140s, respiratory rate 16-18, and oxygen saturation 98 to 99% on room air.  EKG was reported showed no evidence of acute ischemic changes, including no evidence of STEMI.  Labs were notable for troponin x 2, which were both found to be less than 15.  Imaging notable for 2 view chest x-ray showed streaky basilar atelectasis, but otherwise no evidence of acute cardiopulmonary process.   EDP d/w on-call cardiology fellow, Dr. Gail, who recommended [facility] admission to [facility], and conveyed that cardiology will consult; Dr. Gail did not specifically recommend initiation of heparin  drip at this time.   Medications administered prior to transfer included the following: full dose aspirin  x 1 dose.    Subsequently, I accepted this patient for transfer for observation admission to a cardiac telemetry bed at [facility] for further work-up and management of the above.      Eva Pore,  DO Hospitalist

## 2024-04-17 NOTE — H&P (Signed)
 History and Physical    RHEA THRUN FMW:[id number] DOB: 05/17/1951 DOA: 04/16/2024  Patient coming from: Home.  Chief Complaint: Chest pain.  HPI: Scott Young is a 73 y.o. male with history of nonobstructive CAD per cardiac cath in January 2025, diabetes mellitus type 2, sleep apnea, hypertension, hyperlipidemia presents to the ER with complaint of chest pain.  Patient states over the last four days patient has been having exertional chest pain which gets relieved on rest.  Patient states he usually does household chores and also cleans his car but was unable to do the last few days because of the chest pressure.  Denies any associated shortness of breath had some nausea.  No vomiting or abdominal pain.  ED Course: In the ER patient's troponins were negative chest x-ray unremarkable EKG shows normal sinus rhythm.  Cardiology was consulted and patient admitted for further observation and further workup.  At the time of my exam patient is chest pain-free.  Labs show creatinine of 1.3 blood glucose of 125.  Review of Systems: As per HPI, rest all negative.   Past Medical History:  Diagnosis Date   Depression    H/O seasonal allergies    tx. OTC meds   Hyperlipidemia    Hypertension    Prostatitis    Sleep apnea    cpap   Umbilical hernia    resolved -s/p surgical repair    Past Surgical History:  Procedure Laterality Date   APPENDECTOMY     CATARACT EXTRACTION Left    30 yrs ago   COLONOSCOPY      multiple- x1 colon polyp removed   COLONOSCOPY WITH PROPOFOL  N/A 10/22/2014   Procedure: COLONOSCOPY WITH PROPOFOL ;  Surgeon: Gladis MARLA Louder, MD;  Location: [facility];  Service: Endoscopy;  Laterality: N/A;   CORONARY PRESSURE/FFR STUDY N/A 03/23/2022   Procedure: INTRAVASCULAR PRESSURE WIRE/FFR STUDY;  Surgeon: Claudene Victory ORN, MD;  Location: [facility];  Service: Cardiovascular;  Laterality: N/A;   CORONARY PRESSURE/FFR STUDY N/A 07/07/2023   Procedure: CORONARY  PRESSURE/FFR STUDY;  Surgeon: Ladona Heinz, MD;  Location: [facility];  Service: Cardiovascular;  Laterality: N/A;   EYE SURGERY     choriziom removed right eye 12'15   HERNIA REPAIR     umbilical hernia repair   LEFT HEART CATH AND CORONARY ANGIOGRAPHY N/A 07/04/2018   Procedure: LEFT HEART CATH AND CORONARY ANGIOGRAPHY;  Surgeon: Claudene Victory ORN, MD;  Location: [facility];  Service: Cardiovascular;  Laterality: N/A;   LEFT HEART CATH AND CORONARY ANGIOGRAPHY N/A 03/23/2022   Procedure: LEFT HEART CATH AND CORONARY ANGIOGRAPHY;  Surgeon: Claudene Victory ORN, MD;  Location: [facility];  Service: Cardiovascular;  Laterality: N/A;   RIGHT/LEFT HEART CATH AND CORONARY ANGIOGRAPHY N/A 07/07/2023   Procedure: RIGHT/LEFT HEART CATH AND CORONARY ANGIOGRAPHY;  Surgeon: Ladona Heinz, MD;  Location: [facility];  Service: Cardiovascular;  Laterality: N/A;   TONSILLECTOMY       reports that he has quit smoking. He quit smokeless tobacco use about 53 years ago. He reports that he does not drink alcohol and does not use drugs.  Allergies  Allergen Reactions   Codeine Nausea And Vomiting    Other Reaction(s): GI Intolerance   Metformin And Related Diarrhea and Nausea And Vomiting    Family History  Problem Relation Age of Onset   Heart disease Mother    Cancer Father        colon  Aneurysm Father    Aneurysm Brother     Prior to Admission medications   Medication Sig Start Date End Date Taking? Authorizing Provider  amLODipine  (NORVASC ) 5 MG tablet TAKE 1 TABLET BY MOUTH DAILY 03/01/24  Yes Dunn, Dayna N, PA-C  aspirin  81 MG tablet Take 81 mg by mouth at bedtime.    Yes [provider]  atorvastatin  (LIPITOR) 20 MG tablet Take 20 mg by mouth at bedtime. 12/19/20  Yes [provider]  carboxymethylcellulose (REFRESH TEARS) 0.5 % SOLN Place 1 drop into both eyes daily.   Yes [provider]  clopidogrel  (PLAVIX ) 75 MG tablet TAKE 1 TABLET BY MOUTH  DAILY  WITH BREAKFAST 01/17/24  Yes Dunn, Dayna N, PA-C  ezetimibe  (ZETIA ) 10 MG tablet Take 1 tablet (10 mg total) by mouth daily. Patient taking differently: Take 10 mg by mouth every evening. 10/24/18  Yes Claudene Victory ORN, MD  fexofenadine (ALLEGRA) 180 MG tablet Take 180 mg by mouth daily.   Yes [provider]  FLUoxetine  (PROZAC ) 20 MG capsule TAKE 3 CAPSULES BY MOUTH DAILY 01/25/24  Yes White, Brian A, NP  fluticasone  (FLONASE ) 50 MCG/ACT nasal spray Place 1 spray into both nostrils daily. 09/04/15  Yes [provider]  isosorbide  mononitrate (IMDUR ) 60 MG 24 hr tablet TAKE 1 TABLET BY MOUTH DAILY 04/10/24  Yes Dunn, Dayna N, PA-C  losartan (COZAAR) 25 MG tablet Take 25 mg by mouth daily. 09/02/15  Yes [provider]  nitroGLYCERIN  (NITROSTAT ) 0.4 MG SL tablet Place 1 tablet (0.4 mg total) under the tongue every 5 (five) minutes as needed for chest pain. 04/07/22  Yes Claudene Victory ORN, MD  TRULICITY 0.75 MG/0.5ML SOPN Inject 0.75 mg as directed once a week. Sunday 07/15/21  Yes [provider]  ONETOUCH VERIO test strip 1 each daily. 09/09/20   [provider]    Physical Exam: Constitutional: Moderately built and nourished. Vitals:   04/17/24 1700 04/17/24 1745 04/17/24 1800 04/17/24 1930  BP: (!) 144/71  (!) 154/94 (!) 145/91  Pulse: 72     Resp: 18  20 20   Temp:    97.8 F (36.6 C)  TempSrc:    Oral  SpO2: 96% 98%  97%  Weight:    121.5 kg  Height:    6' (1.829 m)   Eyes: Anicteric no pallor. ENMT: No discharge from the ears eyes nose or mouth. Neck: No mass felt.  No neck rigidity. Respiratory: No rhonchi or crepitations. Cardiovascular: S1-S2 heard. Abdomen: Soft nontender bowel sound present. Musculoskeletal: No edema. Skin: No rash. Neurologic: Alert awake oriented to time place and person.  Moves all extremities. Psychiatric: Appears normal.  Normal affect.   Labs on Admission: I have personally reviewed following labs and  imaging studies  CBC: Recent Labs  Lab 04/16/24 1750  WBC 7.0  HGB 14.1  HCT 42.2  MCV 92.1  PLT 191   Basic Metabolic Panel: Recent Labs  Lab 04/16/24 1750 04/16/24 2332  NA 138  --   K 4.3  --   CL 103  --   CO2 24  --   GLUCOSE 125*  --   BUN 17  --   CREATININE 1.33*  --   CALCIUM  9.7  --   MG  --  2.2   GFR: Estimated Creatinine Clearance: 65.6 mL/min (A) (by C-G formula based on SCr of 1.33 mg/dL (H)). Liver Function Tests: No results for input(s): AST, ALT, ALKPHOS, BILITOT, PROT, ALBUMIN in  the last 168 hours. No results for input(s): LIPASE, AMYLASE in the last 168 hours. No results for input(s): AMMONIA in the last 168 hours. Coagulation Profile: No results for input(s): INR, PROTIME in the last 168 hours. Cardiac Enzymes: No results for input(s): CKTOTAL, CKMB, CKMBINDEX, TROPONINI in the last 168 hours. BNP (last 3 results) No results for input(s): PROBNP in the last 8760 hours. HbA1C: No results for input(s): HGBA1C in the last 72 hours. CBG: No results for input(s): GLUCAP in the last 168 hours. Lipid Profile: No results for input(s): CHOL, HDL, LDLCALC, TRIG, CHOLHDL, LDLDIRECT in the last 72 hours. Thyroid  Function Tests: No results for input(s): TSH, T4TOTAL, FREET4, T3FREE, THYROIDAB in the last 72 hours. Anemia Panel: No results for input(s): VITAMINB12, FOLATE, FERRITIN, TIBC, IRON, RETICCTPCT in the last 72 hours. Urine analysis:    Component Value Date/Time   COLORURINE YELLOW 12/06/2011 1133   APPEARANCEUR CLEAR 12/06/2011 1133   LABSPEC 1.010 12/06/2011 1133   PHURINE 6.5 12/06/2011 1133   GLUCOSEU NEG 12/06/2011 1133   HGBUR NEG 12/06/2011 1133   BILIRUBINUR NEG 12/06/2011 1133   KETONESUR NEG 12/06/2011 1133   PROTEINUR NEG 12/06/2011 1133   UROBILINOGEN 0.2 12/06/2011 1133   NITRITE NEG 12/06/2011 1133   LEUKOCYTESUR NEG 12/06/2011 1133   Sepsis  Labs: @LABRCNTIP (procalcitonin:4,lacticidven:4) )No results found for this or any previous visit (from the past 240 hours).   Radiological Exams on Admission: DG Chest 2 View Result Date: 04/16/2024 EXAM: 2 VIEW(S) XRAY OF THE CHEST 04/16/2024 06:13:42 PM COMPARISON: None available. CLINICAL HISTORY: Chest pain, nausea, and weakness for 3 days. FINDINGS: LUNGS AND PLEURA: Streaky basilar atelectasis but no infiltrates. No pulmonary edema. No pleural effusion. No pneumothorax. HEART AND MEDIASTINUM: No acute abnormality of the cardiac and mediastinal silhouettes. BONES AND SOFT TISSUES: No acute fracture or destructive lesion. 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 IMPRESSION: 1. No acute cardiopulmonary process. 2. Streaky basilar atelectasis. Electronically signed by: Maude Stammer MD 04/16/2024 06:47 PM EST RP Workstation: [id number]    EKG: Independently reviewed.  Normal sinus rhythm.  Assessment/Plan Principal Problem:   Chest pain Active Problems:   OSA (obstructive sleep apnea)   AKI (acute kidney injury)   HTN (hypertension)   Depression   Diabetes mellitus, type 2 (HCC)    Chest pain -    with cardiac cath done in January 2025 showing moderate coronary artery disease especially involving the diagonal and LAD.  At the time medical management was recommended.  Cardiology has been consulted.  Will keep patient n.p.o. in anticipation of possible procedure.  Continue with antiplatelet agents statins. Hypertension on amlodipine , losartan and Imdur . Diabetes mellitus type II hemoglobin A1c 6.1.  Takes CT at home.  Presently on sliding scale coverage. Sleep apnea on CPAP at bedtime. History of depression on fluoxetine . Acute renal failure with creatinine mildly increased from baseline.  May have to hold ARB if creatinine worsens.  Since patient presenting with chest pain will need further workup will need more than 2 midnight stay.  DVT prophylaxis: Lovenox. Code  Status: Full code. Family Communication: Discussed with patient. Disposition Plan: Monitored bed. Consults called: Cardiology. Admission status: Observation.

## 2024-04-17 NOTE — ED Notes (Signed)
Iona Beard with cl called for transport

## 2024-04-17 NOTE — ED Notes (Signed)
Pt ambulatory to bathroom with minimal assistance

## 2024-04-18 ENCOUNTER — Observation Stay (HOSPITAL_COMMUNITY)

## 2024-04-18 DIAGNOSIS — R079 Chest pain, unspecified: Secondary | ICD-10-CM | POA: Diagnosis not present

## 2024-04-18 DIAGNOSIS — I10 Essential (primary) hypertension: Secondary | ICD-10-CM | POA: Diagnosis not present

## 2024-04-18 DIAGNOSIS — I2510 Atherosclerotic heart disease of native coronary artery without angina pectoris: Secondary | ICD-10-CM

## 2024-04-18 DIAGNOSIS — I350 Nonrheumatic aortic (valve) stenosis: Secondary | ICD-10-CM | POA: Diagnosis not present

## 2024-04-18 DIAGNOSIS — E785 Hyperlipidemia, unspecified: Secondary | ICD-10-CM | POA: Diagnosis not present

## 2024-04-18 LAB — CBC
HCT: 42.3 % (ref 39.0–52.0)
Hemoglobin: 14.2 g/dL (ref 13.0–17.0)
MCH: 30.7 pg (ref 26.0–34.0)
MCHC: 33.6 g/dL (ref 30.0–36.0)
MCV: 91.6 fL (ref 80.0–100.0)
Platelets: 181 K/uL (ref 150–400)
RBC: 4.62 MIL/uL (ref 4.22–5.81)
RDW: 12.8 % (ref 11.5–15.5)
WBC: 6.4 K/uL (ref 4.0–10.5)
nRBC: 0 % (ref 0.0–0.2)

## 2024-04-18 LAB — ECHOCARDIOGRAM COMPLETE
AR max vel: 1.57 cm2
AV Area VTI: 1.54 cm2
AV Area mean vel: 1.51 cm2
AV Mean grad: 12 mmHg
AV Peak grad: 21.3 mmHg
Ao pk vel: 2.31 m/s
Area-P 1/2: 2.54 cm2
Height: 72 in
S' Lateral: 2.7 cm
Weight: 4285.74 [oz_av]

## 2024-04-18 LAB — BASIC METABOLIC PANEL WITH GFR
Anion gap: 12 (ref 5–15)
BUN: 19 mg/dL (ref 8–23)
CO2: 24 mmol/L (ref 22–32)
Calcium: 8.9 mg/dL (ref 8.9–10.3)
Chloride: 102 mmol/L (ref 98–111)
Creatinine, Ser: 1.17 mg/dL (ref 0.61–1.24)
GFR, Estimated: >60 mL/min (ref >60)
Glucose, Bld: 111 mg/dL — ABNORMAL HIGH (ref 70–99)
Potassium: 3.9 mmol/L (ref 3.5–5.1)
Sodium: 138 mmol/L (ref 135–145)

## 2024-04-18 LAB — GLUCOSE, CAPILLARY
Glucose-Capillary: 106 mg/dL — ABNORMAL HIGH (ref 70–99)
Glucose-Capillary: 116 mg/dL — ABNORMAL HIGH (ref 70–99)
Glucose-Capillary: 108 mg/dL — ABNORMAL HIGH (ref 70–99)
Glucose-Capillary: 118 mg/dL — ABNORMAL HIGH (ref 70–99)

## 2024-04-18 MED ORDER — LOSARTAN POTASSIUM 25 MG PO TABS
25.0000 mg | ORAL_TABLET | Freq: Every day | ORAL | Status: DC
  Administered 2024-04-18 – 2024-04-19 (×2): 25 mg via ORAL
  Filled 2024-04-18 (×2): qty 1

## 2024-04-18 MED ORDER — INSULIN ASPART 100 UNIT/ML IJ SOLN: 0.0000 [IU] | Freq: Three times a day (TID) | INTRAMUSCULAR | Status: DC

## 2024-04-18 MED ORDER — ISOSORBIDE MONONITRATE ER 60 MG PO TB24
60.0000 mg | ORAL_TABLET | Freq: Every day | ORAL | Status: DC
  Administered 2024-04-18 – 2024-04-19 (×2): 60 mg via ORAL
  Filled 2024-04-18 (×2): qty 1

## 2024-04-18 NOTE — Consult Note (Addendum)
 Cardiology Consultation   Patient ID: [name] MRN: [id number]; DOB: 1951-04-03  Admit date: 04/16/2024 Date of Consult: 04/18/2024  PCP:  Rexanne Ingle, MD   [facility] Providers Cardiologist:  None        Patient Profile: Scott Young is a 73 y.o. male with a hx of CAD and, HTN, HLD, OSA on CPAP, first-degree heart block who is being seen 04/18/2024 for the evaluation of chest pain at the request of hospitalist.  History of Present Illness: Scott Young  is a 73 year old male with PMH of CAD, aortic stenosis, HTN, HLD ,OSA on CPAP, first degree AV block who presents today with concerns of constant chest pressure increased breathlessness with activity over the last four days. Patient was admitted in January of this year due to chest pain and had LHC/RHC at that time. This procedure did not show signs of pulmonary HTN and did not show any significant aortic stenosis. Did show moderate coronary artery disease involving left anterior descending. Echocardiogram at that time showed an EF of 60 to 65% with grade 1 diastolic dysfunction.  Recommendation at that time was for medical management with amlodipine  and imdur .  Patient states that since that time he has not had episodes of chest pain. He has noticed more recently that he has been getting more breathless with exertion. He states that since January he has had episodes of chest pressure which he is presenting with at this hospitalization, but he feels over the last four days this pressure has been constant. His main concern for presenting is due to the breathlessness he experiences with activity which is also associated with nausea and feeling jittery and weak. He states that these latter symptoms last for about 15-20 minutes now and go away after he rests during this time period. Patient states that he notices the chest pressure at rest but feels that the other symptoms he gets occur more with exertion.  Patient states that he  has been checking his blood sugar when he feels like this and states it is usually 80 at the lowest but usually runs in the 90s-100s. Patient has noticed occasional substernal chest pain that radiates to his left shoulder over the last four days but states this goes away almost instantly.  Patient endorses palpitations with these episodes as well.  He denies any orthopnea or PND.  He does endorse leg swelling that he notices more towards the end of the day but resolves by the time he wakes up in the morning.  Reports compliance with his CPAP nightly.   Past Medical History:  Diagnosis Date   Depression    H/O seasonal allergies    tx. OTC meds   Hyperlipidemia    Hypertension    Prostatitis    Sleep apnea    cpap   Umbilical hernia    resolved -s/p surgical repair    Past Surgical History:  Procedure Laterality Date   APPENDECTOMY     CATARACT EXTRACTION Left    30 yrs ago   COLONOSCOPY      multiple- x1 colon polyp removed   COLONOSCOPY WITH PROPOFOL  N/A 10/22/2014   Procedure: COLONOSCOPY WITH PROPOFOL ;  Surgeon: Gladis MARLA Louder, MD;  Location: [facility];  Service: Endoscopy;  Laterality: N/A;   CORONARY PRESSURE/FFR STUDY N/A 03/23/2022   Procedure: INTRAVASCULAR PRESSURE WIRE/FFR STUDY;  Surgeon: Claudene Victory ORN, MD;  Location: [facility];  Service: Cardiovascular;  Laterality: N/A;   CORONARY PRESSURE/FFR  STUDY N/A 07/07/2023   Procedure: CORONARY PRESSURE/FFR STUDY;  Surgeon: Ladona Heinz, MD;  Location: [facility];  Service: Cardiovascular;  Laterality: N/A;   EYE SURGERY     choriziom removed right eye 12'15   HERNIA REPAIR     umbilical hernia repair   LEFT HEART CATH AND CORONARY ANGIOGRAPHY N/A 07/04/2018   Procedure: LEFT HEART CATH AND CORONARY ANGIOGRAPHY;  Surgeon: Claudene Victory ORN, MD;  Location: [facility];  Service: Cardiovascular;  Laterality: N/A;   LEFT HEART CATH AND CORONARY ANGIOGRAPHY N/A 03/23/2022   Procedure: LEFT HEART CATH AND  CORONARY ANGIOGRAPHY;  Surgeon: Claudene Victory ORN, MD;  Location: [facility];  Service: Cardiovascular;  Laterality: N/A;   RIGHT/LEFT HEART CATH AND CORONARY ANGIOGRAPHY N/A 07/07/2023   Procedure: RIGHT/LEFT HEART CATH AND CORONARY ANGIOGRAPHY;  Surgeon: Ladona Heinz, MD;  Location: [facility];  Service: Cardiovascular;  Laterality: N/A;   TONSILLECTOMY         Scheduled Meds:  amLODipine   5 mg Oral Daily   aspirin  EC  81 mg Oral QHS   atorvastatin   20 mg Oral QHS   clopidogrel   75 mg Oral Q breakfast   enoxaparin (LOVENOX) injection  60 mg Subcutaneous Q24H   ezetimibe   10 mg Oral QPM   FLUoxetine   60 mg Oral Daily   insulin aspart  0-9 Units Subcutaneous TID WC   isosorbide  mononitrate  60 mg Oral Daily   losartan  25 mg Oral Daily   Continuous Infusions:  PRN Meds: acetaminophen  **OR** acetaminophen , nitroGLYCERIN   Allergies:    Allergies  Allergen Reactions   Codeine Nausea And Vomiting    Other Reaction(s): GI Intolerance   Metformin And Related Diarrhea and Nausea And Vomiting    Social History:   Social History   Socioeconomic History   Marital status: Married    Spouse name: Not on file   Number of children: Not on file   Years of education: Not on file   Highest education level: Not on file  Occupational History   Not on file  Tobacco Use   Smoking status: Former   Smokeless tobacco: Former    Quit date: 09/23/1970  Vaping Use   Vaping status: Never Used  Substance and Sexual Activity   Alcohol use: No   Drug use: No   Sexual activity: Not Currently  Other Topics Concern   Not on file  Social History Narrative   Not on file   Social Drivers of Health   Financial Resource Strain: Not on file  Food Insecurity: No Food Insecurity (04/17/2024)   Hunger Vital Sign    Worried About Running Out of Food in the Last Year: Never true    Ran Out of Food in the Last Year: Never true  Transportation Needs: No Transportation Needs (04/17/2024)    PRAPARE - Administrator, Civil Service (Medical): No    Lack of Transportation (Non-Medical): No  Physical Activity: Not on file  Stress: Not on file  Social Connections: Socially Integrated (04/17/2024)   Social Connection and Isolation Panel    Frequency of Communication with Friends and Family: More than three times a week    Frequency of Social Gatherings with Friends and Family: More than three times a week    Attends Religious Services: More than 4 times per year    Active Member of [employer] or Organizations: Yes    Attends Banker Meetings: More than  4 times per year    Marital Status: Married  Catering Manager Violence: Not At Risk (04/17/2024)   Humiliation, Afraid, Rape, and Kick questionnaire    Fear of Current or Ex-Partner: No    Emotionally Abused: No    Physically Abused: No    Sexually Abused: No    Family History:    Family History  Problem Relation Age of Onset   Heart disease Mother    Cancer Father        colon   Aneurysm Father    Aneurysm Brother      ROS:  Please see the history of present illness.  All other ROS reviewed and negative.     Physical Exam/Data: Vitals:   04/18/24 0400 04/18/24 0756 04/18/24 1025 04/18/24 1146  BP: (!) 153/94 (!) 151/85 (!) 160/84 126/82  Pulse: 72 68  67  Resp: 20 18  16   Temp: 97.9 F (36.6 C) 97.6 F (36.4 C)  97.9 F (36.6 C)  TempSrc: Oral Oral  Oral  SpO2: 98% 96%  95%  Weight:      Height:        Intake/Output Summary (Last 24 hours) at 04/18/2024 1517 Last data filed at 04/18/2024 1155 Gross per 24 hour  Intake 360 ml  Output 2275 ml  Net -1915 ml      04/17/2024    7:30 PM 04/16/2024    5:48 PM 07/07/2023   11:06 AM  Last 3 Weights  Weight (lbs) 267 lb 13.7 oz 264 lb 278 lb  Weight (kg) 121.5 kg 119.75 kg 126.1 kg     Body mass index is 36.33 kg/m.  General:  Well nourished, obese male in no acute distress HEENT: normal Neck: No JVD appreciated Vascular: No carotid  bruits; Distal pulses 2+ bilaterally Cardiac:  normal S1. S2 difficult to hear due to body habitus. ;regular rate; 2/6 systolic murmur at LUSB  Lungs:  clear to auscultation bilaterally, no wheezing, rhonchi or rales  Abd: soft, nontender, no hepatomegaly, no fluid wave appreciated  Ext: no lower extremity pitting edema Musculoskeletal:  No deformities, BUE and BLE strength normal and equal Skin: warm and dry  Neuro:  CNs 2-12 intact, no focal abnormalities noted Psych:  Normal affect   EKG:  The EKG was personally reviewed and demonstrates:  1 degree AV block  Telemetry:  Telemetry was personally reviewed and demonstrates:  1st degree AV block with occasional PVCs  Relevant CV Studies: Echo in February 2025 showed EF of 60 to 65% with no regional wall motion abnormalities and left ventricle and grade 1 diastolic dysfunction noted.  Left atrial size mildly dilated.  Trivial mitral regurgitation.  Calcification of the aortic valve is moderate with moderate thickening of the aortic valve.  Trivial aortic valve regurgitation.  Mild to moderate aortic stenosis  Right/left heart cath in January 2025 showed no evidence of pulmonary hypertension or aortic stenosis.  LAD with focal 60% stenosis in the mid segment D1 diffusely diseased with tandem 30% stenosis and D2 with ostial 50% stenosis.  OM 2 with proximal long segment 30 to 40% stenosis IMR 40 but no suggestion of microvascular macrovascular disease of significance  Laboratory Data: High Sensitivity Troponin:  No results for input(s): TROPONINIHS in the last 720 hours.   Chemistry Recent Labs  Lab 04/16/24 1750 04/16/24 2332 04/17/24 2133 04/18/24 0442  NA 138  --   --  138  K 4.3  --   --  3.9  CL 103  --   --  102  CO2 24  --   --  24  GLUCOSE 125*  --   --  111*  BUN 17  --   --  19  CREATININE 1.33*  --  1.40* 1.17  CALCIUM  9.7  --   --  8.9  MG  --  2.2  --   --   GFRNONAA 56*  --  53* >60  ANIONGAP 11  --   --  12    No  results for input(s): PROT, ALBUMIN, AST, ALT, ALKPHOS, BILITOT in the last 168 hours. Lipids No results for input(s): CHOL, TRIG, HDL, LABVLDL, LDLCALC, CHOLHDL in the last 168 hours.  Hematology Recent Labs  Lab 04/16/24 1750 04/17/24 2133 04/18/24 0442  WBC 7.0 6.9 6.4  RBC 4.58 4.56 4.62  HGB 14.1 14.2 14.2  HCT 42.2 42.0 42.3  MCV 92.1 92.1 91.6  MCH 30.8 31.1 30.7  MCHC 33.4 33.8 33.6  RDW 12.8 12.8 12.8  PLT 191 186 181   Thyroid  No results for input(s): TSH, FREET4 in the last 168 hours.  BNPNo results for input(s): BNP, PROBNP in the last 168 hours.  DDimer No results for input(s): DDIMER in the last 168 hours.  Radiology/Studies:  DG Chest 2 View Result Date: 04/16/2024 EXAM: 2 VIEW(S) XRAY OF THE CHEST 04/16/2024 06:13:42 PM COMPARISON: None available. CLINICAL HISTORY: Chest pain, nausea, and weakness for 3 days. FINDINGS: LUNGS AND PLEURA: Streaky basilar atelectasis but no infiltrates. No pulmonary edema. No pleural effusion. No pneumothorax. HEART AND MEDIASTINUM: No acute abnormality of the cardiac and mediastinal silhouettes. BONES AND SOFT TISSUES: No acute fracture or destructive lesion. 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 IMPRESSION: 1. No acute cardiopulmonary process. 2. Streaky basilar atelectasis. Electronically signed by: Maude Stammer MD 04/16/2024 06:47 PM EST RP Workstation: [id number]     Assessment and Plan: Scott Young  is a 73 year old male with PMH of CAD, aortic stenosis, HTN, HLD ,OSA on CPAP, first degree AV block who presented yesterday with concerns of constant chest pressure increased breathlessness with activity over the last four days and is admitted for further workup of these symptoms    Atypical chest pain Patient describes as pressure in the sternal region and noted worsening shortness of breath, and weakness with exertion.  Says the symptoms have lessened now that he is in the  hospital.  EKG does not show any signs of acute ischemic changes and troponins have been undetectable.  Lower likelihood of ACS at this data.  Patient has had mild to moderate aortic stenosis noted on echo in January.  Heart cath did not show any signs of significant aortic disease.  Body habitus makes it hard to fully appreciate heart sounds.  Some of the symptoms patient is having could be due to aortic stenosis but lower likelihood given previous objective data from heart cath and echo.  Would still like to rule out this as a cause of symptoms.  Patient that some of his symptoms with exertion could also be due to his body habitus.  Patient stated that he has lost about 20 pounds so far this year and is continuing to work on diet to help lose more.  -TTE ordered.  Will follow these results  2.  HTN Blood pressure seems to have normalized after restarting home medications. Continue amlodipine  5 mg and losartan 25 mg  3.  CAD Last heart cath in January showed moderate coronary artery disease involving the diagonal and LAD with  recommendation of medical therapy.  Patient received aspirin  324 mg in the ED.  No need for heparin  at this time. Continue aspirin  81 mg and Plavix  75 mg  4.OSA  - continue to use CPAP.  5.  Hyperlipidemia Continue atorvastatin  20 mg and ezetimibe  10 mg     Risk Assessment/Risk Scores:              For questions or updates, please contact [facility] Please consult [email or web address] for contact info under      Signed, Rosalyn D'Mello, DO  04/18/2024 3:17 PM   Patient seen, examined. Available data reviewed. Agree with findings, assessment, and plan as outlined by Dr Kem.  The patient is independently interviewed and examined.  He is alert, oriented, obese male in no distress.  HEENT is normal, JVP is normal, carotid upstrokes are normal without bruits, lungs are clear bilaterally, heart is regular rate and rhythm with distant heart sounds, 2/6  harsh crescendo decrescendo murmur at the right upper sternal border with absent A2, abdomen soft and nontender with no organomegaly, extremities with no edema, skin warm and dry with no rash.  EKG is normal sinus rhythm and within normal limits.  Labs are unremarkable including 2 high-sensitivity troponins less than 15.  The patient describes vague symptoms with physical activity that includes weakness, fatigue, and shortness of breath.  He has very mild chest pressure but states that his symptoms have been better since he has been placed on medical therapy with isosorbide  and amlodipine .  I went back and reviewed his cardiac catheterization and echo studies from January 2025.  He had a comprehensive cardiac cath study performed which showed no major abnormalities on right heart catheterization.  The mean gradient across the aortic valve was 14 mmHg and the patient was noted to have mild to moderate diffuse nonobstructive coronary artery disease with negative FFR.  The patient's echo showed normal LVEF of 60 to 65%, normal RV function, no significant mitral valve disease, and they tricuspid aortic valve with moderate calcification and mild to moderate aortic stenosis with mean transaortic gradient of 16 mmHg.  I think the patient's objective data is reassuring, especially during this hospitalization with negative troponins and a normal EKG.  We had a lengthy discussion about potential etiologies of his symptoms.  His exam does suggest at least moderate aortic stenosis and I think his echocardiogram should be updated to make sure he has not had rapid progression.  I am not able to hear aortic valve closure sounds.  Otherwise, I would anticipate medical therapy.  I discussed the impact of his weight and the importance of continuing to work on diet and exercise.  He states that he has lost 20 pounds.  Will follow-up after his echo is completed but I would not anticipate any further inpatient testing.  I do not think  the patient needs any ischemic testing with his recent heart catheterization, normal troponins, and no evidence of ACS.  Ozell Fell, M.D. 04/18/2024 4:19 PM

## 2024-04-18 NOTE — Care Management Obs Status (Signed)
 MEDICARE OBSERVATION STATUS NOTIFICATION   Patient Details  Name: Scott Young MRN: [id number] Date of Birth: [birth year]   Medicare Observation Status Notification Given:  Yes    [name] 04/18/2024, 10:05 AM

## 2024-04-18 NOTE — Progress Notes (Signed)
 PT placed on CPAP at this time.   04/18/24 0000  BiPAP/CPAP/SIPAP  $ Non-Invasive Ventilator  Non-Invasive Vent Initial  $ Face Mask Medium Yes  BiPAP/CPAP/SIPAP Pt Type Adult  BiPAP/CPAP/SIPAP Resmed  Mask Type Full face mask  Dentures removed? Not applicable  Mask Size Medium  Set Rate 0 breaths/min  Respiratory Rate 22 breaths/min  IPAP 0 cmH20  EPAP 0 cmH2O  Pressure Support 7 cmH20  PEEP 0 cmH20  FiO2 (%) 21 %  Flow Rate 0 lpm  Patient Home Machine No  Patient Home Mask No  Patient Home Tubing No  Auto Titrate No  Press High Alarm 15 cmH2O  Press Low Alarm 5 cmH2O  Nasal massage performed Yes  CPAP/SIPAP surface wiped down Yes  Device Plugged into RED Power Outlet Yes

## 2024-04-18 NOTE — Plan of Care (Signed)

## 2024-04-18 NOTE — Progress Notes (Signed)
  Echocardiogram 2D Echocardiogram has been performed.  Jadine Brumley 04/18/2024, 4:33 PM

## 2024-04-18 NOTE — Progress Notes (Signed)
PT refused CPAP for the night.

## 2024-04-18 NOTE — Progress Notes (Signed)
 [facility] PROGRESS NOTE    Progress Note  Scott Young  FMW:[id number] DOB: [birth year] DOA: 04/16/2024 PCP: Rexanne Ingle, MD     Brief Narrative:   Scott Young is an 73 y.o. male past medical history of nonobstructive CAD by cath in January 2025 no aortic stenosis at that time, diabetes mellitus type 2, essential hypertension comes into the ED complaining of chest pain that has been going on for almost a year   Assessment/Plan:   Atypical Chest pain Patient is currently chest pain-free, cardiac biomarkers have been negative. Twelve-lead EKG showed normal sinus rhythm normal axis nonspecific T wave changes unchanged from previous EKG. Cardiology to see.  Essential hypertension: Continue amlodipine  and Imdur . Holding ARB  Diabetes mellitus type 2: With an A1c of 6.1. Continue sliding scale insulin.  OSA (obstructive sleep apnea) Continue CPAP.  Chronic kidney disease stage IIIa: Acute kidney injury has been ruled out.  Depression Continue Prozac .     DVT prophylaxis: lovenox Family Communication:none Status is: Observation The patient remains OBS appropriate and will d/c before 2 midnights.    Code Status:     Code Status Orders  (From admission, onward)           Start     Ordered   04/17/24 2011  Full code  Continuous       Question:  By:  Answer:  Consent: discussion documented in EHR   04/17/24 2011           Code Status History     Date Active Date Inactive Code Status Order ID Comments User Context   07/07/2023 1412 07/07/2023 2123 Full Code [id number]  Ladona Heinz, MD Inpatient   03/23/2022 1350 03/23/2022 2118 Full Code [id number]  Claudene Victory ORN, MD Inpatient   07/03/2018 1956 07/05/2018 1314 Full Code [id number]  Alfornia Madison, MD ED      Advance Directive Documentation    Flowsheet Row Most Recent Value  Type of Advance Directive Healthcare Power of Attorney, Living will  Pre-existing out of facility DNR order  (yellow form or pink MOST form) --  MOST Form in Place? --      IV Access:   Peripheral IV   Procedures and diagnostic studies:   DG Chest 2 View Result Date: 04/16/2024 EXAM: 2 VIEW(S) XRAY OF THE CHEST 04/16/2024 06:13:42 PM COMPARISON: None available. CLINICAL HISTORY: Chest pain, nausea, and weakness for 3 days. FINDINGS: LUNGS AND PLEURA: Streaky basilar atelectasis but no infiltrates. No pulmonary edema. No pleural effusion. No pneumothorax. HEART AND MEDIASTINUM: No acute abnormality of the cardiac and mediastinal silhouettes. BONES AND SOFT TISSUES: No acute fracture or destructive lesion. 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 IMPRESSION: 1. No acute cardiopulmonary process. 2. Streaky basilar atelectasis. Electronically signed by: Maude Stammer MD 04/16/2024 06:47 PM EST RP Workstation: [id number]     Medical Consultants:   None.   Subjective:    Scott Young currently chest pain-free  Objective:    Vitals:   04/17/24 1930 04/17/24 2100 04/17/24 2341 04/18/24 0400  BP: (!) 145/91 (!) 144/92 (!) 141/85 (!) 153/94  Pulse:  72 73 72  Resp: 20  20 20   Temp: 97.8 F (36.6 C)  98.2 F (36.8 C) 97.9 F (36.6 C)  TempSrc: Oral  Oral Oral  SpO2: 97%  97% 98%  Weight: 121.5 kg     Height: 6' (1.829 m)      SpO2: 98 % FiO2 (%): 21 %   Intake/Output Summary (  Last 24 hours) at 04/18/2024 0601 Last data filed at 04/18/2024 0400 Gross per 24 hour  Intake 360 ml  Output 900 ml  Net -540 ml   Filed Weights   04/16/24 1748 04/17/24 1930  Weight: 119.7 kg 121.5 kg    Exam: General exam: In no acute distress. Respiratory system: Good air movement and clear to auscultation. Cardiovascular system: S1 & S2 heard, RRR. No JVD. Gastrointestinal system: Abdomen is nondistended, soft and nontender.  Extremities: No pedal edema. Skin: No rashes, lesions or ulcers Psychiatry: Judgement and insight appear normal. Mood & affect appropriate.     Data Reviewed:    Labs: Basic Metabolic Panel: Recent Labs  Lab 04/16/24 1750 04/16/24 2332 04/17/24 2133  NA 138  --   --   K 4.3  --   --   CL 103  --   --   CO2 24  --   --   GLUCOSE 125*  --   --   BUN 17  --   --   CREATININE 1.33*  --  1.40*  CALCIUM  9.7  --   --   MG  --  2.2  --    GFR Estimated Creatinine Clearance: 62.3 mL/min (A) (by C-G formula based on SCr of 1.4 mg/dL (H)). Liver Function Tests: No results for input(s): AST, ALT, ALKPHOS, BILITOT, PROT, ALBUMIN in the last 168 hours. No results for input(s): LIPASE, AMYLASE in the last 168 hours. No results for input(s): AMMONIA in the last 168 hours. Coagulation profile No results for input(s): INR, PROTIME in the last 168 hours. COVID-19 Labs  No results for input(s): DDIMER, FERRITIN, LDH, CRP in the last 72 hours.  No results found for: SARSCOV2NAA  CBC: Recent Labs  Lab 04/16/24 1750 04/17/24 2133 04/18/24 0442  WBC 7.0 6.9 6.4  HGB 14.1 14.2 14.2  HCT 42.2 42.0 42.3  MCV 92.1 92.1 91.6  PLT 191 186 181   Cardiac Enzymes: No results for input(s): CKTOTAL, CKMB, CKMBINDEX, TROPONINI in the last 168 hours. BNP (last 3 results) No results for input(s): PROBNP in the last 8760 hours. CBG: Recent Labs  Lab 04/17/24 2126  GLUCAP 115*   D-Dimer: No results for input(s): DDIMER in the last 72 hours. Hgb A1c: Recent Labs    04/17/24 2133  HGBA1C 6.2*   Lipid Profile: No results for input(s): CHOL, HDL, LDLCALC, TRIG, CHOLHDL, LDLDIRECT in the last 72 hours. Thyroid  function studies: No results for input(s): TSH, T4TOTAL, T3FREE, THYROIDAB in the last 72 hours.  Invalid input(s): FREET3 Anemia work up: No results for input(s): VITAMINB12, FOLATE, FERRITIN, TIBC, IRON, RETICCTPCT in the last 72 hours. Sepsis Labs: Recent Labs  Lab 04/16/24 1750 04/17/24 2133 04/18/24 0442  WBC 7.0 6.9 6.4    Microbiology No results found for this or any previous visit (from the past 240 hours).   Medications:    amLODipine   5 mg Oral Daily   aspirin  EC  81 mg Oral QHS   atorvastatin   20 mg Oral QHS   clopidogrel   75 mg Oral Q breakfast   enoxaparin (LOVENOX) injection  60 mg Subcutaneous Q24H   ezetimibe   10 mg Oral QPM   FLUoxetine   60 mg Oral Daily   insulin aspart  0-9 Units Subcutaneous TID WC   isosorbide  mononitrate  60 mg Oral Daily   losartan  25 mg Oral Daily   Continuous Infusions:    LOS: 0 days   Erle Odell Castor  [facility]  04/18/2024, 6:01 AM

## 2024-04-18 NOTE — Progress Notes (Signed)
 Patient requested CPAP to be taken off. Patient verbalized that the mask is uncomfortable and that he will have his wife bring in mask and machine in from home tomorrow.

## 2024-04-19 DIAGNOSIS — R079 Chest pain, unspecified: Secondary | ICD-10-CM | POA: Diagnosis not present

## 2024-04-19 DIAGNOSIS — I2510 Atherosclerotic heart disease of native coronary artery without angina pectoris: Secondary | ICD-10-CM | POA: Diagnosis not present

## 2024-04-19 DIAGNOSIS — G4733 Obstructive sleep apnea (adult) (pediatric): Secondary | ICD-10-CM | POA: Diagnosis not present

## 2024-04-19 LAB — BASIC METABOLIC PANEL WITH GFR
Anion gap: 11 (ref 5–15)
BUN: 19 mg/dL (ref 8–23)
CO2: 22 mmol/L (ref 22–32)
Calcium: 8.7 mg/dL — ABNORMAL LOW (ref 8.9–10.3)
Chloride: 101 mmol/L (ref 98–111)
Creatinine, Ser: 1.24 mg/dL (ref 0.61–1.24)
GFR, Estimated: >60 mL/min (ref >60)
Glucose, Bld: 158 mg/dL — ABNORMAL HIGH (ref 70–99)
Potassium: 3.9 mmol/L (ref 3.5–5.1)
Sodium: 134 mmol/L — ABNORMAL LOW (ref 135–145)

## 2024-04-19 LAB — GLUCOSE, CAPILLARY: Glucose-Capillary: 89 mg/dL (ref 70–99)

## 2024-04-19 NOTE — Plan of Care (Signed)

## 2024-04-19 NOTE — Discharge Summary (Signed)
 Physician Discharge Summary  Scott Young FMW:[id number] DOB: [birth year] DOA: 04/16/2024  PCP: Scott Ingle, MD  Admit date: 04/16/2024 Discharge date: 04/19/2024  Admitted From: Home Disposition:  Home  Recommendations for Outpatient Follow-up:  Follow up with PCP in 1-2 weeks Please obtain BMP/CBC in one week   Home Health:No Equipment/Devices:None  Discharge Condition:Stable CODE STATUS:Full Diet recommendation: Heart Healthy  Brief/Interim Summary: 73 y.o. male past medical history of nonobstructive CAD by cath in January 2025 no aortic stenosis at that time, diabetes mellitus type 2, essential hypertension comes into the ED complaining of chest pain that has been going on for almost a year.  Discharge Diagnoses:  Principal Problem:   Chest pain Active Problems:   OSA (obstructive sleep apnea)   AKI (acute kidney injury)   HTN (hypertension)   Depression   Diabetes mellitus, type 2 (HCC)   Nonrheumatic aortic valve stenosis  Atypical chest pain: Twelve-lead EKG showed no evidence of ischemia. Cardiac biomarkers are negative. 2D echo showed an EF mild aortic stenosis with valve gradient of 12 mmhg. Cardiology recommended no further ischemic workup follow-up with them as an outpatient.  Essential hypertension:  No changes made to his medication.  Diabetes mellitus type 2: No changes made to his medication A1c of 6.1. He is lost about 20 pounds.  Struct of sleep apnea: Continue CPAP at night.  Chronic kidney see stage IIIa: Creatinine remains at baseline.  Depression: Continue Prozac .     Discharge Instructions  Discharge Instructions     Diet - low sodium heart healthy   Complete by: As directed    Increase activity slowly   Complete by: As directed       Allergies as of 04/19/2024       Reactions   Codeine Nausea And Vomiting   Other Reaction(s): GI Intolerance   Metformin And Related Diarrhea, Nausea And Vomiting        Medication  List     TAKE these medications    amLODipine  5 MG tablet Commonly known as: NORVASC  TAKE 1 TABLET BY MOUTH DAILY   aspirin  81 MG tablet Take 81 mg by mouth at bedtime.   atorvastatin  20 MG tablet Commonly known as: LIPITOR Take 20 mg by mouth at bedtime.   clopidogrel  75 MG tablet Commonly known as: PLAVIX  TAKE 1 TABLET BY MOUTH DAILY  WITH BREAKFAST   ezetimibe  10 MG tablet Commonly known as: ZETIA  Take 1 tablet (10 mg total) by mouth daily. What changed: when to take this   fexofenadine 180 MG tablet Commonly known as: ALLEGRA Take 180 mg by mouth daily.   FLUoxetine  20 MG capsule Commonly known as: PROZAC  TAKE 3 CAPSULES BY MOUTH DAILY   fluticasone  50 MCG/ACT nasal spray Commonly known as: FLONASE  Place 1 spray into both nostrils daily.   isosorbide  mononitrate 60 MG 24 hr tablet Commonly known as: IMDUR  TAKE 1 TABLET BY MOUTH DAILY   losartan 25 MG tablet Commonly known as: COZAAR Take 25 mg by mouth daily.   nitroGLYCERIN  0.4 MG SL tablet Commonly known as: NITROSTAT  Place 1 tablet (0.4 mg total) under the tongue every 5 (five) minutes as needed for chest pain.   OneTouch Verio test strip Generic drug: glucose blood 1 each daily.   Refresh Tears 0.5 % Soln Generic drug: carboxymethylcellulose Place 1 drop into both eyes daily.   Trulicity 0.75 MG/0.5ML Soaj Generic drug: Dulaglutide Inject 0.75 mg as directed once a week. Sunday  Allergies  Allergen Reactions   Codeine Nausea And Vomiting    Other Reaction(s): GI Intolerance   Metformin And Related Diarrhea and Nausea And Vomiting    Consultations: Cardiology   Procedures/Studies: ECHOCARDIOGRAM COMPLETE Result Date: 04/18/2024    ECHOCARDIOGRAM REPORT   Patient Name:   Scott Young Date of Exam: 04/18/2024 Medical Rec #:  [id number]       Height:       72.0 in Accession #:    [id number]      Weight:       267.9 lb Date of Birth:  1951/04/12       BSA:          2.412 m  Patient Age:    73 years        BP:           119/76 mmHg Patient Gender: M               HR:           62 bpm. Exam Location:  Inpatient Procedure: 2D Echo (Both Spectral and Color Flow Doppler were utilized during            procedure). Indications:    aortic stenosis.  History:        Patient has prior history of Echocardiogram examinations, most                 recent 07/25/2023. Risk Factors:Sleep Apnea, Hypertension and                 Diabetes.  Sonographer:    Tinnie Barefoot RDCS Referring Phys: [phone] Scott Young [name] IMPRESSIONS  1. Left ventricular ejection fraction, by estimation, is 60 to 65%. The left ventricle has normal function. The left ventricle has no regional wall motion abnormalities. There is mild concentric left ventricular hypertrophy. Left ventricular diastolic parameters are consistent with Grade I diastolic dysfunction (impaired relaxation).  2. Right ventricular systolic function is normal. The right ventricular size is normal. There is normal pulmonary artery systolic pressure.  3. Left atrial size was moderately dilated.  4. The mitral valve is normal in structure. No evidence of mitral valve regurgitation. No evidence of mitral stenosis.  5. The aortic valve is normal in structure. There is mild calcification of the aortic valve. There is mild thickening of the aortic valve. Aortic valve regurgitation is not visualized. Mild aortic valve stenosis. Aortic valve area, by VTI measures 1.54 cm. Aortic valve mean gradient measures 12.0 mmHg. Aortic valve Vmax measures 2.31 m/s.  6. The inferior vena cava is normal in size with greater than 50% respiratory variability, suggesting right atrial pressure of 3 mmHg. FINDINGS  Left Ventricle: Left ventricular ejection fraction, by estimation, is 60 to 65%. The left ventricle has normal function. The left ventricle has no regional wall motion abnormalities. The left ventricular internal cavity size was normal in size. There is  mild  concentric left ventricular hypertrophy. Left ventricular diastolic parameters are consistent with Grade I diastolic dysfunction (impaired relaxation). Right Ventricle: The right ventricular size is normal. No increase in right ventricular wall thickness. Right ventricular systolic function is normal. There is normal pulmonary artery systolic pressure. The tricuspid regurgitant velocity is 1.56 m/s, and  with an assumed right atrial pressure of 3 mmHg, the estimated right ventricular systolic pressure is 12.7 mmHg. Left Atrium: Left atrial size was moderately dilated. Right Atrium: Right atrial size was normal in size. Pericardium: There is no evidence of pericardial  effusion. Mitral Valve: The mitral valve is normal in structure. No evidence of mitral valve regurgitation. No evidence of mitral valve stenosis. Tricuspid Valve: The tricuspid valve is normal in structure. Tricuspid valve regurgitation is trivial. No evidence of tricuspid stenosis. Aortic Valve: The aortic valve is normal in structure. There is mild calcification of the aortic valve. There is mild thickening of the aortic valve. Aortic valve regurgitation is not visualized. Mild aortic stenosis is present. Aortic valve mean gradient measures 12.0 mmHg. Aortic valve peak gradient measures 21.3 mmHg. Aortic valve area, by VTI measures 1.54 cm. Pulmonic Valve: The pulmonic valve was normal in structure. Pulmonic valve regurgitation is not visualized. No evidence of pulmonic stenosis. Aorta: The aortic root is normal in size and structure. Venous: The inferior vena cava is normal in size with greater than 50% respiratory variability, suggesting right atrial pressure of 3 mmHg. IAS/Shunts: No atrial level shunt detected by color flow Doppler.  LEFT VENTRICLE PLAX 2D LVIDd:         4.40 cm   Diastology LVIDs:         2.70 cm   LV e' medial:    5.33 cm/s LV PW:         1.10 cm   LV E/e' medial:  13.5 LV IVS:        1.10 cm   LV e' lateral:   6.42 cm/s LVOT  diam:     2.30 cm   LV E/e' lateral: 11.2 LV SV:         73 LV SV Index:   30 LVOT Area:     4.15 cm  RIGHT VENTRICLE             IVC RV S prime:     11.20 cm/s  IVC diam: 1.60 cm TAPSE (M-mode): 1.5 cm LEFT ATRIUM             Index LA diam:        3.50 cm 1.45 cm/m LA Vol (A2C):   49.7 ml 20.60 ml/m LA Vol (A4C):   84.1 ml 34.86 ml/m LA Biplane Vol: 70.4 ml 29.18 ml/m  AORTIC VALVE AV Area (Vmax):    1.57 cm AV Area (Vmean):   1.51 cm AV Area (VTI):     1.54 cm AV Vmax:           231.00 cm/s AV Vmean:          157.000 cm/s AV VTI:            0.474 m AV Peak Grad:      21.3 mmHg AV Mean Grad:      12.0 mmHg LVOT Vmax:         87.40 cm/s LVOT Vmean:        57.150 cm/s LVOT VTI:          0.176 m LVOT/AV VTI ratio: 0.37  AORTA Ao Root diam: 3.50 cm Ao Asc diam:  3.50 cm MITRAL VALVE               TRICUSPID VALVE MV Area (PHT): 2.54 cm    TR Peak grad:   9.7 mmHg MV Decel Time: 299 msec    TR Vmax:        156.00 cm/s MV E velocity: 72.10 cm/s MV A velocity: 91.50 cm/s  SHUNTS MV E/A ratio:  0.79        Systemic VTI:  0.18 m  Systemic Diam: 2.30 cm Annabella Scarce MD Electronically signed by Annabella Scarce MD Signature Date/Time: 04/18/2024/5:47:29 PM    Final    DG Chest 2 View Result Date: 04/16/2024 EXAM: 2 VIEW(S) XRAY OF THE CHEST 04/16/2024 06:13:42 PM COMPARISON: None available. CLINICAL HISTORY: Chest pain, nausea, and weakness for 3 days. FINDINGS: LUNGS AND PLEURA: Streaky basilar atelectasis but no infiltrates. No pulmonary edema. No pleural effusion. No pneumothorax. HEART AND MEDIASTINUM: No acute abnormality of the cardiac and mediastinal silhouettes. BONES AND SOFT TISSUES: No acute fracture or destructive lesion. 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 IMPRESSION: 1. No acute cardiopulmonary process. 2. Streaky basilar atelectasis. Electronically signed by: Maude Stammer MD 04/16/2024 06:47 PM EST RP Workstation: [id number]    Subjective: No  complaints  Discharge Exam: Vitals:   04/19/24 0725 04/19/24 0910  BP: 131/79 136/73  Pulse: 65   Resp: 16   Temp: 97.6 F (36.4 C)   SpO2: 96%    Vitals:   04/18/24 2307 04/19/24 0320 04/19/24 0725 04/19/24 0910  BP: 121/74 130/77 131/79 136/73  Pulse:  66 65   Resp: 20 18 16    Temp: 98.1 F (36.7 C) 97.6 F (36.4 C) 97.6 F (36.4 C)   TempSrc: Oral Oral Oral   SpO2: 98% 99% 96%   Weight:      Height:        General: Pt is alert, awake, not in acute distress Cardiovascular: RRR, S1/S2 +, no rubs, no gallops Respiratory: CTA bilaterally, no wheezing, no rhonchi Abdominal: Soft, NT, ND, bowel sounds + Extremities: no edema, no cyanosis    The results of significant diagnostics from this hospitalization (including imaging, microbiology, ancillary and laboratory) are listed below for reference.     Microbiology: No results found for this or any previous visit (from the past 240 hours).   Labs: BNP (last 3 results) No results for input(s): BNP in the last 8760 hours. Basic Metabolic Panel: Recent Labs  Lab 04/16/24 1750 04/16/24 2332 04/17/24 2133 04/18/24 0442  NA 138  --   --  138  K 4.3  --   --  3.9  CL 103  --   --  102  CO2 24  --   --  24  GLUCOSE 125*  --   --  111*  BUN 17  --   --  19  CREATININE 1.33*  --  1.40* 1.17  CALCIUM  9.7  --   --  8.9  MG  --  2.2  --   --    Liver Function Tests: No results for input(s): AST, ALT, ALKPHOS, BILITOT, PROT, ALBUMIN in the last 168 hours. No results for input(s): LIPASE, AMYLASE in the last 168 hours. No results for input(s): AMMONIA in the last 168 hours. CBC: Recent Labs  Lab 04/16/24 1750 04/17/24 2133 04/18/24 0442  WBC 7.0 6.9 6.4  HGB 14.1 14.2 14.2  HCT 42.2 42.0 42.3  MCV 92.1 92.1 91.6  PLT 191 186 181   Cardiac Enzymes: No results for input(s): CKTOTAL, CKMB, CKMBINDEX, TROPONINI in the last 168 hours. BNP: Invalid input(s): POCBNP CBG: Recent Labs   Lab 04/18/24 0754 04/18/24 1144 04/18/24 1550 04/18/24 2056 04/19/24 0723  GLUCAP 106* 116* 108* 118* 89   D-Dimer No results for input(s): DDIMER in the last 72 hours. Hgb A1c Recent Labs    04/17/24 2133  HGBA1C 6.2*   Lipid Profile No results for input(s): CHOL, HDL, LDLCALC, TRIG, CHOLHDL, LDLDIRECT in the last 72  hours. Thyroid  function studies No results for input(s): TSH, T4TOTAL, T3FREE, THYROIDAB in the last 72 hours.  Invalid input(s): FREET3 Anemia work up No results for input(s): VITAMINB12, FOLATE, FERRITIN, TIBC, IRON, RETICCTPCT in the last 72 hours. Urinalysis    Component Value Date/Time   COLORURINE YELLOW 12/06/2011 1133   APPEARANCEUR CLEAR 12/06/2011 1133   LABSPEC 1.010 12/06/2011 1133   PHURINE 6.5 12/06/2011 1133   GLUCOSEU NEG 12/06/2011 1133   HGBUR NEG 12/06/2011 1133   BILIRUBINUR NEG 12/06/2011 1133   KETONESUR NEG 12/06/2011 1133   PROTEINUR NEG 12/06/2011 1133   UROBILINOGEN 0.2 12/06/2011 1133   NITRITE NEG 12/06/2011 1133   LEUKOCYTESUR NEG 12/06/2011 1133   Sepsis Labs Recent Labs  Lab 04/16/24 1750 04/17/24 2133 04/18/24 0442  WBC 7.0 6.9 6.4   Microbiology No results found for this or any previous visit (from the past 240 hours).   Time coordinating discharge: Over 30 minutes  SIGNED:   Erle Odell Castor, MD  [facility] 04/19/2024, 9:54 AM Pager   If 7PM-7AM, please contact night-coverage [email or web address] Password [email or web address]

## 2024-04-19 NOTE — Progress Notes (Addendum)
 Progress Note  Patient Name: Scott Young Date of Encounter: 04/19/2024 [facility] Cardiologist:  Dr. Ladona   Interval Summary   Patient states this morning when he has been walking around in his room here in the hospital, he has not had any reproduction of his symptoms. He denies any chest pain but still endorses constant chest pressure. He says he usually only gets tired with 30-40 minutes of activity so he is not doing enough activity in the hospital to reproduce these symptoms. Patient is happy to here that his aortic valve looks stable. He feels comfortable going home at this time.   Vital Signs Vitals:   04/18/24 2307 04/19/24 0320 04/19/24 0725 04/19/24 0910  BP: 121/74 130/77 131/79 136/73  Pulse:  66 65   Resp: 20 18 16    Temp: 98.1 F (36.7 C) 97.6 F (36.4 C) 97.6 F (36.4 C)   TempSrc: Oral Oral Oral   SpO2: 98% 99% 96%   Weight:      Height:        Intake/Output Summary (Last 24 hours) at 04/19/2024 1035 Last data filed at 04/19/2024 0320 Gross per 24 hour  Intake --  Output 1550 ml  Net -1550 ml      04/17/2024    7:30 PM 04/16/2024    5:48 PM 07/07/2023   11:06 AM  Last 3 Weights  Weight (lbs) 267 lb 13.7 oz 264 lb 278 lb  Weight (kg) 121.5 kg 119.75 kg 126.1 kg      Telemetry/ECG  1st degree AV block but otherwise stable. Unchanged from previous EKGs  - Personally Reviewed  Physical Exam  GEN: No acute distress.   Neck: No JVD Cardiac: RRR, 2/6 Systolic murmur at RUSB Respiratory: Clear to auscultation bilaterally. GI: Soft, nontender, non-distended but protuberant abdomen  MS: No edema  Assessment & Plan  Atypical chest pain  Patient describes stable chest pressure, but no chest pain or tightness. Echo shows EF 60 to 65% with Grade 1 diastolic dysfunction. Unchanged from previous echo earlier this year. Still notes mild calcification of aortic valve and mild thickening. Mild aortic valve stenosis with mean gradient being 12.0 and vmax  measuring 2.31.  -Can proceed with yearly echos, but will cancel echo patient had scheduled for February - did discuss with patient that continuing weight loss strategy with diet and exercise will continue to help his symptoms  2.  HTN Blood pressure seems to have normalized after restarting home medications. Continue amlodipine  5 mg and losartan 25 mg   3.  CAD Last heart cath in January showed moderate coronary artery disease involving the diagonal and LAD with recommendation of medical therapy.  Patient received aspirin  324 mg in the ED.   Continue plavix  75 mg. Can consider stopping aspirin  81 mg with no previous stroke history, no PCI    4.OSA  - continue to use CPAP. Patient states he will get follow up with sleep medicine for new prescription he is supposed to be getting   5.  Hyperlipidemia Continue atorvastatin  20 mg and ezetimibe  10 mg  [facility] will sign off.   The patient is ready for discharge today from a cardiac standpoint. Medication Recommendations:  weight loss, atorvastatin  20mg , ezetimibe  10 mg, amlodipine  5 mg and losartan 25 mg, plavix  75 mg  Other recommendations (labs, testing, etc):  none Follow up as an outpatient:  in the next 2-3 months For questions or updates, please contact [facility] Please consult [email or web address]  for contact info under         Signed, Rosalyn D'Mello, DO    Patient seen, examined. Available data reviewed. Agree with findings, assessment, and plan as outlined by Dr Kem.  The patient is independently interviewed and examined.  He is alert, oriented, in no distress.  HEENT is normal, JVP is normal, lungs are clear bilaterally, heart is regular rate and rhythm with a 2/6 crescendo decrescendo murmur at the right upper sternal border, abdomen is soft and nontender, extremities have no edema.  The patient's echocardiogram is reviewed and demonstrates normal LVEF of 60 to 65%, grade 1 diastolic dysfunction, normal RV  function, no evidence of mitral regurgitation, and mild aortic stenosis with a mean transvalvular gradient of 12 mmHg.  The patient's hospital evaluation has been reassuring.  We discussed a diet and exercise regimen for him.  He does not appear to have an ongoing indication for DAPT and would favor continuation of clopidogrel  with discontinuation of aspirin .  Will arrange outpatient cardiology follow-up.  Please call if any questions.  Ozell Fell, M.D. 04/19/2024 11:13 AM

## 2024-04-19 NOTE — TOC CM/SW Note (Signed)
 Transition of Care [facility]) - Inpatient Brief Assessment   Patient Details  Name: Scott Young MRN: [id number] Date of Birth: [birth year]  Transition of Care [facility]) CM/SW Contact:    Sudie Erminio Deems, RN Phone Number: 04/19/2024, 10:40 AM   Clinical Narrative: Patient presented for chest pain-plan for home on Plavix . Patient has insurance and PCP. Patient reports no issues with obtaining medications. No home need identified at this time.    Transition of Care Asessment: Insurance and Status: Insurance coverage has been reviewed Patient has primary care physician: Yes Home environment has been reviewed: reviewed Prior level of function:: independent Prior/Current Home Services: No current home services Social Drivers of Health Review: SDOH reviewed no interventions necessary Readmission risk has been reviewed: Yes Transition of care needs: no transition of care needs at this time

## 2024-05-16 NOTE — Progress Notes (Unsigned)
 Cardiology Office Note   Date:  05/17/2024  ID:  Scott Young, DOB [birth year], MRN [id number] PCP: Rexanne Ingle, MD  [facility] Providers Cardiologist:  None     History of Present Illness Scott Young is a 73 y.o. male with history of nonobstructive CAD, moderate aortic stenosis, hypertension, hyperlipidemia, OSA, and first-degree AVB.      He had prior cardiac testing in 2017 that was reassuring. Cath in 2020 showed mild-moderate CAD and recommended medical therapy.  Repeat cath 03/2022 due to troponin bump of 26 with no progressive disease, but there was question of microvascular disease.  04/2022 echo showed EF of 60 to 65%, G1 DD, elevated LVEDP, mildly enlarged RV, moderate AAS, mild dilation of ascending aorta. She was last seen 07/05/2023 for progressively worsening chest discomfort that had been occurring for the last 3 to 4 months.  This had been noted to be increasingly worse from his baseline angina.  He has been woken up from his sleep with this pain and had an episode lasting several hours that was relieved with SL NTG.  Repeat cardiac catheterization 07/07/2023 noted of pulmonary hypertension or aortic stenosis.  Catheterization did not suggest microvascular disease with moderate coronary artery disease especially involving the diagonal and LAD with recommendations of medical therapy. 04/16/2024 was admitted to the hospital for chest pain that had been occurring for the past year.  EKG showed no signs of ischemia.  Cardiac biomarkers are negative.  Echo 04/18/2024 LVEF 60-65%, grade 1 DD, and no RWMA.  Discharge 04/19/2024 and recommended cardiology follow-up.    He presents today for hospital follow up for chest pain that has been occurring for one year. He does note some SOB, weakness and nausea when doing house work like vacuuming and mopping, and cleaning his car. He typically takes a rest for 10-15 min, and feels better. He has checked his BP and blood sugar after  these spells. He BP is typically in the 130's systolic, which he says is high for him. His blood sugar has been in the mid 90's during his spells. He has not felt this way in the gym on the stationary bike and lifting weights. He checks his blood pressure at home with readings in around 120/70. Denies CP, palpitations, lightheadedness, and dizziness. He has seen some dependant edema in his legs that is worse at night. His wife of 54 years has bought him compression socks in the past.    ROS: All systems are negative unless noted in the HPI.   Studies Reviewed     Cardiac Studies & Procedures   ______________________________________________________________________________________________ CARDIAC CATHETERIZATION  CARDIAC CATHETERIZATION 07/07/2023  Conclusion Images from the original result were not included. Right & Left Heart Catheterization 07/07/23: Hemodynamic data: Right heart catheterization suggests no evidence of pulmonary hypertension.  QP/QS 0.86 suggests left to right shunting but not significant. CO 6.89, CI 2.81, normal. LV 115/-3, EDP 8 mmHg.  Ao 100/59, mean 77 mmHg.  Peak to peak gradient across the aortic valve of 14.3 and mean gradient of 14.3, aortic valve area 2.20 cm.  No evidence of aortic stenosis.  Angiographic data: LM: Large-caliber vessel.  Mildly calcified. LAD: Large vessel, ends at the apex, gives origin to large D1 and D2.  LAD is diffusely diseased throughout with 1020% stenosis, there is a focal 60% stenosis in the midsegment.  D1 is diffusely diseased with tandem 30% stenosis.  D2 has a ostial 50% stenosis. RI: It is a moderate caliber vessel,  mild disease evident. LCx: Large OM 2 and 3.  OM 2 has a proximal long segment 30 to 40% stenosis. RCA: Large-caliber vessel.  Has mild disease in the midsegment and mildly calcified.  Functional study: Resting PD PA 0.91 and hyperemic FFR in the distal LAD was 0.81 suggesting at most intermediate significant  stenosis (normal >0.9 and >0.8 respectively)     Impression and recommendations:  IMR (Index Microcirculatory Resistance) although elevated at 40 (Normal <25), does not suggest microvascular or macrovascular disease of significance as CFR (coronary flow reserve) increased from 1.12 to 2.5 and hyperemic response was rapid with resting 1.12 and hyperemic response obtained at 0.46 suggesting normal endothelial function.  Normal right heart catheterization.  No evidence of pulmonary hypertension.  No evidence of aortic stenosis.    Recommendation: Moderate coronary artery disease especially involving the diagonal and LAD, would recommend medical therapy. Patient is pleased with the evaluation and will make lifestyle changes.  Findings Coronary Findings Diagnostic  Dominance: Right  Left Anterior Descending Prox LAD lesion is 40% stenosed. Prox LAD to Mid LAD lesion is 60% stenosed.  First Diagonal Branch Ost 1st Diag lesion is 30% stenosed. Ost 1st Diag to 1st Diag lesion is 35% stenosed. 1st Diag lesion is 30% stenosed.  Second Diagonal [employer] 2nd Diag to 2nd Diag lesion is 50% stenosed.  Ramus Intermedius Vessel is small. Ost Ramus to Ramus lesion is 30% stenosed.  Left Circumflex  First Obtuse Marginal Branch Vessel is small in size.  Second Obtuse Marginal Branch Vessel is large in size. Ost 2nd Mrg to 2nd Mrg lesion is 40% stenosed.  Right Coronary Artery Prox RCA to Mid RCA lesion is 30% stenosed.  Intervention  No interventions have been documented.   CARDIAC CATHETERIZATION  CARDIAC CATHETERIZATION 03/23/2022  Conclusion CONCLUSIONS: Diffuse atherosclerosis involving the proximal to mid LAD with tandem 40% stenoses in the proximal and mid segment.  Ostial diagonal #2 contains 70% stenosis. Left main is widely patent Circumflex contains diffuse atherosclerosis but no focal obstruction. RCA contains diffuse atherosclerosis but no focal  obstruction LAD suggests mild anteroapical hypokinesis.  EF 45 to 50%.  LVEDP 17 mmHg. 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  There may be some technical issues because the last shot at completion of the study demonstrated that the guide catheter had migrated out of the ostium of the left main.  This could influence both CFR and IMR.  RECOMMENDATIONS: No progression of epicardial disease Evidence of microvascular dysfunction with low CFR, normal IMR during hyperemia, and elevated IMR at rest.  These findings suggest microvascular dysfunction and appropriate therapy would include an ACE inhibitor/statin/and antiplatelet therapy.  May need to consider adding a nondihydropyridine calcium  channel blocker.  Findings Coronary Findings Diagnostic  Dominance: Right  Left Anterior Descending Prox LAD lesion is 40% stenosed. Prox LAD to Mid LAD lesion is 40% stenosed. Non-stenotic Mid LAD lesion.  First Diagonal Branch Ost 1st Diag to 1st Diag lesion is 35% stenosed.  Second Diagonal [employer] 2nd Diag to 2nd Diag lesion is 70% stenosed.  Ramus Intermedius Vessel is small. Ost Ramus to Ramus lesion is 30% stenosed.  Left Circumflex  First Obtuse Marginal Branch Vessel is small in size.  Second Obtuse Marginal Branch Vessel is large in size. Ost 2nd Mrg to 2nd Mrg lesion is 40% stenosed.  Right Coronary Artery Prox RCA to Mid RCA lesion is 30% stenosed.  Intervention  No interventions  have been documented.   STRESS TESTS  EXERCISE TOLERANCE TEST (ETT) 12/03/2015  Interpretation Summary  Blood pressure demonstrated a hypertensive response to exercise.  There was no ST segment deviation noted during stress.  Negative but non diagnostic ETT Patient only achieved 79% of age predicted maximum HR   ECHOCARDIOGRAM  ECHOCARDIOGRAM COMPLETE 04/18/2024  Narrative ECHOCARDIOGRAM REPORT    Patient Name:    Scott Young Date of Exam: 04/18/2024 Medical Rec #:  [id number]       Height:       72.0 in Accession #:    [id number]      Weight:       267.9 lb Date of Birth:  1951-04-24       BSA:          2.412 m Patient Age:    73 years        BP:           119/76 mmHg Patient Gender: M               HR:           62 bpm. Exam Location:  Inpatient  Procedure: 2D Echo (Both Spectral and Color Flow Doppler were utilized during procedure).  Indications:    aortic stenosis.  History:        Patient has prior history of Echocardiogram examinations, most recent 07/25/2023. Risk Factors:Sleep Apnea, Hypertension and Diabetes.  Sonographer:    Tinnie Barefoot RDCS Referring Phys: [id number] ABRAHAM [name]  IMPRESSIONS   1. Left ventricular ejection fraction, by estimation, is 60 to 65%. The left ventricle has normal function. The left ventricle has no regional wall motion abnormalities. There is mild concentric left ventricular hypertrophy. Left ventricular diastolic parameters are consistent with Grade I diastolic dysfunction (impaired relaxation). 2. Right ventricular systolic function is normal. The right ventricular size is normal. There is normal pulmonary artery systolic pressure. 3. Left atrial size was moderately dilated. 4. The mitral valve is normal in structure. No evidence of mitral valve regurgitation. No evidence of mitral stenosis. 5. The aortic valve is normal in structure. There is mild calcification of the aortic valve. There is mild thickening of the aortic valve. Aortic valve regurgitation is not visualized. Mild aortic valve stenosis. Aortic valve area, by VTI measures 1.54 cm. Aortic valve mean gradient measures 12.0 mmHg. Aortic valve Vmax measures 2.31 m/s. 6. The inferior vena cava is normal in size with greater than 50% respiratory variability, suggesting right atrial pressure of 3 mmHg.  FINDINGS Left Ventricle: Left ventricular ejection fraction, by estimation, is 60 to  65%. The left ventricle has normal function. The left ventricle has no regional wall motion abnormalities. The left ventricular internal cavity size was normal in size. There is mild concentric left ventricular hypertrophy. Left ventricular diastolic parameters are consistent with Grade I diastolic dysfunction (impaired relaxation).  Right Ventricle: The right ventricular size is normal. No increase in right ventricular wall thickness. Right ventricular systolic function is normal. There is normal pulmonary artery systolic pressure. The tricuspid regurgitant velocity is 1.56 m/s, and with an assumed right atrial pressure of 3 mmHg, the estimated right ventricular systolic pressure is 12.7 mmHg.  Left Atrium: Left atrial size was moderately dilated.  Right Atrium: Right atrial size was normal in size.  Pericardium: There is no evidence of pericardial effusion.  Mitral Valve: The mitral valve is normal in structure. No evidence of mitral valve regurgitation. No evidence of mitral valve stenosis.  Tricuspid  Valve: The tricuspid valve is normal in structure. Tricuspid valve regurgitation is trivial. No evidence of tricuspid stenosis.  Aortic Valve: The aortic valve is normal in structure. There is mild calcification of the aortic valve. There is mild thickening of the aortic valve. Aortic valve regurgitation is not visualized. Mild aortic stenosis is present. Aortic valve mean gradient measures 12.0 mmHg. Aortic valve peak gradient measures 21.3 mmHg. Aortic valve area, by VTI measures 1.54 cm.  Pulmonic Valve: The pulmonic valve was normal in structure. Pulmonic valve regurgitation is not visualized. No evidence of pulmonic stenosis.  Aorta: The aortic root is normal in size and structure.  Venous: The inferior vena cava is normal in size with greater than 50% respiratory variability, suggesting right atrial pressure of 3 mmHg.  IAS/Shunts: No atrial level shunt detected by color flow  Doppler.   LEFT VENTRICLE PLAX 2D LVIDd:         4.40 cm   Diastology LVIDs:         2.70 cm   LV e' medial:    5.33 cm/s LV PW:         1.10 cm   LV E/e' medial:  13.5 LV IVS:        1.10 cm   LV e' lateral:   6.42 cm/s LVOT diam:     2.30 cm   LV E/e' lateral: 11.2 LV SV:         73 LV SV Index:   30 LVOT Area:     4.15 cm   RIGHT VENTRICLE             IVC RV S prime:     11.20 cm/s  IVC diam: 1.60 cm TAPSE (M-mode): 1.5 cm  LEFT ATRIUM             Index LA diam:        3.50 cm 1.45 cm/m LA Vol (A2C):   49.7 ml 20.60 ml/m LA Vol (A4C):   84.1 ml 34.86 ml/m LA Biplane Vol: 70.4 ml 29.18 ml/m AORTIC VALVE AV Area (Vmax):    1.57 cm AV Area (Vmean):   1.51 cm AV Area (VTI):     1.54 cm AV Vmax:           231.00 cm/s AV Vmean:          157.000 cm/s AV VTI:            0.474 m AV Peak Grad:      21.3 mmHg AV Mean Grad:      12.0 mmHg LVOT Vmax:         87.40 cm/s LVOT Vmean:        57.150 cm/s LVOT VTI:          0.176 m LVOT/AV VTI ratio: 0.37  AORTA Ao Root diam: 3.50 cm Ao Asc diam:  3.50 cm  MITRAL VALVE               TRICUSPID VALVE MV Area (PHT): 2.54 cm    TR Peak grad:   9.7 mmHg MV Decel Time: 299 msec    TR Vmax:        156.00 cm/s MV E velocity: 72.10 cm/s MV A velocity: 91.50 cm/s  SHUNTS MV E/A ratio:  0.79        Systemic VTI:  0.18 m Systemic Diam: 2.30 cm  Annabella Scarce MD Electronically signed by Annabella Scarce MD Signature Date/Time: 04/18/2024/5:47:29 PM    Final  ______________________________________________________________________________________________      Risk Assessment/Calculations           Physical Exam VS:  BP 102/60 (BP Location: Right Arm, Patient Position: Sitting, Cuff Size: Large)   Pulse 74   Ht 6' (1.829 m)   Wt 275 lb (124.7 kg)   BMI 37.30 kg/m        Wt Readings from Last 3 Encounters:  05/17/24 275 lb (124.7 kg)  04/17/24 267 lb 13.7 oz (121.5 kg)  07/07/23 278 lb (126.1 kg)     GEN: Well nourished, well developed in no acute distress NECK: No JVD; No carotid bruits CARDIAC: RRR, grade 2/6 systolic murmur. No rubs, gallops RESPIRATORY:  Clear to auscultation without rales, wheezing or rhonchi  ABDOMEN: Soft, non-tender, non-distended EXTREMITIES:  No edema; No deformity   ASSESSMENT AND PLAN 1.  CAD- Today he feels overall well. His activity levels are slightly decreased with weakness, SOB, nausea, and diaphoresis with intense activity.  He does have mild dependant edema and is agreeable to wear compression stockings. Reports no chest pain, lightheadedness, dizziness, or palpitations. Continue aspirin  81 mg, atorvastatin  20 mg, clopidogrel  75 mg, Imdur  60 mg, and losartan  25 mg.   2.  HLD- Last LDL 32 on 03/12/24. Continue atorvastatin  20 mg and ezetimibe  10 mg.  3. OSA- Continue CPAP at night.   4. HTN- His BP today is 102/60. His BP at home around 120/70's. Continue losartan  25 mg and amlodipine  5 mg.   5. DM Type II- Last A1C 6.2 04/17/2024.Continue Trulicity 0.75 weekly. Followed by PCP.   6. CKD Stage III- Last creatine of 1.24.   7.Cardiac Murmur- Grade 2/6 systolic murmur on exam. Mild aortic stenosis noted on echo 04/18/2024. Aortic valve mean gradient of 12 mmHg. He does experience some SOB and weakness with intense activity. Will repeat echo in 9-12 months before follow up appointment.        Dispo: Follow up in 9-12 months with Dr. Ladona.   Signed, Mardy KATHEE Pizza, FNP

## 2024-05-17 ENCOUNTER — Encounter: Payer: Self-pay | Admitting: General Practice

## 2024-05-17 ENCOUNTER — Ambulatory Visit: Attending: General Practice

## 2024-05-17 VITALS — BP 102/60 | HR 74 | Ht 72.0 in | Wt 275.0 lb

## 2024-05-17 DIAGNOSIS — G4733 Obstructive sleep apnea (adult) (pediatric): Secondary | ICD-10-CM

## 2024-05-17 DIAGNOSIS — E785 Hyperlipidemia, unspecified: Secondary | ICD-10-CM

## 2024-05-17 DIAGNOSIS — R011 Cardiac murmur, unspecified: Secondary | ICD-10-CM | POA: Diagnosis not present

## 2024-05-17 DIAGNOSIS — E1169 Type 2 diabetes mellitus with other specified complication: Secondary | ICD-10-CM

## 2024-05-17 DIAGNOSIS — I2510 Atherosclerotic heart disease of native coronary artery without angina pectoris: Secondary | ICD-10-CM | POA: Diagnosis not present

## 2024-05-17 DIAGNOSIS — I10 Essential (primary) hypertension: Secondary | ICD-10-CM

## 2024-05-17 NOTE — Patient Instructions (Addendum)
 Medication Instructions:  NO CHANGES   Lab Work: NONE TO BE DONE TODAY.  Testing/Procedures: TO BE DONE BEFORE FOLLOW UP APPOINTMENT  Your physician has requested that you have an echocardiogram. Echocardiography is a painless test that uses sound waves to create images of your heart. It provides your doctor with information about the size and shape of your heart and how well your heart's chambers and valves are working. This procedure takes approximately one hour. There are no restrictions for this procedure. Please do NOT wear cologne, perfume, aftershave, or lotions (deodorant is allowed). Please arrive 15 minutes prior to your appointment time.  Please note: We ask at that you not bring children with you during ultrasound (echo/ vascular) testing. Due to room size and safety concerns, children are not allowed in the ultrasound rooms during exams. Our front office staff cannot provide observation of children in our lobby area while testing is being conducted. An adult accompanying a patient to their appointment will only be allowed in the ultrasound room at the discretion of the ultrasound technician under special circumstances. We apologize for any inconvenience.    Follow-Up: At [facility], you and your health needs are our priority.  As part of our continuing mission to provide you with exceptional heart care, our providers are all part of one team.  This team includes your primary Cardiologist (physician) and Advanced Practice Providers or APPs (Physician Assistants and Nurse Practitioners) who all work together to provide you with the care you need, when you need it.   Your next appointment:   9-12 MONTHS OR AS NEEDED  Provider:   DR. GORDY BERGAMO, MD

## 2024-06-11 ENCOUNTER — Other Ambulatory Visit: Payer: Self-pay | Admitting: Physician Assistant

## 2024-06-11 DIAGNOSIS — I10 Essential (primary) hypertension: Secondary | ICD-10-CM

## 2024-06-12 NOTE — Telephone Encounter (Signed)
 Amlodipine  5mg  refill  Dx-HTN Last OV- 05/17/24 with Mardy Pizza, FNP  Last OV states HTN- His BP today is 102/60. His BP at home around 120/70's. Continue losartan  25 mg and amlodipine  5 mg.

## 2024-06-27 ENCOUNTER — Other Ambulatory Visit: Payer: Self-pay | Admitting: Physician Assistant

## 2024-06-27 ENCOUNTER — Other Ambulatory Visit: Payer: Self-pay | Admitting: Behavioral Health

## 2024-06-27 DIAGNOSIS — F32A Depression, unspecified: Secondary | ICD-10-CM

## 2024-07-11 NOTE — Progress Notes (Signed)
 Scott Young                                          MRN: [id number]   07/11/2024   The [facility] Quality Team Specialist reviewed this patient medical record for the purposes of chart review for care gap closure. The following were reviewed: abstraction for care gap closure-glycemic status assessment.    [facility] Quality Team

## 2024-08-03 ENCOUNTER — Ambulatory Visit: Payer: Medicare Other | Admitting: Behavioral Health

## 2025-01-23 ENCOUNTER — Ambulatory Visit (HOSPITAL_COMMUNITY)
